# Patient Record
Sex: Female | Born: 1985 | Hispanic: No | Marital: Single | Smoking: Never smoker
Health system: Southern US, Community
[De-identification: ages and names within clinical notes are randomized; demographics above are authoritative.]

## PROBLEM LIST (undated history)

## (undated) ENCOUNTER — Inpatient Hospital Stay (HOSPITAL_COMMUNITY): Payer: Self-pay

## (undated) DIAGNOSIS — A499 Bacterial infection, unspecified: Secondary | ICD-10-CM

## (undated) DIAGNOSIS — N390 Urinary tract infection, site not specified: Secondary | ICD-10-CM

## (undated) DIAGNOSIS — O34219 Maternal care for unspecified type scar from previous cesarean delivery: Secondary | ICD-10-CM

## (undated) DIAGNOSIS — K219 Gastro-esophageal reflux disease without esophagitis: Secondary | ICD-10-CM

## (undated) DIAGNOSIS — D649 Anemia, unspecified: Secondary | ICD-10-CM

## (undated) DIAGNOSIS — F329 Major depressive disorder, single episode, unspecified: Secondary | ICD-10-CM

## (undated) DIAGNOSIS — O0991 Supervision of high risk pregnancy, unspecified, first trimester: Secondary | ICD-10-CM

## (undated) DIAGNOSIS — O358XX0 Maternal care for other (suspected) fetal abnormality and damage, not applicable or unspecified: Secondary | ICD-10-CM

## (undated) DIAGNOSIS — C801 Malignant (primary) neoplasm, unspecified: Secondary | ICD-10-CM

## (undated) DIAGNOSIS — R51 Headache: Secondary | ICD-10-CM

## (undated) DIAGNOSIS — E669 Obesity, unspecified: Secondary | ICD-10-CM

## (undated) DIAGNOSIS — T8859XA Other complications of anesthesia, initial encounter: Secondary | ICD-10-CM

## (undated) DIAGNOSIS — T4145XA Adverse effect of unspecified anesthetic, initial encounter: Secondary | ICD-10-CM

## (undated) DIAGNOSIS — I499 Cardiac arrhythmia, unspecified: Secondary | ICD-10-CM

## (undated) DIAGNOSIS — F419 Anxiety disorder, unspecified: Secondary | ICD-10-CM

## (undated) DIAGNOSIS — N189 Chronic kidney disease, unspecified: Secondary | ICD-10-CM

## (undated) DIAGNOSIS — O2620 Pregnancy care for patient with recurrent pregnancy loss, unspecified trimester: Secondary | ICD-10-CM

## (undated) DIAGNOSIS — F32A Depression, unspecified: Secondary | ICD-10-CM

## (undated) DIAGNOSIS — O35BXX0 Maternal care for other (suspected) fetal abnormality and damage, fetal cardiac anomalies, not applicable or unspecified: Secondary | ICD-10-CM

## (undated) DIAGNOSIS — N912 Amenorrhea, unspecified: Secondary | ICD-10-CM

## (undated) DIAGNOSIS — N3941 Urge incontinence: Secondary | ICD-10-CM

## (undated) DIAGNOSIS — O09299 Supervision of pregnancy with other poor reproductive or obstetric history, unspecified trimester: Secondary | ICD-10-CM

## (undated) DIAGNOSIS — R0602 Shortness of breath: Secondary | ICD-10-CM

## (undated) DIAGNOSIS — R000 Tachycardia, unspecified: Secondary | ICD-10-CM

## (undated) DIAGNOSIS — O99210 Obesity complicating pregnancy, unspecified trimester: Secondary | ICD-10-CM

## (undated) DIAGNOSIS — R590 Localized enlarged lymph nodes: Secondary | ICD-10-CM

## (undated) DIAGNOSIS — Z793 Long term (current) use of hormonal contraceptives: Secondary | ICD-10-CM

## (undated) HISTORY — DX: Localized enlarged lymph nodes: R59.0

## (undated) HISTORY — DX: Supervision of pregnancy with other poor reproductive or obstetric history, unspecified trimester: O09.299

## (undated) HISTORY — DX: Maternal care for other (suspected) fetal abnormality and damage, fetal cardiac anomalies, not applicable or unspecified: O35.BXX0

## (undated) HISTORY — DX: Supervision of high risk pregnancy, unspecified, first trimester: O09.91

## (undated) HISTORY — DX: Maternal care for unspecified type scar from previous cesarean delivery: O34.219

## (undated) HISTORY — PX: HERNIA REPAIR: SHX51

## (undated) HISTORY — PX: BARIATRIC SURGERY: SHX1103

## (undated) HISTORY — DX: Maternal care for other (suspected) fetal abnormality and damage, not applicable or unspecified: O35.8XX0

## (undated) HISTORY — DX: Amenorrhea, unspecified: N91.2

## (undated) HISTORY — DX: Obesity, unspecified: E66.9

## (undated) HISTORY — DX: Tachycardia, unspecified: R00.0

## (undated) HISTORY — DX: Long term (current) use of hormonal contraceptives: Z79.3

## (undated) HISTORY — DX: Urinary tract infection, site not specified: N39.0

## (undated) HISTORY — DX: Urge incontinence: N39.41

## (undated) HISTORY — DX: Obesity complicating pregnancy, unspecified trimester: O99.210

## (undated) HISTORY — DX: Pregnancy care for patient with recurrent pregnancy loss, unspecified trimester: O26.20

## (undated) HISTORY — PX: ABDOMINOPLASTY: SUR9

---
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
## 2003-04-16 ENCOUNTER — Encounter: Admission: RE | Admit: 2003-04-16 | Discharge: 2003-04-16 | Payer: Self-pay | Admitting: Family Medicine

## 2003-05-10 ENCOUNTER — Encounter (INDEPENDENT_AMBULATORY_CARE_PROVIDER_SITE_OTHER): Payer: Self-pay | Admitting: *Deleted

## 2003-06-11 ENCOUNTER — Encounter: Admission: RE | Admit: 2003-06-11 | Discharge: 2003-06-11 | Payer: Self-pay | Admitting: Family Medicine

## 2003-07-04 ENCOUNTER — Encounter: Admission: RE | Admit: 2003-07-04 | Discharge: 2003-07-04 | Payer: Self-pay | Admitting: Family Medicine

## 2003-10-03 ENCOUNTER — Encounter: Admission: RE | Admit: 2003-10-03 | Discharge: 2003-10-03 | Payer: Self-pay | Admitting: Sports Medicine

## 2003-10-03 ENCOUNTER — Ambulatory Visit: Payer: Self-pay | Admitting: *Deleted

## 2003-11-15 ENCOUNTER — Ambulatory Visit: Payer: Self-pay | Admitting: Sports Medicine

## 2003-12-23 ENCOUNTER — Ambulatory Visit: Payer: Self-pay | Admitting: Family Medicine

## 2003-12-30 ENCOUNTER — Ambulatory Visit: Payer: Self-pay | Admitting: Sports Medicine

## 2004-01-04 ENCOUNTER — Emergency Department (HOSPITAL_COMMUNITY): Admission: EM | Admit: 2004-01-04 | Discharge: 2004-01-04 | Payer: Self-pay | Admitting: Emergency Medicine

## 2004-01-08 ENCOUNTER — Ambulatory Visit: Payer: Self-pay | Admitting: Family Medicine

## 2004-01-08 ENCOUNTER — Ambulatory Visit (HOSPITAL_COMMUNITY): Admission: RE | Admit: 2004-01-08 | Discharge: 2004-01-08 | Payer: Self-pay | Admitting: Sports Medicine

## 2004-04-16 ENCOUNTER — Ambulatory Visit: Payer: Self-pay | Admitting: Sports Medicine

## 2004-08-27 ENCOUNTER — Ambulatory Visit: Payer: Self-pay | Admitting: Family Medicine

## 2004-09-07 ENCOUNTER — Ambulatory Visit: Payer: Self-pay | Admitting: Family Medicine

## 2004-11-05 ENCOUNTER — Ambulatory Visit: Payer: Self-pay | Admitting: Family Medicine

## 2004-11-09 ENCOUNTER — Ambulatory Visit: Payer: Self-pay | Admitting: Family Medicine

## 2004-12-25 ENCOUNTER — Ambulatory Visit: Payer: Self-pay | Admitting: Family Medicine

## 2005-03-10 ENCOUNTER — Ambulatory Visit: Payer: Self-pay | Admitting: Family Medicine

## 2005-04-02 ENCOUNTER — Ambulatory Visit: Payer: Self-pay | Admitting: Family Medicine

## 2005-08-24 ENCOUNTER — Ambulatory Visit: Payer: Self-pay | Admitting: Family Medicine

## 2005-08-26 ENCOUNTER — Emergency Department (HOSPITAL_COMMUNITY): Admission: EM | Admit: 2005-08-26 | Discharge: 2005-08-26 | Payer: Self-pay | Admitting: Emergency Medicine

## 2005-09-13 ENCOUNTER — Ambulatory Visit: Payer: Self-pay | Admitting: Family Medicine

## 2006-04-07 DIAGNOSIS — G43009 Migraine without aura, not intractable, without status migrainosus: Secondary | ICD-10-CM

## 2006-04-07 DIAGNOSIS — Z6841 Body Mass Index (BMI) 40.0 and over, adult: Secondary | ICD-10-CM | POA: Insufficient documentation

## 2006-04-08 ENCOUNTER — Encounter (INDEPENDENT_AMBULATORY_CARE_PROVIDER_SITE_OTHER): Payer: Self-pay | Admitting: *Deleted

## 2006-09-09 ENCOUNTER — Telehealth: Payer: Self-pay | Admitting: *Deleted

## 2006-09-09 ENCOUNTER — Ambulatory Visit: Payer: Self-pay | Admitting: Family Medicine

## 2006-10-22 ENCOUNTER — Emergency Department (HOSPITAL_COMMUNITY): Admission: EM | Admit: 2006-10-22 | Discharge: 2006-10-23 | Payer: Self-pay | Admitting: Emergency Medicine

## 2006-10-24 ENCOUNTER — Telehealth (INDEPENDENT_AMBULATORY_CARE_PROVIDER_SITE_OTHER): Payer: Self-pay | Admitting: *Deleted

## 2006-10-24 ENCOUNTER — Ambulatory Visit: Payer: Self-pay | Admitting: Family Medicine

## 2006-10-27 ENCOUNTER — Ambulatory Visit: Payer: Self-pay | Admitting: Family Medicine

## 2006-10-27 ENCOUNTER — Telehealth (INDEPENDENT_AMBULATORY_CARE_PROVIDER_SITE_OTHER): Payer: Self-pay | Admitting: *Deleted

## 2006-12-14 ENCOUNTER — Ambulatory Visit: Payer: Self-pay | Admitting: Family Medicine

## 2006-12-14 ENCOUNTER — Encounter (INDEPENDENT_AMBULATORY_CARE_PROVIDER_SITE_OTHER): Payer: Self-pay | Admitting: Family Medicine

## 2006-12-14 LAB — CONVERTED CEMR LAB
ALT: 16 units/L (ref 0–35)
Albumin: 4 g/dL (ref 3.5–5.2)
Calcium: 9.3 mg/dL (ref 8.4–10.5)
Creatinine, Ser: 0.72 mg/dL (ref 0.40–1.20)
Glucose, Bld: 78 mg/dL (ref 70–99)
MCV: 84.9 fL (ref 78.0–100.0)
RDW: 13.8 % (ref 11.5–14.0)
Sodium: 139 meq/L (ref 135–145)
WBC: 8.3 10*3/uL (ref 4.0–10.5)

## 2006-12-15 ENCOUNTER — Encounter (INDEPENDENT_AMBULATORY_CARE_PROVIDER_SITE_OTHER): Payer: Self-pay | Admitting: Family Medicine

## 2007-08-16 ENCOUNTER — Ambulatory Visit: Payer: Self-pay | Admitting: Family Medicine

## 2007-08-16 ENCOUNTER — Encounter: Payer: Self-pay | Admitting: *Deleted

## 2007-09-05 ENCOUNTER — Telehealth: Payer: Self-pay | Admitting: *Deleted

## 2007-09-05 ENCOUNTER — Ambulatory Visit: Payer: Self-pay | Admitting: Family Medicine

## 2007-11-24 ENCOUNTER — Encounter: Payer: Self-pay | Admitting: *Deleted

## 2007-12-01 ENCOUNTER — Encounter: Payer: Self-pay | Admitting: Family Medicine

## 2007-12-01 ENCOUNTER — Ambulatory Visit: Payer: Self-pay | Admitting: Family Medicine

## 2007-12-04 ENCOUNTER — Encounter: Payer: Self-pay | Admitting: Family Medicine

## 2007-12-04 ENCOUNTER — Telehealth (INDEPENDENT_AMBULATORY_CARE_PROVIDER_SITE_OTHER): Payer: Self-pay | Admitting: *Deleted

## 2007-12-04 LAB — CONVERTED CEMR LAB
Chlamydia, DNA Probe: NEGATIVE
GC Probe Amp, Genital: NEGATIVE

## 2007-12-06 ENCOUNTER — Encounter: Payer: Self-pay | Admitting: Family Medicine

## 2007-12-22 ENCOUNTER — Ambulatory Visit: Payer: Self-pay | Admitting: Family Medicine

## 2008-01-02 ENCOUNTER — Encounter: Payer: Self-pay | Admitting: Family Medicine

## 2008-01-03 ENCOUNTER — Encounter: Payer: Self-pay | Admitting: *Deleted

## 2008-01-08 ENCOUNTER — Encounter: Payer: Self-pay | Admitting: Family Medicine

## 2008-01-22 ENCOUNTER — Telehealth: Payer: Self-pay | Admitting: Family Medicine

## 2008-01-29 ENCOUNTER — Telehealth: Payer: Self-pay | Admitting: *Deleted

## 2008-01-29 ENCOUNTER — Encounter: Payer: Self-pay | Admitting: Family Medicine

## 2008-01-29 ENCOUNTER — Ambulatory Visit: Payer: Self-pay | Admitting: Family Medicine

## 2008-04-15 ENCOUNTER — Telehealth: Payer: Self-pay | Admitting: *Deleted

## 2008-05-17 ENCOUNTER — Ambulatory Visit: Payer: Self-pay | Admitting: Family Medicine

## 2008-06-24 ENCOUNTER — Emergency Department: Payer: Self-pay | Admitting: Emergency Medicine

## 2009-01-27 ENCOUNTER — Encounter: Payer: Self-pay | Admitting: Sports Medicine

## 2009-01-27 ENCOUNTER — Ambulatory Visit: Payer: Self-pay | Admitting: Family Medicine

## 2009-01-27 LAB — CONVERTED CEMR LAB
Antibody Screen: NEGATIVE
Basophils Absolute: 0 K/uL (ref 0.0–0.1)
Basophils Relative: 0 % (ref 0–1)
Eosinophils Absolute: 0.2 K/uL (ref 0.0–0.7)
Eosinophils Relative: 2 % (ref 0–5)
HCT: 41.5 % (ref 36.0–46.0)
Hemoglobin: 13.5 g/dL (ref 12.0–15.0)
Hepatitis B Surface Ag: NEGATIVE
Lymphocytes Relative: 17 % (ref 12–46)
Lymphs Abs: 1.8 K/uL (ref 0.7–4.0)
MCHC: 32.5 g/dL (ref 30.0–36.0)
MCV: 84.2 fL (ref 78.0–100.0)
Monocytes Absolute: 0.6 10*3/uL (ref 0.1–1.0)
Monocytes Relative: 5 % (ref 3–12)
Neutro Abs: 8.5 K/uL — ABNORMAL HIGH (ref 1.7–7.7)
Neutrophils Relative %: 77 % (ref 43–77)
Platelets: 243 K/uL (ref 150–400)
RBC: 4.93 M/uL (ref 3.87–5.11)
RDW: 14.5 % (ref 11.5–15.5)
Rh Type: POSITIVE
Rubella: 51.2 intl units/mL — ABNORMAL HIGH
Sickle Cell Screen: NEGATIVE
WBC: 11.1 10*3/microliter — ABNORMAL HIGH (ref 4.0–10.5)

## 2009-02-03 ENCOUNTER — Ambulatory Visit (HOSPITAL_COMMUNITY): Admission: RE | Admit: 2009-02-03 | Discharge: 2009-02-03 | Payer: Self-pay | Admitting: Family Medicine

## 2009-02-03 ENCOUNTER — Encounter: Payer: Self-pay | Admitting: Sports Medicine

## 2009-02-03 ENCOUNTER — Ambulatory Visit: Payer: Self-pay | Admitting: Family Medicine

## 2009-02-03 ENCOUNTER — Other Ambulatory Visit: Admission: RE | Admit: 2009-02-03 | Discharge: 2009-02-03 | Payer: Self-pay | Admitting: Sports Medicine

## 2009-02-03 LAB — CONVERTED CEMR LAB
Chlamydia, DNA Probe: NEGATIVE
GC Probe Amp, Genital: NEGATIVE
TSH: 0.868 microintl units/mL (ref 0.350–4.500)

## 2009-02-05 ENCOUNTER — Telehealth: Payer: Self-pay | Admitting: Sports Medicine

## 2009-02-05 ENCOUNTER — Encounter: Payer: Self-pay | Admitting: Sports Medicine

## 2009-02-13 ENCOUNTER — Encounter: Payer: Self-pay | Admitting: Sports Medicine

## 2009-02-13 ENCOUNTER — Ambulatory Visit: Payer: Self-pay | Admitting: Obstetrics & Gynecology

## 2009-03-08 ENCOUNTER — Emergency Department (HOSPITAL_COMMUNITY): Admission: EM | Admit: 2009-03-08 | Discharge: 2009-03-08 | Payer: Self-pay | Admitting: Emergency Medicine

## 2009-03-14 ENCOUNTER — Encounter: Payer: Self-pay | Admitting: Sports Medicine

## 2009-03-14 ENCOUNTER — Ambulatory Visit: Payer: Self-pay | Admitting: Family Medicine

## 2009-03-14 LAB — CONVERTED CEMR LAB
Bilirubin Urine: NEGATIVE
Blood in Urine, dipstick: NEGATIVE
Glucose, Urine, Semiquant: NEGATIVE
Ketones, urine, test strip: NEGATIVE
Nitrite: NEGATIVE
Protein, U semiquant: NEGATIVE
Specific Gravity, Urine: 1.02
Urobilinogen, UA: 0.2
WBC Urine, dipstick: NEGATIVE
pH: 7.5

## 2009-03-27 ENCOUNTER — Ambulatory Visit: Payer: Self-pay | Admitting: Family Medicine

## 2009-03-27 LAB — CONVERTED CEMR LAB
Bilirubin Urine: NEGATIVE
Blood in Urine, dipstick: NEGATIVE
Glucose, Urine, Semiquant: NEGATIVE
Ketones, urine, test strip: NEGATIVE
Nitrite: NEGATIVE
Protein, U semiquant: NEGATIVE
Specific Gravity, Urine: 1.015
Urobilinogen, UA: 0.2
WBC Urine, dipstick: NEGATIVE
pH: 6.5

## 2009-04-09 ENCOUNTER — Encounter: Payer: Self-pay | Admitting: Sports Medicine

## 2009-04-09 ENCOUNTER — Ambulatory Visit: Payer: Self-pay | Admitting: Family Medicine

## 2009-04-09 DIAGNOSIS — N390 Urinary tract infection, site not specified: Secondary | ICD-10-CM | POA: Insufficient documentation

## 2009-04-09 LAB — CONVERTED CEMR LAB
ALT: 65 units/L — ABNORMAL HIGH (ref 0–35)
AST: 36 U/L (ref 0–37)
Albumin: 3.1 g/dL — ABNORMAL LOW (ref 3.5–5.2)
Alkaline Phosphatase: 84 U/L (ref 39–117)
BUN: 10 mg/dL (ref 6–23)
CO2: 23 meq/L (ref 19–32)
Calcium: 8.5 mg/dL (ref 8.4–10.5)
Chloride: 103 meq/L (ref 96–112)
Creatinine, Ser: 0.6 mg/dL (ref 0.40–1.20)
Glucose, Bld: 79 mg/dL (ref 70–99)
HCT: 36.6 % (ref 36.0–46.0)
Hemoglobin: 12.2 g/dL (ref 12.0–15.0)
MCHC: 33.3 g/dL (ref 30.0–36.0)
MCV: 85.1 fL (ref 78.0–100.0)
Platelets: 193 K/uL (ref 150–400)
Potassium: 3.8 meq/L (ref 3.5–5.3)
RBC: 4.3 M/uL (ref 3.87–5.11)
RDW: 14.2 % (ref 11.5–15.5)
Sodium: 136 meq/L (ref 135–145)
Total Bilirubin: 0.3 mg/dL (ref 0.3–1.2)
Total Protein: 6.6 g/dL (ref 6.0–8.3)
WBC: 10.2 10*3/uL (ref 4.0–10.5)

## 2009-04-16 ENCOUNTER — Ambulatory Visit (HOSPITAL_COMMUNITY): Admission: RE | Admit: 2009-04-16 | Discharge: 2009-04-16 | Payer: Self-pay | Admitting: Sports Medicine

## 2009-04-16 ENCOUNTER — Encounter: Payer: Self-pay | Admitting: Sports Medicine

## 2009-05-08 ENCOUNTER — Ambulatory Visit: Payer: Self-pay | Admitting: Family Medicine

## 2009-05-14 ENCOUNTER — Ambulatory Visit: Payer: Self-pay | Admitting: Diagnostic Radiology

## 2009-05-14 ENCOUNTER — Emergency Department (HOSPITAL_BASED_OUTPATIENT_CLINIC_OR_DEPARTMENT_OTHER): Admission: EM | Admit: 2009-05-14 | Discharge: 2009-05-14 | Payer: Self-pay | Admitting: Emergency Medicine

## 2009-05-19 ENCOUNTER — Encounter: Payer: Self-pay | Admitting: Sports Medicine

## 2009-05-19 ENCOUNTER — Ambulatory Visit: Payer: Self-pay | Admitting: Family Medicine

## 2009-05-19 LAB — CONVERTED CEMR LAB
Bilirubin Urine: NEGATIVE
Blood in Urine, dipstick: NEGATIVE
Glucose, Urine, Semiquant: NEGATIVE
Ketones, urine, test strip: NEGATIVE
Nitrite: NEGATIVE
Protein, U semiquant: NEGATIVE
Specific Gravity, Urine: 1.02
Urobilinogen, UA: 0.2
pH: 7

## 2009-05-26 ENCOUNTER — Encounter: Payer: Self-pay | Admitting: Sports Medicine

## 2009-05-29 ENCOUNTER — Ambulatory Visit: Payer: Self-pay | Admitting: Family Medicine

## 2009-05-31 ENCOUNTER — Emergency Department (HOSPITAL_COMMUNITY): Admission: EM | Admit: 2009-05-31 | Discharge: 2009-05-31 | Payer: Self-pay | Admitting: Family Medicine

## 2009-06-11 ENCOUNTER — Ambulatory Visit: Payer: Self-pay | Admitting: Family Medicine

## 2009-06-11 ENCOUNTER — Encounter: Payer: Self-pay | Admitting: Sports Medicine

## 2009-06-11 LAB — CONVERTED CEMR LAB
HCT: 36.2 % (ref 36.0–46.0)
Hemoglobin: 11.4 g/dL — ABNORMAL LOW (ref 12.0–15.0)
MCHC: 31.5 g/dL (ref 30.0–36.0)
MCV: 83.2 fL (ref 78.0–100.0)
Platelets: 201 K/uL (ref 150–400)
RBC: 4.35 M/uL (ref 3.87–5.11)
RDW: 13.7 % (ref 11.5–15.5)
WBC: 10.5 10*3/microliter (ref 4.0–10.5)

## 2009-06-12 ENCOUNTER — Encounter: Payer: Self-pay | Admitting: Sports Medicine

## 2009-06-12 ENCOUNTER — Ambulatory Visit (HOSPITAL_COMMUNITY): Admission: RE | Admit: 2009-06-12 | Discharge: 2009-06-12 | Payer: Self-pay | Admitting: Family Medicine

## 2009-06-27 ENCOUNTER — Telehealth: Payer: Self-pay | Admitting: Sports Medicine

## 2009-06-27 ENCOUNTER — Inpatient Hospital Stay (HOSPITAL_COMMUNITY): Admission: AD | Admit: 2009-06-27 | Discharge: 2009-06-27 | Payer: Self-pay | Admitting: Obstetrics & Gynecology

## 2009-06-27 ENCOUNTER — Ambulatory Visit: Payer: Self-pay | Admitting: Family Medicine

## 2009-06-30 ENCOUNTER — Telehealth: Payer: Self-pay | Admitting: Sports Medicine

## 2009-07-01 ENCOUNTER — Telehealth: Payer: Self-pay | Admitting: Sports Medicine

## 2009-07-04 ENCOUNTER — Ambulatory Visit: Payer: Self-pay | Admitting: Family Medicine

## 2009-07-09 ENCOUNTER — Telehealth: Payer: Self-pay | Admitting: *Deleted

## 2009-07-18 ENCOUNTER — Ambulatory Visit: Payer: Self-pay | Admitting: Family Medicine

## 2009-07-21 ENCOUNTER — Telehealth: Payer: Self-pay | Admitting: *Deleted

## 2009-07-23 ENCOUNTER — Ambulatory Visit (HOSPITAL_COMMUNITY): Admission: RE | Admit: 2009-07-23 | Discharge: 2009-07-23 | Payer: Self-pay | Admitting: Family Medicine

## 2009-07-23 ENCOUNTER — Encounter: Payer: Self-pay | Admitting: Sports Medicine

## 2009-07-30 ENCOUNTER — Ambulatory Visit: Payer: Self-pay | Admitting: Obstetrics and Gynecology

## 2009-07-31 ENCOUNTER — Encounter: Payer: Self-pay | Admitting: Sports Medicine

## 2009-07-31 ENCOUNTER — Ambulatory Visit: Payer: Self-pay | Admitting: Family Medicine

## 2009-07-31 LAB — CONVERTED CEMR LAB
Chlamydia, DNA Probe: NEGATIVE
GC Probe Amp, Genital: NEGATIVE

## 2009-08-05 ENCOUNTER — Ambulatory Visit: Payer: Self-pay | Admitting: Family Medicine

## 2009-08-06 ENCOUNTER — Encounter: Payer: Self-pay | Admitting: Sports Medicine

## 2009-08-06 ENCOUNTER — Telehealth: Payer: Self-pay | Admitting: *Deleted

## 2009-08-12 ENCOUNTER — Telehealth: Payer: Self-pay | Admitting: Sports Medicine

## 2009-08-13 ENCOUNTER — Ambulatory Visit: Payer: Self-pay | Admitting: Family Medicine

## 2009-08-14 ENCOUNTER — Encounter: Payer: Self-pay | Admitting: Sports Medicine

## 2009-08-14 ENCOUNTER — Ambulatory Visit: Payer: Self-pay | Admitting: Family Medicine

## 2009-08-14 LAB — CONVERTED CEMR LAB
Bilirubin Urine: NEGATIVE
Blood in Urine, dipstick: NEGATIVE
Glucose, Urine, Semiquant: NEGATIVE
Ketones, urine, test strip: NEGATIVE
Nitrite: NEGATIVE
Protein, U semiquant: NEGATIVE
Specific Gravity, Urine: 1.025
Urobilinogen, UA: 1
pH: 6

## 2009-08-15 ENCOUNTER — Telehealth (INDEPENDENT_AMBULATORY_CARE_PROVIDER_SITE_OTHER): Payer: Self-pay | Admitting: Family Medicine

## 2009-08-21 ENCOUNTER — Ambulatory Visit: Payer: Self-pay | Admitting: Family Medicine

## 2009-08-25 ENCOUNTER — Inpatient Hospital Stay (HOSPITAL_COMMUNITY): Admission: AD | Admit: 2009-08-25 | Discharge: 2009-08-27 | Payer: Self-pay | Admitting: Obstetrics & Gynecology

## 2009-08-25 ENCOUNTER — Ambulatory Visit: Payer: Self-pay | Admitting: Family Medicine

## 2009-08-25 ENCOUNTER — Ambulatory Visit: Payer: Self-pay | Admitting: Obstetrics & Gynecology

## 2009-08-27 ENCOUNTER — Encounter: Payer: Self-pay | Admitting: Sports Medicine

## 2009-08-29 ENCOUNTER — Encounter: Payer: Self-pay | Admitting: Sports Medicine

## 2009-08-29 ENCOUNTER — Encounter: Admission: RE | Admit: 2009-08-29 | Discharge: 2009-09-28 | Payer: Self-pay | Admitting: Sports Medicine

## 2009-08-29 ENCOUNTER — Ambulatory Visit: Admission: RE | Admit: 2009-08-29 | Discharge: 2009-08-29 | Payer: Self-pay | Admitting: Sports Medicine

## 2009-08-29 ENCOUNTER — Telehealth: Payer: Self-pay | Admitting: Family Medicine

## 2009-09-01 ENCOUNTER — Ambulatory Visit: Admission: RE | Admit: 2009-09-01 | Discharge: 2009-09-01 | Payer: Self-pay | Admitting: Sports Medicine

## 2009-09-29 ENCOUNTER — Encounter: Admission: RE | Admit: 2009-09-29 | Discharge: 2009-10-29 | Payer: Self-pay | Admitting: Sports Medicine

## 2009-10-06 ENCOUNTER — Ambulatory Visit: Payer: Self-pay | Admitting: Family Medicine

## 2009-10-07 ENCOUNTER — Encounter: Payer: Self-pay | Admitting: Sports Medicine

## 2009-10-22 ENCOUNTER — Emergency Department (HOSPITAL_COMMUNITY): Admission: EM | Admit: 2009-10-22 | Discharge: 2009-10-22 | Payer: Self-pay | Admitting: Family Medicine

## 2009-10-27 ENCOUNTER — Ambulatory Visit: Payer: Self-pay | Admitting: Family Medicine

## 2009-10-27 ENCOUNTER — Encounter: Payer: Self-pay | Admitting: Sports Medicine

## 2009-10-27 LAB — CONVERTED CEMR LAB
Beta hcg, urine, semiquantitative: NEGATIVE
Bilirubin Urine: NEGATIVE
Glucose, Urine, Semiquant: NEGATIVE
Ketones, urine, test strip: NEGATIVE
Nitrite: NEGATIVE
Protein, U semiquant: NEGATIVE
Specific Gravity, Urine: 1.02
Urobilinogen, UA: 0.2
pH: 6

## 2009-10-30 ENCOUNTER — Encounter: Admission: RE | Admit: 2009-10-30 | Discharge: 2009-10-30 | Payer: Self-pay | Admitting: Sports Medicine

## 2009-11-13 ENCOUNTER — Ambulatory Visit: Payer: Self-pay | Admitting: Family Medicine

## 2009-11-30 ENCOUNTER — Encounter
Admission: RE | Admit: 2009-11-30 | Discharge: 2009-12-30 | Payer: Self-pay | Source: Home / Self Care | Admitting: Sports Medicine

## 2009-12-10 ENCOUNTER — Ambulatory Visit: Payer: Self-pay | Admitting: Obstetrics and Gynecology

## 2009-12-15 ENCOUNTER — Encounter: Payer: Self-pay | Admitting: Sports Medicine

## 2009-12-26 ENCOUNTER — Ambulatory Visit: Payer: Self-pay | Admitting: Family Medicine

## 2009-12-31 ENCOUNTER — Encounter
Admission: RE | Admit: 2009-12-31 | Discharge: 2010-01-30 | Payer: Self-pay | Source: Home / Self Care | Attending: Sports Medicine | Admitting: Sports Medicine

## 2010-01-15 ENCOUNTER — Ambulatory Visit (HOSPITAL_COMMUNITY): Admission: RE | Admit: 2010-01-15 | Payer: Self-pay | Source: Home / Self Care | Admitting: Obstetrics & Gynecology

## 2010-01-31 ENCOUNTER — Encounter
Admission: RE | Admit: 2010-01-31 | Discharge: 2010-03-02 | Payer: Self-pay | Source: Home / Self Care | Attending: Sports Medicine | Admitting: Sports Medicine

## 2010-02-08 HISTORY — PX: ANKLE SURGERY: SHX546

## 2010-03-01 ENCOUNTER — Encounter: Payer: Self-pay | Admitting: Family Medicine

## 2010-03-03 ENCOUNTER — Encounter
Admission: RE | Admit: 2010-03-03 | Discharge: 2010-03-10 | Payer: Self-pay | Source: Home / Self Care | Attending: Sports Medicine | Admitting: Sports Medicine

## 2010-03-12 NOTE — Assessment & Plan Note (Signed)
Summary: diarrhea & nausea/[place]/T   Vital Signs:  Patient profile:   25 year old female Height:      64 inches Weight:      344 pounds Temp:     98.2 degrees F oral Pulse rate:   90 / minute BP sitting:   122 / 78  (left arm) Cuff size:   large  Vitals Entered By: Tessie Fass CMA (August 13, 2009 9:01 AM) CC: nausea, vomiting, diarrhea, lower back and abdominal pain x 3 days Is Patient Diabetic? No Pain Assessment Patient in pain? yes     Location: lower back, abdomen Intensity: 4   Primary Care Provider:  Rodney Langton MD  CC:  nausea, vomiting, diarrhea, and lower back and abdominal pain x 3 days.  History of Present Illness: 15 F at 27 6/[redacted] weeks  EGA p/w   1) Nausea, vomiting and diarrhea: x 3 days. Emesis (non bloody, non bilious) 3 times per day when tries to eat or drink. Not relieved by phenergan (makes her sleepy). Loose stools 3 times per day (non bloody).  Able to keep down liquids, had potato last night, kept that down as well. Mild diffuse abdominal pain when has emesis. Reports +ve fetal movement, denies vaginal bleeding, contractions, vaginal discharge, dysuria, hematuria, focal abdominal pain, sick contacts, new foods, LE edema, headache, vision change. Has mild nausea right now but does not want any medications for this while here in the office. Reports that she cannot urinate at this time to provide a urine sample. 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   see prior meds for med rec.   Habits & Providers  Alcohol-Tobacco-Diet     Cigarette Packs/Day: n/a  Allergies (verified): No Known Drug Allergies  Review of Systems       as per HPI o/w negative   Physical Exam  General:  morbidly obese, pleasant, NAD  vitals reviewed.  Mouth:  moist membranes  Lungs:  Normal respiratory effort, chest expands symmetrically. Lungs are clear to auscultation, no crackles or wheezes. Heart:  Normal rate and regular rhythm. S1 and S2 normal without gallop, murmur,  click, rub or other extra sounds. Abdomen:  Obese, gravid, appropriate for EGA, non tender, +BS    Impression & Recommendations:  Problem # 1:  GASTROENTERITIS, VIRAL (ICD-008.8) Assessment New  Likely cause of symptoms. Advised regarding symptomatic care, red flags including pregnancy / labor precautions. Advised to push fluids. Will add Zofran for symptosm relief. Follow up with PCP on Thursday.   Orders: [facility]- Est Level  3 (16109)  Complete Medication List: 1)  Promethazine Hcl 25 Mg Tabs (Promethazine hcl) .... One three times a day as needed for nausea associated with migranes. 2)  Calcium 600/vitamin D 600-400 Mg-unit Chew (Calcium carbonate-vitamin d) .... One tablet by mouth two times a day 3)  Prenatal Vitamins 0.8 Mg Tabs (Prenatal multivit-min-fe-fa) .... One tab by mouth daily 4)  Cephalexin 250 Mg Tabs (Cephalexin) .... One tab by mouth daily until end of pregnancy 5)  Auralgan Soln (Antipyrine-benzocaine-polycos) .... 2 drops in painful ear qid. 6)  Acetaminophen 650 Mg Cr-tabs (Acetaminophen) .... One tab by mouth three times a day 7)  Ondansetron Hcl 4 Mg Tabs (Ondansetron hcl) .... One tab by mouth q4hrs as needed for nausea, vomiting  Patient Instructions: 1)  It was great to see you today!  2)  Come back for your regular appointment 3)  If you have vaginal bleeding, contractions that are consistent, or any other  concerns, please go to the MAU or the Clinic.  Every morning and every evening evaluate whether you think the baby is moving enough.  If not, then do kick counts as follows.  Sit in a quiet place and count each movement.  If you get to 5 baby movements in an hour you can stop.  If you have less than 5 movements, keep counting for another hour.  If you don't get 10 movements in 2 hours, you should go to the MAU or call the clinic. 4)  Stay well hydrated. 5)  If you develop fevers, worsening diarrhea or vomiting, come back to be seen.  Prescriptions: ONDANSETRON  HCL 4 MG TABS (ONDANSETRON HCL) one tab by mouth q4hrs as needed for nausea, vomiting  #15 x 1   Entered and Authorized by:   Bobby Rumpf  MD   Signed by:   Bobby Rumpf  MD on 08/13/2009   Method used:   Electronically to        [facility] [phone]* (retail)       [address]       [place], [place]  [address]       Ph: [phone]       Fax: [phone]   RxID:   [id number]      Flowsheet View for Follow-up Visit    Estimated weeks of       gestation:     36 6/7    Weight:     344    Blood pressure:   122 / 78    Hx headache?     No    Nausea/vomiting?   see above     Edema?     0    Bleeding?     no    Leakage/discharge?   no    Fetal activity:       yes    Labor symptoms?   no    Fundal height:      37.5    FHR:       125-135    Fetal position:      ??    Taking Vitamins?   Y    Smoking PPD:   n/a    Comment:     see above assessment    Next visit:     on Thursday at scheduled appointment    Resident:     Wallene Huh

## 2010-03-12 NOTE — Assessment & Plan Note (Signed)
Summary: OB/KH   Vital Signs:  Patient profile:   25 year old female Weight:      335.4 pounds Temp:     98.6 degrees F oral Pulse rate:   94 / minute Pulse rhythm:   regular BP sitting:   131 / 82  (left arm) Cuff size:   large  Vitals Entered By: Loralee Pacas CMA (Jun 11, 2009 1:35 PM) CC: ob  Comments pt went to [facility] for ear infection and the medication is not working for her   Primary Care Provider:  Rodney Langton MD  CC:  ob .  History of Present Illness: 04VW [id number] here for ROB visit. 27.6 weeks.  See flowsheet and A/P.  Habits & Providers  Alcohol-Tobacco-Diet     Cigarette Packs/Day: n/a  Current Medications (verified): 1)  Promethazine Hcl 25 Mg Tabs (Promethazine Hcl) .... One Three Times A Day As Needed For Nausea Associated With Migranes. 2)  Calcium 600/vitamin D 600-400 Mg-Unit Chew (Calcium Carbonate-Vitamin D) .... One Tablet By Mouth Two Times A Day 3)  Prenatal Vitamins 0.8 Mg Tabs (Prenatal Multivit-Min-Fe-Fa) .... One Tab By Mouth Daily 4)  Cephalexin 250 Mg Tabs (Cephalexin) .... One Tab By Mouth Daily Until End of Pregnancy 5)  Auralgan  Soln (Antipyrine-Benzocaine-Polycos) .... 2 Drops in Painful Ear Qid.  Allergies (verified): No Known Drug Allergies  Review of Systems       See HPI  Physical Exam  General:  Well-developed,well-nourished,in no acute distress; alert,appropriate and cooperative throughout examination Ears:  External ear exam shows no significant lesions or deformities.  Otoscopic examination reveals clear canals, tympanic membranes are intact bilaterally without bulging, retraction, inflammation or discharge. Hearing is grossly normal bilaterally. Abdomen:  Obese, gravid, appropriate for EGA.   Impression & Recommendations:  Problem # 1:  PREGNANCY WITH OTHER POOR REPRODUCTIVE HISTORY (ICD-V23.5) Assessment Unchanged 25yo [id number] at 27.6 weeks Initiated prenatal care at 10 weeks. Refused genetic screening. Best  EDC: 09/04/2009 by 9wk [place] Hb: 13.5  Plt:243 HIV: neg RPR: NR Hep B: neg Rubella: Immune Blood type: O pos  Ab: neg Sickle Screen: neg Dating [place]: Consistent with LMP. GC/Chlam/PAP: neg/neg/neg 1h Glucola: 98/108 Taking PNV. Anatomy scan female, normal. Fetal ECHO Normal  Referred to [facility] for 24wk stillborn, SABx2->needs [place] at 20, 28, 34 weeks.  Fetal ECHO after 20wk anatomy scan. Desires RLTCS, needs to be set up near term (30 wks).  Has [place] scheduled tomorrow.  Needs another at 34 weeks. CBC, HIV, RPR today.  RTC 2 weeks.  Orders: Glucose 1 hr-[facility] (82950) CBC-[facility] (47829) HIV-[facility] (56213-08657) RPR-[facility] (84696-29528) [facility]- Est  Level 4 (41324)  Problem # 2:  OTITIS EXTERNA, ACUTE, RIGHT (ICD-380.12) Assessment: Improved Seen at [facility] 4 days ago and dx AOE, rx antibiotic ear drops.  Resolved but still painful, auralgan for symptomatic relief.  Her updated medication list for this problem includes:    Auralgan Soln (Antipyrine-benzocaine-polycos) .Marland Kitchen... 2 drops in painful ear qid.  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  Refilled.  Her updated medication list for this problem includes:    Cephalexin 250 Mg Tabs (Cephalexin) ..... One tab by mouth daily until end of pregnancy  Orders: [facility]- Est  Level 4 (40102)  Complete Medication List: 1)  Promethazine Hcl 25 Mg Tabs (Promethazine hcl) .... One three times a day as needed for nausea associated with migranes. 2)  Calcium 600/vitamin D  600-400 Mg-unit Chew (Calcium carbonate-vitamin d) .... One tablet by mouth two times a day 3)  Prenatal Vitamins 0.8 Mg Tabs (Prenatal multivit-min-fe-fa) .... One tab by mouth daily 4)  Cephalexin 250 Mg Tabs (Cephalexin) .... One tab by mouth daily until end of pregnancy 5)  Auralgan Soln (Antipyrine-benzocaine-polycos) .... 2 drops in painful ear qid.  Patient Instructions: 1)  Great to  see you today, 2)  Bloodwork 3)  Call if you have any vaginal bleeding. 4)  Be sure to take your prenatal vitamins daily. 5)  Ultrasound tomorrow 6)  Refilled keflex 7)  Sent auralgan for ear. 8)  Come back to see me in 2 weeks. 9)  -Dr. Karie Schwalbe. Prescriptions: CEPHALEXIN 250 MG TABS (CEPHALEXIN) One tab by mouth daily until end of pregnancy  #90 x 0   Entered and Authorized by:   Rodney Langton MD   Signed by:   Rodney Langton MD on 06/11/2009   Method used:   Electronically to        [facility] [phone]* (retail)       [address]       [place], [place]  [address]       Ph: [phone]       Fax: [phone]   RxID:   [id number] AURALGAN  SOLN (ANTIPYRINE-BENZOCAINE-POLYCOS) 2 drops in painful ear QID.  #1 bottle x 0   Entered and Authorized by:   Rodney Langton MD   Signed by:   Rodney Langton MD on 06/11/2009   Method used:   Electronically to        [facility] [phone]* (retail)       [address]       [place], [place]  [address]       Ph: [phone]       Fax: [phone]   RxID:   [id number]        Flowsheet View for Follow-up Visit    Estimated weeks of       gestation:     27 6/7    Weight:     335.4    Blood pressure:   131 / 82    Hx headache?     No    Nausea/vomiting?   No    Edema?     0    Bleeding?     no    Leakage/discharge?   no    Fetal activity:       yes    Labor symptoms?   no    Fundal height:      29    FHR:       155    Fetal position:      ??    Taking Vitamins?   Y    Smoking PPD:   n/a    Comment:     Doing well, one episode of spotting that resolved.  [place] tomorrow.    Next visit:     2 wk    Resident:     Benjamin Stain    Preceptor:     Leveda Anna

## 2010-03-12 NOTE — Assessment & Plan Note (Signed)
Summary: cough,df   Vital Signs:  Patient profile:   25 year old female Height:      64 inches Weight:      326 pounds BMI:     56.16 Temp:     98.4 degrees F oral Pulse rate:   103 / minute BP sitting:   138 / 95  (right arm) Cuff size:   large  Vitals Entered By: Jimmy Footman, CMA (December 26, 2009 11:25 AM) CC: wet cough x3 weeks Is Patient Diabetic? No Pain Assessment Patient in pain? no      Comments robitussin---- not helping   Primary Care [place] Toure:  Rodney Langton MD  CC:  wet cough x3 weeks.  History of Present Illness: 25 yo female with cough.  Present 3 wks.  getting worse.  associated with post-tussive emesis occasionally.  Symptoms started with URI-type syndrome with ST 3 wks ago.  No HA, facial pain, ear pain, N/D/C, fevers/chills, rash, No sick contacts, no neck pain, no body aches, Pt has been using robitussin which doesn't help.  No SOB, no wheeze.  No throat clearing, no heartburn, no hoarse voice, not worse when laying flat or waking up in the morning, no sour brash, no epigastric pain.  Current Medications (verified): 1)  Calcium 600/vitamin D 600-400 Mg-Unit Chew (Calcium Carbonate-Vitamin D) .... One Tablet By Mouth Two Times A Day 2)  Ferrous Sulfate 325 (65 Fe) Mg Tbec (Ferrous Sulfate) .... One Tab By Mouth Two Times A Day 3)  Reglan 10 Mg Tabs (Metoclopramide Hcl) .... One Tab By Mouth Three Times A Day 4)  Depo-Provera 150 Mg/ml Susp (Medroxyprogesterone Acetate) .... Injected Im Q3 Months. 5)  Tessalon 200 Mg Caps (Benzonatate) .... One By Mouth Three Times A Day  Allergies (verified): No Known Drug Allergies  Review of Systems       See HPI  Physical Exam  General:  Well-developed,well-nourished,in no acute distress; alert,appropriate and cooperative throughout examination Head:  Normocephalic and atraumatic without obvious abnormalities. Eyes:  No corneal or conjunctival inflammation noted. EOMI. Perrl Ears:  External ear exam  shows no significant lesions or deformities.  Otoscopic examination reveals clear canals, tympanic membranes are intact bilaterally without bulging, retraction, inflammation or discharge. Hearing is grossly normal bilaterally. Nose:  External nasal examination shows no deformity or inflammation. Nasal mucosa are pink and moist without lesions or exudates. Mouth:  pharynx pink and moist, no erythema, no exudates, and no posterior lymphoid hypertrophy.  Some post-nasal drip present. Neck:  No deformities, masses, or tenderness noted. Lungs:  Normal respiratory effort, chest expands symmetrically. Lungs are clear to auscultation, no crackles or wheezes. Heart:  Normal rate and regular rhythm. S1 and S2 normal without gallop, murmur, click, rub or other extra sounds.   Impression & Recommendations:  Problem # 1:  DRY COUGH (ICD-786.2) Assessment New Likely post-infectious cough which can last 4 weeks. Symptoms not suggestive of GERD. Changed antitussive to Tessalon 200 three times a day. Hydration. Vicks. Humidifier. RTC if not starting to get better in 1-2 weeks.  Orders: [facility]- Est Level  3 (16109)  Complete Medication List: 1)  Calcium 600/vitamin D 600-400 Mg-unit Chew (Calcium carbonate-vitamin d) .... One tablet by mouth two times a day 2)  Ferrous Sulfate 325 (65 Fe) Mg Tbec (Ferrous sulfate) .... One tab by mouth two times a day 3)  Reglan 10 Mg Tabs (Metoclopramide hcl) .... One tab by mouth three times a day 4)  Depo-provera 150 Mg/ml Susp (Medroxyprogesterone acetate) .Marland KitchenMarland KitchenMarland Kitchen  Injected im q3 months. 5)  Tessalon 200 Mg Caps (Benzonatate) .... One by mouth three times a day  Patient Instructions: 1)  Post-infectious cough. 2)  Try the tessalon perles 200mg  three times a day. 3)  If no better in 1-2 weeks then come back to see me and we will discuss other causes of chronic cough. 4)  Keep well hydrated so the lining of your throat doesn't dry out. 5)  -Dr.  Karie Schwalbe. Prescriptions: TESSALON 200 MG CAPS (BENZONATATE) One by mouth three times a day  #60 x 0   Entered and Authorized by:   Rodney Langton MD   Signed by:   Rodney Langton MD on 12/26/2009   Method used:   Print then Give to Patient   RxID:   [id number]    Orders Added: 1)  [facility] Est Level  3 [13086]

## 2010-03-12 NOTE — Miscellaneous (Signed)
Summary: call form pharmacy  Clinical Lists Changes call form pharmacy Dr.Thekkekandam left message for refill on percocet on their voicemail. RX must be written. will forward to MD. Theresia Lo RN  August 27, 2009 11:45 AM  Will print out and fax. Rodney Langton MD  August 27, 2009 11:59 AM     Prescriptions: PERCOCET 5-325 MG TABS (OXYCODONE-ACETAMINOPHEN) One tab by mouth q6h as needed for pain.  #40 x 0   Entered and Authorized by:   Rodney Langton MD   Signed by:   Rodney Langton MD on 08/27/2009   Method used:   Reprint   RxID:   [id number]

## 2010-03-12 NOTE — Miscellaneous (Signed)
  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

## 2010-03-12 NOTE — Assessment & Plan Note (Signed)
Summary: OB 33.1/KH   Vital Signs:  Patient profile:   25 year old female Weight:      344.3 pounds Temp:     98 degrees F oral Pulse rate:   118 / minute Pulse rhythm:   regular BP sitting:   119 / 81  (right arm) Cuff size:   large  Vitals Entered By: Loralee Pacas CMA (July 18, 2009 2:22 PM) CC: ob   CC:  ob.  Habits & Providers  Alcohol-Tobacco-Diet     Tobacco Status: never     Cigarette Packs/Day: n/a  Allergies: No Known Drug Allergies   Impression & Recommendations:  Problem # 1:  PREGNANCY WITH OTHER POOR REPRODUCTIVE HISTORY (ICD-V23.5)  25yo [id number] at 33.1 weeks Initiated prenatal care at 10 weeks. Refused genetic screening. Best EDC: 09/04/2009 by 9wk [place] Hb: 13.5/11  Plt:243 HIV: neg/Neg RPR: NR/NR Hep B: neg Rubella: Immune Blood type: O pos  Ab: neg Sickle Screen: neg Dating [place]: Consistent with LMP. GC/Chlam/PAP: neg/neg/neg 1h Glucola: 98/108 Taking PNV. Anatomy scan female, normal. Fetal ECHO Normal 28wk extra [place] normal  Referred to [facility] for 24wk stillborn, SABx2->needs [place] at 20, 28, 34 weeks.  Fetal ECHO after 20wk anatomy scan.  Today we scheduled ultrasound today for 34 weeks. Today we scheduled visit with [facility] today for repeat C/S consultation and to set date.  GBS/GC/Chlam at 36 wks  RTC 2 weeks.  discussed PTL precautions.  Patient continues to have pelvic pain, round ligament pain, back pain likely associated with increasing joint laxity due to pregnancy.   Has already had manipulation, does not like tylenol.  Offered reassurance and further manipulation if desired.  Checked cervix as patient unsure if this pain may represent PTL.  Cervix closed.   Orders: Other OB visit- [facility] ([facility])  Complete Medication List: 1)  Promethazine Hcl 25 Mg Tabs (Promethazine hcl) .... One three times a day as needed for nausea associated with migranes. 2)  Calcium 600/vitamin D 600-400 Mg-unit Chew (Calcium carbonate-vitamin d) .... One tablet by  mouth two times a day 3)  Prenatal Vitamins 0.8 Mg Tabs (Prenatal multivit-min-fe-fa) .... One tab by mouth daily 4)  Cephalexin 250 Mg Tabs (Cephalexin) .... One tab by mouth daily until end of pregnancy 5)  Auralgan Soln (Antipyrine-benzocaine-polycos) .... 2 drops in painful ear qid. 6)  Acetaminophen 650 Mg Cr-tabs (Acetaminophen) .... One tab by mouth three times a day  Other Orders: Prenatal U/S > 14 weeks - 11914 (Prenatal U/S)  Patient Instructions: 1)  Make follow-up appt with Dr. Karie Schwalbe in 2 weeks. 2)  Will schedule appt to discuss schedule repeat c/s 3)  Will schedule ultrasound for 34 weeks. 4)  Preterm labor precautions:  [facility] or call office for evaluation if you notice increased or change in vaginal discharge, contractions that do not go away or happen more than 4 times an hour, loss of lfuids, vaginal bleeding. 5)  Kick Counts:  If you notice decreased movements of baby, count 5 kicks in 30 minutes.  If not, have a small snack and count for 30 more minutes.  If not 10 kicks in 1 hour, call MD or go to [facility]. 6)  Go to [facility] if you notice vaginal bleeding, loss of fluid, or contractions every 5-10 minutes lasting an hour and getting closer together or very strong. 7)  Take prenatal vitamins.    Flowsheet View for Follow-up Visit    Estimated weeks of  gestation:     33 1/7    Weight:     344.3    Blood pressure:   119 / 81    Headache:     No    Nausea/vomiting:   No    Edema:     TrLE    Vaginal bleeding:   no    Vaginal discharge:   no    Fundal height:      33    FHR:       154    Fetal activity:     yes    Labor symptoms:   few ctx    Fetal position:     ??    Cx Dilation:     0    Cx Effacement:   0%    Cx Station:     high    Taking prenatal vits?   Y    Smoking:     n/a    Next visit:     2 wk    Resident:     Earnest Bailey    Preceptor:     [place]

## 2010-03-12 NOTE — Assessment & Plan Note (Signed)
Summary: OB/KH   Vital Signs:  Patient profile:   25 year old female Weight:      344 pounds Temp:     97.7 degrees F oral Pulse rate:   97 / minute Pulse rhythm:   regular BP sitting:   148 / 86  (left arm) Cuff size:   large  Vitals Entered By: Loralee Pacas CMA (August 14, 2009 9:15 AM)  Serial Vital Signs/Assessments:  Time      Position  BP       Pulse  Resp  Temp     By                     118/80                         Rodney Langton MD   Primary Care Provider:  Rodney Langton MD   History of Present Illness: Here for ROB visit.  N/V resolved with zofran and hydration.  BP high but normal on recheck.  Habits & Providers  Alcohol-Tobacco-Diet     Cigarette Packs/Day: n/a  Current Medications (verified): 1)  Promethazine Hcl 25 Mg Tabs (Promethazine Hcl) .... One Three Times A Day As Needed For Nausea Associated With Migranes. 2)  Calcium 600/vitamin D 600-400 Mg-Unit Chew (Calcium Carbonate-Vitamin D) .... One Tablet By Mouth Two Times A Day 3)  Prenatal Vitamins 0.8 Mg Tabs (Prenatal Multivit-Min-Fe-Fa) .... One Tab By Mouth Daily 4)  Cephalexin 250 Mg Tabs (Cephalexin) .... One Tab By Mouth Daily Until End of Pregnancy 5)  Auralgan  Soln (Antipyrine-Benzocaine-Polycos) .... 2 Drops in Painful Ear Qid. 6)  Acetaminophen 650 Mg Cr-Tabs (Acetaminophen) .... One Tab By Mouth Three Times A Day 7)  Ondansetron Hcl 4 Mg Tabs (Ondansetron Hcl) .... One Tab By Mouth Q4hrs As Needed For Nausea, Vomiting  Allergies (verified): No Known Drug Allergies  Review of Systems       See HPI  Physical Exam  General:  Well-developed,well-nourished,in no acute distress; alert,appropriate and cooperative throughout examination Abdomen:  Obese, gravid, appropriate for EGA, non tender.   Impression & Recommendations:  Problem # 1:  PREGNANCY WITH OTHER POOR REPRODUCTIVE HISTORY (ICD-V23.5) Assessment Unchanged 25yo G5P1031 at 37.0 weeks Initiated prenatal care at  10 weeks. Refused genetic screening. Best EDC: 09/04/2009 by 9wk [place] Hb: 13.5/11  Plt:243 HIV: neg/Neg RPR: NR/NR Hep B: neg Rubella: Immune Blood type: O pos  Ab: neg Sickle Screen: neg Dating [place]: Consistent with LMP. GC/Chlam/PAP: neg/neg/neg 1h Glucola: 98/108 Taking PNV. Anatomy scan female, normal. Referred to [facility] for 24wk stillborn, SABx2->needs [place] at 20, 28, 34 weeks.  Fetal ECHO after anatomy scan. Fetal ECHO Normal, 28 and 34 wk [place] normal (>90% growth). GBS/GC/Chlam: neg/neg/neg Desires ParaGard IUD after delivery. Desires RLTCS: Set up for July 18 @ 0900. RTC 1 week  BP initially high but normal on manual recheck. Few ctx but random and not regular nor very painful, checking UA.  Orders: Urinalysis-[facility] (00000) [facility]- Est  Level 4 (62130)  Problem # 2:  GASTROENTERITIS, VIRAL (ICD-008.8) Assessment: Improved Resolved.  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  Her updated medication list for this problem includes:    Cephalexin 250 Mg Tabs (Cephalexin) ..... One tab by mouth daily until end of pregnancy  Orders: Urinalysis-[facility] (00000) [facility]- Est  Level 4 (86578) Urine Culture-[facility] ([id number])  Complete Medication List: 1)  Promethazine Hcl 25 Mg Tabs (Promethazine hcl) .... One three times a day as needed for nausea associated with migranes. 2)  Calcium 600/vitamin D 600-400 Mg-unit Chew (Calcium carbonate-vitamin d) .... One tablet by mouth two times a day 3)  Prenatal Vitamins 0.8 Mg Tabs (Prenatal multivit-min-fe-fa) .... One tab by mouth daily 4)  Cephalexin 250 Mg Tabs (Cephalexin) .... One tab by mouth daily until end of pregnancy 5)  Auralgan Soln (Antipyrine-benzocaine-polycos) .... 2 drops in painful ear qid. 6)  Acetaminophen 650 Mg Cr-tabs (Acetaminophen) .... One tab by mouth three times a day 7)  Ondansetron Hcl 4 Mg Tabs (Ondansetron hcl) ....  One tab by mouth q4hrs as needed for nausea, vomiting  Patient Instructions: 1)  Great to see you as usual! 2)  Come back in 1 week 3)  If you have vaginal bleeding, contractions that are consistent, or any other concerns, please go to the MAU or the Clinic.  Every morning and every evening evaluate whether you think the baby is moving enough.  If not, then do kick counts as follows.  Sit in a quiet place and count each movement.  If you get to 5 baby movements in an hour you can stop.  If you have less than 5 movements, keep counting for another hour.  If you don't get 10 movements in 2 hours, you should go to the MAU or call the clinic. 4)  -Dr. Lolita Cram [place] for Follow-up Visit    Estimated weeks of       gestation:     37 0/7    Weight:     344    Blood pressure:   148 / 86    Urine protein:       negative    Urine glucose:    negative    Urine nitrite:     negative    Hx headache?     No    Nausea/vomiting?   No    Edema?     0    Bleeding?     no    Leakage/discharge?   no    Fetal activity:       yes    Labor symptoms?   few ctx    Fundal height:      38    FHR:       130    Fetal position:      ??    Taking Vitamins?   Y    Smoking PPD:   n/a    Next visit:     1 wk    Resident:     Benjamin Stain    Preceptor:     Hensel   Laboratory Results   Urine Tests  Date/Time Received: August 14, 2009 10:33 AM  Date/Time Reported: August 14, 2009 10:13 AM   Routine Urinalysis   Color: yellow Appearance: Clear Glucose: negative   (Normal Range: Negative) Bilirubin: small-icto negative   (Normal Range: Negative) Ketone: negative   (Normal Range: Negative) Spec. Gravity: 1.025   (Normal Range: 1.003-1.035) Blood: negative   (Normal Range: Negative) pH: 6.0   (Normal Range: 5.0-8.0) Protein: negative   (Normal Range: Negative) Urobilinogen: 1.0   (Normal Range: 0-1) Nitrite: negative   (Normal Range: Negative) Leukocyte Esterace: small   (Normal Range:  Negative)  Urine Microscopic WBC/HPF: 5-10 RBC/HPF: rare Bacteria/HPF: 3+ Epithelial/HPF: 10-20    Comments: ...........test performed by...........Marland KitchenLupita Leash  Loring, CMA    UA suggestive of UTI, continue Keflex and await UCx. Rodney Langton MD  August 14, 2009 10:47 AM

## 2010-03-12 NOTE — Letter (Signed)
Summary[facility]  [facility]   Imported By: Knox Royalty 09/12/2009 10:03:09  _____________________________________________________________________  External Attachment:    Type:   Image     Comment:   External Document

## 2010-03-12 NOTE — Assessment & Plan Note (Signed)
Summary: OB w/UTI,df   Vital Signs:  Patient profile:   25 year old female Height:      64 inches Weight:      321.1 pounds BMI:     55.32 Temp:     97.1 degrees F oral Pulse rate:   87 / minute BP sitting:   128 / 88  (left arm) Cuff size:   large  Vitals Entered By: Gladstone Pih (March 27, 2009 10:53 AM) CC: C/O flank pain and abd preasure Is Patient Diabetic? No Pain Assessment Patient in pain? no        Primary Care Provider:  Rodney Langton MD  CC:  C/O flank pain and abd preasure.  History of Present Illness: 1. UTI?: Pt is a 25 yo G5P1031 at 17.[redacted] weeks gestation today.  She has a hx of multiple UTI since 2007 as well as a kidney stone.  She presents with recurrent flank pain and pressure.  This has been going on for 3 days.  She was recently treated for a Proteus UTI with Keflex and another UTI with Macrobid.   The symptoms that she is having today feels like another UTI.  The pain and pressure is only when she needs to use the bathroom when it feels like her bladder is full.  Endorses frequency.      ROS: she denies dysuria, burning, fevers, vaginal bleeding, vaginal discharge.  Habits & Providers  Alcohol-Tobacco-Diet     Tobacco Status: never     Cigarette Packs/Day: n/a  Current Medications (verified): 1)  Promethazine Hcl 25 Mg Tabs (Promethazine Hcl) .... One Three Times A Day As Needed For Nausea Associated With Migranes. 2)  Calcium 600/vitamin D 600-400 Mg-Unit Chew (Calcium Carbonate-Vitamin D) .... One Tablet By Mouth Two Times A Day 3)  Prenatal Vitamins 0.8 Mg Tabs (Prenatal Multivit-Min-Fe-Fa) .... One Tab By Mouth Daily  Allergies: No Known Drug Allergies  Past History:  Past Medical History: Reviewed history from 02/03/2009 and no changes required. [id number] history of alopecia areata  IUD inserted 11/15/03 Paraguard  primary nocturnal enuresis  Social History: Reviewed history from 08/16/2007 and no changes required. no  tob/etoh/drugs; Has a son Tyrell 9yo who lives with her mother; pt lives mom and son, worksat [employer],  Alcohol use-no Drug use-no Never Smoked Regular exercise-yes 5x/wk  Physical Exam  General:  vitals reviewed, obese, no acute distress Lungs:  normal respiratory effort.   Heart:  normal rate and regular rhythm.   Abdomen:  obese, soft, non-tender, normal bowel sounds, and no distention.  gravid c/w dates Extremities:  No clubbing, cyanosis, edema, or deformity noted Skin:  Intact without suspicious lesions or rashes Psych:  not depressed appearing.   Additional Exam:  FHT 150s Fundal height:  ~20cm   Impression & Recommendations:  Problem # 1:  FLANK PAIN (ICD-789.09) Assessment New  Bilateral flank pain.  Unsure of cause.  No definite signs of UTI or kidney stones.  Will send urine for culture just to be sure. Possibly musculoskeletal.  Tylenol as needed for pain.  Return to clinic in 1 week.  Orders: [facility]- Est Level  3 (45409)  Complete Medication List: 1)  Promethazine Hcl 25 Mg Tabs (Promethazine hcl) .... One three times a day as needed for nausea associated with migranes. 2)  Calcium 600/vitamin D 600-400 Mg-unit Chew (Calcium carbonate-vitamin d) .... One tablet by mouth two times a day 3)  Prenatal Vitamins 0.8 Mg Tabs (Prenatal multivit-min-fe-fa) .... One tab by mouth  daily  Other Orders: Urinalysis-[facility] (00000) Urine Culture-[facility] ([id number])  Patient Instructions: 1)  I am not sure what is causing you to have all of this pain 2)  The urine sample today shows no signs of a urinary tract infection. 3)  We will send it for a culture just to make sure. 4)  There was also no blood in your urine to make [place] think about a kidney stone 5)  It may be musculoskeletal so you can take some Tylenol as needed for that. 6)  I would also increase the amount of fluids that you are drinking. 7)  Schedule a follow up appointment in 1 week if you are still having  symptoms. 8)  If you start to develop fevers, worsening pain, nausea/vomiting then you should return to clinic or go to the ED.    Flowsheet View for Follow-up Visit    Estimated weeks of       gestation:     17 6/7    Weight:     321.1    Blood pressure:   128 / 88    Urine Protein:     negative    Urine Glucose:   negative    Urine Nitrite:     negative    Headache:     No    Nausea/vomiting:   No    Edema:     0    Vaginal bleeding:   no    Vaginal discharge:   no    Fundal height:      22    FHR:       145    Fetal activity:     no    Labor symptoms:   no    Taking prenatal vits?   Y    Smoking:     n/a    Next visit:     2 wk    Resident:     Lelon Perla    Preceptor:     [facility]  Laboratory Results   Urine Tests  Date/Time Received: March 27, 2009 11:01 AM  Date/Time Reported: March 27, 2009 11:12 AM   Routine Urinalysis   Color: yellow Appearance: Clear Glucose: negative   (Normal Range: Negative) Bilirubin: negative   (Normal Range: Negative) Ketone: negative   (Normal Range: Negative) Spec. Gravity: 1.015   (Normal Range: 1.003-1.035) Blood: negative   (Normal Range: Negative) pH: 6.5   (Normal Range: 5.0-8.0) Protein: negative   (Normal Range: Negative) Urobilinogen: 0.2   (Normal Range: 0-1) Nitrite: negative   (Normal Range: Negative) Leukocyte Esterace: negative   (Normal Range: Negative)    Comments: ...........test performed by..........Marland Kitchen [place], SMA

## 2010-03-12 NOTE — Assessment & Plan Note (Signed)
Summary: depo,tcb  Nurse Visit patient received depo after birth of baby on 08/27/2009. this is verified thru documentation from  [facility]. states she plans on having  BTL. Theresia Lo RN  November 13, 2009 2:19 PM   Allergies: No Known Drug Allergies  Medication Administration  Injection # 1:    Medication: Depo-Provera 150mg     Diagnosis: CONTRACEPTIVE MANAGEMENT (ICD-V25.09)    Route: IM    Site: L deltoid    Exp Date: 04/2012    Lot #: [id number]    Mfr: [email or web address]    Comments: next Depo due Dec 22 thru Feb 12, 2010    Patient tolerated injection without complications    Given by: Theresia Lo RN (November 13, 2009 2:16 PM)  Orders Added: 1)  Depo-Provera 150mg  [J1055] 2)  Admin of Injection (IM/SQ) [78295]   Medication Administration  Injection # 1:    Medication: Depo-Provera 150mg     Diagnosis: CONTRACEPTIVE MANAGEMENT (ICD-V25.09)    Route: IM    Site: L deltoid    Exp Date: 04/2012    Lot #: [id number]    Mfr: [email or web address]    Comments: next Depo due Dec 22 thru Feb 12, 2010    Patient tolerated injection without complications    Given by: Theresia Lo RN (November 13, 2009 2:16 PM)  Orders Added: 1)  Depo-Provera 150mg  [J1055] 2)  Admin of Injection (IM/SQ) [65784]  Appended Document: depo,tcb    Clinical Lists Changes  Medications: Added new medication of DEPO-PROVERA 150 MG/ML SUSP (MEDROXYPROGESTERONE ACETATE) Injected IM q3 months.

## 2010-03-12 NOTE — Progress Notes (Signed)
Summary: triage  Phone Note Call from Patient Call back at Home Phone [phone]   Caller: Patient Summary of Call: Bp today is 157/86.   Initial call taken by: Clydell Hakim,  Jul 01, 2009 10:28 AM  Follow-up for Phone Call        LM asking her to call back. really should see md Follow-up by: Golden Circle RN,  Jul 01, 2009 10:36 AM  Additional Follow-up for Phone Call Additional follow up Details #1::        she refused to come in.  states she drives 2 hrs to work. gets little sleep & just cannot get here in am before work. has appt friday. states her pressure varies. denies symptoms. to pcp Additional Follow-up by: Golden Circle RN,  Jul 01, 2009 10:50 AM    Additional Follow-up for Phone Call Additional follow up Details #2::    Ok, come as soon as she can. Follow-up by: Rodney Langton MD,  Jul 01, 2009 2:11 PM

## 2010-03-12 NOTE — Assessment & Plan Note (Signed)
Summary: F/U/KH   Vital Signs:  Patient profile:   25 year old female Height:      64 inches Weight:      324 pounds BMI:     55.82 BSA:     2.40 Temp:     98.5 degrees F Pulse rate:   85 / minute BP sitting:   122 / 82  Vitals Entered By: Jone Baseman CMA (October 27, 2009 9:06 AM) CC: Paraguard insertion Is Patient Diabetic? No Pain Assessment Patient in pain? yes     Location: lower back Intensity: 3   Primary Care Provider:  Rodney Langton MD  CC:  Paraguard insertion.  History of Present Illness: 24 yo female approx 2 months PP, here for IUD placement.  Upreg neg.  Went to [facility] 5d ago for UTI.  Dx pyelonephritis and put on keflex, this was recently changed to cipro x 7d.  Still with back pain.  No fevers/chills, no N/V/D/C.  Habits & Providers  Alcohol-Tobacco-Diet     Tobacco Status: never     Cigarette Packs/Day: n/a  Current Medications (verified): 1)  Promethazine Hcl 25 Mg Tabs (Promethazine Hcl) .... One Three Times A Day As Needed For Nausea Associated With Migranes. 2)  Calcium 600/vitamin D 600-400 Mg-Unit Chew (Calcium Carbonate-Vitamin D) .... One Tablet By Mouth Two Times A Day 3)  Prenatal Vitamins 0.8 Mg Tabs (Prenatal Multivit-Min-Fe-Fa) .... One Tab By Mouth Daily 4)  Ferrous Sulfate 325 (65 Fe) Mg Tbec (Ferrous Sulfate) .... One Tab By Mouth Two Times A Day 5)  Reglan 10 Mg Tabs (Metoclopramide Hcl) .... One Tab By Mouth Three Times A Day 6)  Cipro 500 Mg Tabs (Ciprofloxacin Hcl) .... One Tab By Mouth Two Times A Day X 7d in Addition To Previous 7d Course (14d Total)  Allergies (verified): No Known Drug Allergies  Review of Systems       See HPI  Physical Exam  General:  Well-developed,well-nourished,in no acute distress; alert,appropriate and cooperative throughout examination Lungs:  Normal respiratory effort, chest expands symmetrically. Lungs are clear to auscultation, no crackles or wheezes. Heart:  Normal rate and regular  rhythm. S1 and S2 normal without gallop, murmur, click, rub or other extra sounds. Abdomen:  Bowel sounds positive,abdomen soft and non-tender without masses, organomegaly or hernias noted. Genitalia:  Normal introitus for age, no external lesions, no vaginal discharge, mucosa pink and moist, no vaginal or cervical lesions, no vaginal atrophy, no friaility or hemorrhage, normal uterus size and position, no adnexal masses or tenderness Additional Exam:  IUD placement (ParaGard) Consent Obtained Timeout conducted. After speculum placement steril gloves used. Cervix and vault cleaned with betadine. Tenaculum placed in ant cervix. Uterus sounded to 8cm.; Paragard placed however did slide back out of cervix. No longer sterile so procedure ended. Pt wishes to have BTL rather than come back for another Paragard placement.    Impression & Recommendations:  Problem # 1:  CONTRACEPTIVE MANAGEMENT (ICD-V25.09) Assessment Unchanged Unable to place ParaGard. Referral to [facility] for BTL scheduling per pt request.  Orders: U Preg-[facility] (40981) Gynecologic Referral (Gyn)  Problem # 2:  FREQUENCY, URINARY (ICD-788.41) Assessment: Unchanged Will cont cipro but add another 7d for 14d total course for pyelonephritis. fu UCx.  Orders: Urinalysis-[facility] (00000) Urine Culture-[facility] ([id number])  Complete Medication List: 1)  Promethazine Hcl 25 Mg Tabs (Promethazine hcl) .... One three times a day as needed for nausea associated with migranes. 2)  Calcium 600/vitamin D 600-400 Mg-unit Chew (Calcium carbonate-vitamin d) .Marland KitchenMarland KitchenMarland Kitchen  One tablet by mouth two times a day 3)  Prenatal Vitamins 0.8 Mg Tabs (Prenatal multivit-min-fe-fa) .... One tab by mouth daily 4)  Ferrous Sulfate 325 (65 Fe) Mg Tbec (Ferrous sulfate) .... One tab by mouth two times a day 5)  Reglan 10 Mg Tabs (Metoclopramide hcl) .... One tab by mouth three times a day 6)  Cipro 500 Mg Tabs (Ciprofloxacin hcl) .... One tab by mouth two times a  day x 7d in addition to previous 7d course (14d total)  Patient Instructions: 1)  Great to see you, 2)  I'm sorry we couldn't get the IUD in for you. 3)  I have referred you to [facility] clinic for tubal ligation. 4)  Finish a 14 day total course of antibiotics for your pyelonephritis. 5)  Come back to see me if no better after the antibiotics. 6)  -Dr. Karie Schwalbe. Prescriptions: CIPRO 500 MG TABS (CIPROFLOXACIN HCL) One tab by mouth two times a day x 7d in addition to previous 7d course (14d total)  #14 x 0   Entered and Authorized by:   Rodney Langton MD   Signed by:   Rodney Langton MD on 10/27/2009   Method used:   Electronically to        [facility] [address] [phone]* (retail)       [address]       [place], [place]  [address]       Ph: [phone] or [phone]       Fax: [phone]   RxID:   [id number]   Laboratory Results   Urine Tests  Date/Time Received: October 27, 2009 9:30 AM  Date/Time Reported: October 27, 2009 10:28 AM   Routine Urinalysis   Color: yellow Appearance: Clear Glucose: negative   (Normal Range: Negative) Bilirubin: negative   (Normal Range: Negative) Ketone: negative   (Normal Range: Negative) Spec. Gravity: 1.020   (Normal Range: 1.003-1.035) Blood: moderate   (Normal Range: Negative) pH: 6.0   (Normal Range: 5.0-8.0) Protein: negative   (Normal Range: Negative) Urobilinogen: 0.2   (Normal Range: 0-1) Nitrite: negative   (Normal Range: Negative) Leukocyte Esterace: small   (Normal Range: Negative)  Urine Microscopic WBC/HPF: 5-15 with few clumps RBC/HPF: occ Bacteria/HPF: 1+ Epithelial/HPF: 1-5    Urine HCG: negative Comments: urine sent for culture ...............test performed by......Marland KitchenBonnie A. [place], MLS (ASCP)cm       Appended Document: Orders Update    Clinical Lists Changes  Orders: [phone]) - Signed      Appended Document:  F/U/KH

## 2010-03-12 NOTE — Assessment & Plan Note (Signed)
Summary: 16wk ob,df   Vital Signs:  Patient profile:   25 year old female Weight:      322.1 pounds Temp:     98.4 degrees F oral Pulse rate:   109 / minute Pulse rhythm:   regular BP sitting:   134 / 83  (left arm) Cuff size:   large  Vitals Entered By: Loralee Pacas CMA (March 14, 2009 2:33 PM) Comments pt is currently taking antibiotics for bladder inf.  went to ED on saturday for this   Primary Care Provider:  Rodney Langton MD   History of Present Illness: 25yo here for ROB visit.  See flowsheet and A/P.  Recent UTI, on macrobid.  Habits & Providers  Alcohol-Tobacco-Diet     Cigarette Packs/Day: n/a  Allergies: No Known Drug Allergies  Physical Exam  General:  Well-developed,well-nourished,in no acute distress; alert,appropriate and cooperative throughout examination Abdomen:  Morbidly obese, accurate measurement of FH impossible 2/2 pannus.   Impression & Recommendations:  Problem # 1:  SUPERVISION OF NORMAL FIRST PREGNANCY (ICD-V22.0) Assessment Unchanged 25yo G5P1031 at 16.0 weeks Initiated prenatal care at 10 weeks. Refused genetic screening. EDC: 08/29/2009 Hb: 13.5  Plt:243 HIV: neg RPR: NR Hep B: neg Rubella: Immune Blood type: O pos  Ab: neg Sickle Screen: neg Dating [place]: Consistent with LMP. GC/Chlam/PAP neg/neg/neg 1h Glucola: 98  Referred to [facility] for 24wk stillborn, SABx2->needs [place] at 20, 28, 34 weeks.  Fetal ECHO after 20wk anatomy scan. Desires RLTCS, needs to be set up near term.  RTC 4 weeks  Problem # 2:  DYSURIA (ICD-788.1) Assessment: Improved Finished Keflex for previous proteus UTI, recently dx with another UTI, went to ED and got macrobid, almost finished, asymptomatic now.  UA neg, TOC UCx today.  Orders: Urinalysis-[facility] (00000) Urine Culture-[facility] ([id number])  Complete Medication List: 1)  Promethazine Hcl 25 Mg Tabs (Promethazine hcl) .... One three times a day as needed for nausea associated with migranes. 2)   Calcium 600/vitamin D 600-400 Mg-unit Chew (Calcium carbonate-vitamin d) .... One tablet by mouth two times a day 3)  Prenatal Vitamins 0.8 Mg Tabs (Prenatal multivit-min-fe-fa) .... One tab by mouth daily 4)  Keflex 500 Mg Caps (Cephalexin) .... One tab by mouth bid x 5 days. 5)  Metronidazole 500 Mg Tabs (Metronidazole) .... Four tabs by mouth x1  Other Orders: Medicaid OB visit - [facility] ([id number])  Patient Instructions: 1)  Great to see you today, 2)  We US'ed your baby today. 3)  Come back to see me in 4 weeks. 4)  Call if you have any vaginal bleeding. 5)  Be sure to take your prenatal vitamins daily. 6)  -Dr. Karie Schwalbe.    Laboratory Results   Urine Tests  Date/Time Received: March 14, 2009 2:43 PM  Date/Time Reported: March 14, 2009 2:46 PM   Routine Urinalysis   Color: yellow Appearance: Clear Glucose: negative   (Normal Range: Negative) Bilirubin: negative   (Normal Range: Negative) Ketone: negative   (Normal Range: Negative) Spec. Gravity: 1.020   (Normal Range: 1.003-1.035) Blood: negative   (Normal Range: Negative) pH: 7.5   (Normal Range: 5.0-8.0) Protein: negative   (Normal Range: Negative) Urobilinogen: 0.2   (Normal Range: 0-1) Nitrite: negative   (Normal Range: Negative) Leukocyte Esterace: negative   (Normal Range: Negative)    Comments: ...........test performed by...........Marland KitchenTerese Door, CMA      Flowsheet View for Follow-up Visit    Estimated weeks of       gestation:  16 0/7    Weight:     322.1    Blood pressure:   134 / 83    Urine protein:       negative    Urine glucose:    negative    Urine nitrite:     negative    Hx headache?     No    Nausea/vomiting?   No    Edema?     0    Bleeding?     no    Leakage/discharge?   no    Fetal activity:       no    Labor symptoms?   no    Fundal height:      20    FHR:       150    Fetal position:      transv    Taking Vitamins?   Y    Smoking PPD:   n/a    Comment:     Doing well, FHR not  heard with doppler so bedside US used, fetal heart visualized, dopplered, and normal.  See A/P    Next visit:     4 wk    Resident:     Benjamin Stain

## 2010-03-12 NOTE — Assessment & Plan Note (Signed)
Summary: OB/KH   Vital Signs:  Patient profile:   25 year old female Height:      64 inches Weight:      345.1 pounds BMI:     59.45 Temp:     98.1 degrees F oral Pulse rate:   84 / minute BP sitting:   117 / 75  (left arm) Cuff size:   large  Vitals Entered By: Gladstone Pih (July 31, 2009 8:31 AM) CC: OB  35 0/7 Is Patient Diabetic? No Pain Assessment Patient in pain? no        CC:  OB  35 0/7.  Habits & Providers  Alcohol-Tobacco-Diet     Tobacco Status: never     Cigarette Packs/Day: n/a  Allergies: No Known Drug Allergies  Physical Exam  Heart:  Normal rate and regular rhythm. S1 and S2 normal without gallop, murmur, click, rub or other extra sounds. Neurologic:  No cranial nerve deficits noted. Station and gait are normal.  Sensory, motor and coordinative functions appear intact.   Impression & Recommendations:  Problem # 1:  PREGNANCY WITH OTHER POOR REPRODUCTIVE HISTORY (ICD-V23.5) Assessment Unchanged 25yo G5P1031 at 33.1 weeks Initiated prenatal care at 10 weeks. Refused genetic screening. Best EDC: 09/04/2009 by 9wk [place] Hb: 13.5/11  Plt:243 HIV: neg/Neg RPR: NR/NR Hep B: neg Rubella: Immune Blood type: O pos  Ab: neg Sickle Screen: neg Dating [place]: Consistent with LMP. GC/Chlam/PAP: neg/neg/neg 1h Glucola: 98/108 Taking PNV. Anatomy scan female, normal. Fetal ECHO Normal 28wk extra [place] normal  Referred to [facility] for 24wk stillborn, SABx2->needs [place] at 20, 28, 34 weeks.  Fetal ECHO after anatomy scan.  Desires RLTCS, has been seen by OB to set up dates  reviewed ultrasound today at 34 weeks, which showed > 90% growth Scheduled visit with [facility] today for repeat C/S consultation and to set date.  GBS/GC/Chlam today  RTC 1 week Orders: GC/Chlamydia-[facility] (87591/87491) Grp B Probe-[facility] ([id number]) Other OB visit- [facility] ([facility]K)  Problem # 2:  ORTHOSTATIC DIZZINESS (ICD-780.4) Assessment: Unchanged went to ED in [place] for eval.  no problems  found.  normal EKG.  gave packet of clinic info for her to take with her on trips for work. Her updated medication list for this problem includes:    Promethazine Hcl 25 Mg Tabs (Promethazine hcl) ..... One three times a day as needed for nausea associated with migranes.  Complete Medication List: 1)  Promethazine Hcl 25 Mg Tabs (Promethazine hcl) .... One three times a day as needed for nausea associated with migranes. 2)  Calcium 600/vitamin D 600-400 Mg-unit Chew (Calcium carbonate-vitamin d) .... One tablet by mouth two times a day 3)  Prenatal Vitamins 0.8 Mg Tabs (Prenatal multivit-min-fe-fa) .... One tab by mouth daily 4)  Cephalexin 250 Mg Tabs (Cephalexin) .... One tab by mouth daily until end of pregnancy 5)  Auralgan Soln (Antipyrine-benzocaine-polycos) .... 2 drops in painful ear qid. 6)  Acetaminophen 650 Mg Cr-tabs (Acetaminophen) .... One tab by mouth three times a day  Patient Instructions: 1)  it was nice to meet you today 2)  please come back in 1 week 3)  If you have vaginal bleeding, contractions that are consistent, or any other concerns, please go to the MAU or the Clinic.  Every morning and every evening evaluate whether you think the baby is moving enough.  If not, then do kick counts as follows.  Sit in a quiet place and count each movement.  If you get to 5 baby  movements in an hour you can stop.  If you have less than 5 movements, keep counting for another hour.  If you don't get 10 movements in 2 hours, you should go to the MAU or call the clinic  Prenatal Visit Concerns noted: had an episode of dizziness and fainted while in [place] for work.  concerned about driving 4 hours each way for work, but wants to keep working   [employer] for Follow-up Visit    Estimated weeks of       gestation:     35 0/7    Weight:     345.1    Blood pressure:   117 / 75    Headache:     No    Nausea/vomiting:   No    Edema:     TrLE    Vaginal bleeding:   no    Vaginal  discharge:   no    Fundal height:      38    FHR:       150    Fetal activity:     yes    Labor symptoms:   no    Taking prenatal vits?   Y    Smoking:     n/a    Next visit:     1 wk    Resident:     RPS    Preceptor:     Jprdan    Appended Document: OB/KH I saw and examined Ms. Ufot with Dr. Hulen Luster. She has been experiencing some dizziness and near syncope, but her neuro and cardiac exams are normal.  Encouraged to try to stay hydrated.  Offered note for work, but pt wants to continue working.    She has had serial ultrasounds for her prior pregnancy loss; these have been normal. Fetal echo normal (done for h/o prior child with cardiac defect) Plans repeat section.  Has met with OB service and they are scheduling.   Follow up with Dr. Benjamin Stain.

## 2010-03-12 NOTE — Miscellaneous (Signed)
Summary: [facility] Consultation  Clinical Lists Changes I saw your patient today in [facility]: UFOT, Kathleen Andrews. 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 I reviewed her prenatal labs with her. Her early 1hr GCT was normal. At this time we recommend: 1) For h/o stillbirth at 24 weeks: Repeat her 1hr GCT at 26-28 weeks, Ultrasounds at 11, 28 and 34 weeks. 2) For son with h/o congenital heart disease: Fetal echo after 20 week anatomy scan 3) Previous C/S: Patient desires repeat C/S, declines TOLAC. 4) History of recurrent SAB (x3): She agreed to a work up if she ends up losing this pregnancy, which will likely include thrombophilia work up, chromosomal analysis, etc. 5) Trich on pap: She said she was treated with Flagyl 3 times and did not tolerate an entire course. I checked a wet prep today which was negative. So send her back our way if anything turns out abnormal. Otherwise, she will resume prenatal care with you. Thanks for the referral - Jettie Pagan

## 2010-03-12 NOTE — Assessment & Plan Note (Signed)
Summary: OB F/U [facility]   Vital Signs:  Patient profile:   25 year old female Weight:      323 pounds BP sitting:   134 / 82  Primary Care Provider:  Rodney Langton MD   History of Present Illness: 25yo here for ROB visit.  See flowsheet and A/P.  TOC for UTI neg.  Episode of nausea, orthostasis.  Resolved immediately with sitting down.  Habits & Providers  Alcohol-Tobacco-Diet     Cigarette Packs/Day: n/a  Current Medications (verified): 1)  Promethazine Hcl 25 Mg Tabs (Promethazine Hcl) .... One Three Times A Day As Needed For Nausea Associated With Migranes. 2)  Calcium 600/vitamin D 600-400 Mg-Unit Chew (Calcium Carbonate-Vitamin D) .... One Tablet By Mouth Two Times A Day 3)  Prenatal Vitamins 0.8 Mg Tabs (Prenatal Multivit-Min-Fe-Fa) .... One Tab By Mouth Daily  Allergies (verified): No Known Drug Allergies  Physical Exam  General:  Well-developed,well-nourished,in no acute distress; alert,appropriate and cooperative throughout examination Abdomen:  gravid c/w dates   Impression & Recommendations:  Problem # 1:  SUPERVISION OF NORMAL FIRST PREGNANCY (ICD-V22.0) 25yo [id number] at 19.5 weeks Initiated prenatal care at 10 weeks. Refused genetic screening. EDC: 08/29/2009 Hb: 13.5  Plt:243 HIV: neg RPR: NR Hep B: neg Rubella: Immune Blood type: O pos  Ab: neg Sickle Screen: neg Dating [place]: Consistent with LMP. GC/Chlam/PAP neg/neg/neg 1h Glucola: 98 Taking PNV. Anatomy scan today.  Referred to [facility] for 24wk stillborn, SABx2->needs [place] at 20, 28, 34 weeks.  Fetal ECHO after 20wk anatomy scan. Desires RLTCS, needs to be set up near term (30 wks).  Going on cruise.  Form filled out ensuring <[redacted]wks EGA.  Orders: Medicaid OB visit - [facility] ([id number]) Prenatal U/S > 14 weeks - 81191 (Prenatal U/S)  Problem # 2:  ORTHOSTATIC DIZZINESS (ICD-780.4) Assessment: New With some nausea.  Likely normal variant with pregnancy, will check CBC, CMET today.  Keep hydrated,  compression stockings if able.  Her updated medication list for this problem includes:    Promethazine Hcl 25 Mg Tabs (Promethazine hcl) ..... One three times a day as needed for nausea associated with migranes.  Orders: Medicaid OB visit - [facility] ([id number]) Comp Met-[facility] [phone]) CBC-[facility] (84696)  Complete Medication List: 1)  Promethazine Hcl 25 Mg Tabs (Promethazine hcl) .... One three times a day as needed for nausea associated with migranes. 2)  Calcium 600/vitamin D 600-400 Mg-unit Chew (Calcium carbonate-vitamin d) .... One tablet by mouth two times a day 3)  Prenatal Vitamins 0.8 Mg Tabs (Prenatal multivit-min-fe-fa) .... One tab by mouth daily  Patient Instructions: 1)  Great to see you today, 2)  Come back to see me in 4 weeks. 3)  Call if you have any vaginal bleeding. 4)  Be sure to take your prenatal vitamins daily. 5)  Anatomy scan ultrasound. 6)  Labs. 7)  Enjoy your cruise! 8)  -Dr. Lolita [facility] [place] for Follow-up Visit    Estimated weeks of       gestation:     19 5/7    Weight:     323    Blood pressure:   134 / 82    Hx headache?     No    Nausea/vomiting?   No    Edema?     TrLE    Bleeding?     no    Leakage/discharge?   no    Fetal activity:       yes    Labor  symptoms?   no    Fundal height:      22    FHR:       150    Fetal position:      ??    Taking Vitamins?   Y    Smoking PPD:   n/a    Comment:     Doing well.  F[facility] heard.  Going on cruise.  Some nausea/orthostasis, BPs ok, checking labs.    Next visit:     4 wk    Resident:     Benjamin Stain    Preceptor:     Chambliss/Jordan  Last PAP:  CELLULAR CHANGES CONSISTENT WITH HYPERKERATOSIS. (02/03/2009 12:00:00 AM) PAP Next Due:  2 yr   Appended Document: OB F/U [facility] Would benefit from chronic suppressive Keflex for recurrent UTIs.  Keflex 250 once daily sent.  Note to white team to call pt re: need to pick up abx.   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) - Signed Medications: Added new medication of CEPHALEXIN 250 MG TABS (CEPHALEXIN) One tab by mouth daily until end of pregnancy - Signed Rx of CEPHALEXIN 250 MG TABS (CEPHALEXIN) One tab by mouth daily until end of pregnancy;  #80 x 0;  Signed;  Entered by: Rodney Langton MD;  Authorized by: Rodney Langton MD;  Method used: Electronically to [facility] [phone]*, [address], [place], [place]  [address], Ph: [phone], Fax: [phone]    Prescriptions: CEPHALEXIN 250 MG TABS (CEPHALEXIN) One tab by mouth daily until end of pregnancy  #80 x 0   Entered and Authorized by:   Rodney Langton MD   Signed by:   Rodney Langton MD on 04/09/2009   Method used:   Electronically to        [facility] [phone]* (retail)       [address]       [place], [place]  [address]       Ph: [phone]       Fax: [phone]   RxID:   [id number]   patient called and notified of Rx.Loralee Pacas CMA  April 09, 2009 11:02 AM

## 2010-03-12 NOTE — Progress Notes (Signed)
Summary: Referral  Phone Note Call from Patient Call back at Home Phone [phone]   Reason for Call: Talk to Nurse Summary of Call: pt is checking status of high risk referral to womens, needs to give notice to work so she can get off Initial call taken by: Knox Royalty,  July 21, 2009 1:41 PM  Follow-up for Phone Call        spoke with pt [facility] clinics closed @ 2pm will call back in the am Follow-up by: Loralee Pacas CMA,  July 21, 2009 4:35 PM  Additional Follow-up for Phone Call Additional follow up Details #1::        ofc visits sent to [facility]. after review they will send back appt time pt notified Additional Follow-up by: Loralee Pacas CMA,  July 22, 2009 10:07 AM     Appended Document: Referral appointment has been scheduled at [facility] clinic for 07/30/2009. patient notified.

## 2010-03-12 NOTE — Letter (Signed)
Summary: Work Letter  [facility]  [address]   [place], [place] [address]   Phone: [phone]  Fax: [phone]    08/06/2009  RONITA HARGREAVES [address] [place], [place]  [address]  To whom it may concern,  Ms. Ufot is a patient of mine and is currently pregnant with a delivery date of July 18, she is medically able to keep working however should not lift more than 10 lbs.   Please call if you have any questions.     Sincerely,   Rodney Langton MD

## 2010-03-12 NOTE — Progress Notes (Signed)
 Summary: Pt refusing [facility] referral, phone notes.  ---- Converted from flag ---- ---- 02/05/2009 9:23 AM, Olam Keeling wrote: Just spoke with this pt about UTI and she asked about where she is to go from here concerning her preg.  looked in chart and saw this referral for an obstetric referral that had not been started.  stated that if we sent her to another office she would not go and  be seen does not like most Dr and is known for walking out of appt.   want to continue with referral,  see pt in office,  call and talk to her first any or all of the above Olam ------------------------------  [facility] for Siyona regarding the need for [facility] referral, please continue with making the referral and call to let her know, also please have her come see me in one month just to follow up with her regarding her [facility] appt. Thanks, Dr. ONEIDA.Spoke with pt about referral and apt with Dr T.  voiced understanding and will call the office back to set up apt with Dr. ONEIDA.SABRASABRAOlam Keeling  February 05, 2009 2:50 PM

## 2010-03-12 NOTE — Progress Notes (Signed)
       Additional Follow-up for Phone Call Additional follow up Details #2::    she is unable to come in today to have her bp checked. will be here at 10:30 tomorow. states she feels fine Follow-up by: Golden Circle RN,  Jun 30, 2009 11:29 AM   Noted, will be here tomorrow. Rodney Langton MD  Jun 30, 2009 1:47 PM

## 2010-03-12 NOTE — Assessment & Plan Note (Signed)
Summary: f/u ER visit/eo   Vital Signs:  Patient profile:   25 year old female Weight:      335.4 pounds Temp:     98.3 degrees F oral Pulse rate:   82 / minute Pulse rhythm:   regular BP sitting:   123 / 80  (right arm) Cuff size:   large  Vitals Entered By: Loralee Pacas CMA (May 19, 2009 8:49 AM)  Primary Care Provider:  Rodney Langton MD   History of Present Illness: 25 year old female pregnant at [redacted] weeks, went to ER 05/14/09 for nausea and vomiting, UA suggestive of UTI, micro suggestive of fungal etiology with many yeast and few bacteria.  UA with only leukocyte esterase.  Here for ER fu.  Symptoms greatly improved, eating, drinking, still a little nauseous.  Currently getting chronic suppressive therapy for UTI.  No cramping, bleeding, feeling baby more well, no fluid leakage.  Current Medications (verified): 1)  Promethazine Hcl 25 Mg Tabs (Promethazine Hcl) .... One Three Times A Day As Needed For Nausea Associated With Migranes. 2)  Calcium 600/vitamin D 600-400 Mg-Unit Chew (Calcium Carbonate-Vitamin D) .... One Tablet By Mouth Two Times A Day 3)  Prenatal Vitamins 0.8 Mg Tabs (Prenatal Multivit-Min-Fe-Fa) .... One Tab By Mouth Daily 4)  Cephalexin 250 Mg Tabs (Cephalexin) .... One Tab By Mouth Daily Until End of Pregnancy 5)  Fluconazole 150 Mg Tabs (Fluconazole) .... One Tab By Mouth X1  Allergies (verified): No Known Drug Allergies  Review of Systems       See HPI  Physical Exam  General:  Well-developed,well-nourished,in no acute distress; alert,appropriate and cooperative throughout examination Abdomen:  Bowel sounds positive,abdomen soft and non-tender without masses, organomegaly or hernias noted. 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  No  Cx done in ED.  UA today with trace LE. Cultured, will treat with diflucan 150mg  by mouth x1 (safe in 3rd trimester).  Will change chronic suppressive therapy if UCx indicates.  Phenergan 25 as needed for nausea.  Her updated medication list for this problem includes:    Cephalexin 250 Mg Tabs (Cephalexin) ..... One tab by mouth daily until end of pregnancy  Orders: Urinalysis-[facility] (00000) Urine Culture-[facility] ([id number]) [facility]- Est Level  3 (09811)  Complete Medication List: 1)  Promethazine Hcl 25 Mg Tabs (Promethazine hcl) .... One three times a day as needed for nausea associated with migranes. 2)  Calcium 600/vitamin D 600-400 Mg-unit Chew (Calcium carbonate-vitamin d) .... One tablet by mouth two times a day 3)  Prenatal Vitamins 0.8 Mg Tabs (Prenatal multivit-min-fe-fa) .... One tab by mouth daily 4)  Cephalexin 250 Mg Tabs (Cephalexin) .... One tab by mouth daily until end of pregnancy 5)  Fluconazole 150 Mg Tabs (Fluconazole) .... One tab by mouth x1  Patient Instructions: 1)  Diflucan 150mg  by mouth x1 dose 2)  Cont Keflex 3)  Keep hydrated 4)  Phenergan for nausea. 5)  Come back to the [facility] for your next appt at the end of April. 6)  -Dr. Karie Schwalbe. Prescriptions: PROMETHAZINE HCL 25 MG TABS (PROMETHAZINE HCL) one three times a day as needed for nausea associated with migranes.  #30 x 1   Entered and Authorized by:   Rodney Langton MD   Signed by:   Rodney Langton MD on 05/19/2009  Method used:   Electronically to        [facility] [phone]* (retail)       [address]       [place], [place]  [address]       Ph: [phone]       Fax: [phone]   RxID:   [id number] FLUCONAZOLE 150 MG TABS (FLUCONAZOLE) One tab by mouth x1  #1 x 0   Entered and Authorized by:   Rodney Langton MD   Signed by:   Rodney Langton MD on 05/19/2009   Method used:   Electronically to        [facility] [phone]* (retail)       [address]        [place], [place]  [address]       Ph: [phone]       Fax: [phone]   RxID:   [id number]   Laboratory Results   Urine Tests  Date/Time Received: May 19, 2009 8:51 AM  Date/Time Reported: May 19, 2009 9:27 AM   Routine Urinalysis   Color: yellow Appearance: Clear Glucose: negative   (Normal Range: Negative) Bilirubin: negative   (Normal Range: Negative) Ketone: negative   (Normal Range: Negative) Spec. Gravity: 1.020   (Normal Range: 1.003-1.035) Blood: negative   (Normal Range: Negative) pH: 7.0   (Normal Range: 5.0-8.0) Protein: negative   (Normal Range: Negative) Urobilinogen: 0.2   (Normal Range: 0-1) Nitrite: negative   (Normal Range: Negative) Leukocyte Esterace: trace   (Normal Range: Negative)  Urine Microscopic WBC/HPF: 1-5 RBC/HPF: rare Bacteria/HPF: 2+ Mucous/HPF: trace Epithelial/HPF: 10-20    Comments: urine sent for culture ...........test performed by...........Marland KitchenTerese Door, CMA'

## 2010-03-12 NOTE — Progress Notes (Signed)
Summary: RX  Phone Note Refill Request Call back at Home Phone [phone]   Refills Requested: Medication #1:  CEPHALEXIN 250 MG TABS One tab by mouth daily until end of pregnancy pt said this was sent on 5/5 but pharmacy never got it, [facility]  Initial call taken by: Knox Royalty,  July 09, 2009 11:20 AM  Follow-up for Phone Call        to PCP Follow-up by: Gladstone Pih,  July 09, 2009 4:08 PM  Additional Follow-up for Phone Call Additional follow up Details #1::        Sent Additional Follow-up by: Rodney Langton MD,  July 09, 2009 4:22 PM    Prescriptions: CEPHALEXIN 250 MG TABS (CEPHALEXIN) One tab by mouth daily until end of pregnancy  #90 x 0   Entered and Authorized by:   Rodney Langton MD   Signed by:   Rodney Langton MD on 07/09/2009   Method used:   Electronically to        [facility] [phone]* (retail)       [address]       [place], [place]  [address]       Ph: [phone]       Fax: [phone]   RxID:   [id number]  pt notified.Loralee Pacas CMA  July 09, 2009 4:25 PM

## 2010-03-12 NOTE — Assessment & Plan Note (Signed)
Summary: OB 26 weeks   Vital Signs:  Patient profile:   25 year old female Weight:      336 pounds BP sitting:   137 / 82  Vitals Entered By: Jone Baseman CMA (May 29, 2009 10:37 AM)  Serial Vital Signs/Assessments:  Time      Position  BP       Pulse  Resp  Temp     By                     127/80                         Kathleen Swaziland MD   Habits & Providers  Alcohol-Tobacco-Diet     Cigarette Packs/Day: n/a  Allergies: No Known Drug Allergies  Physical Exam  General:  Obese.  No distress Lungs:  Normal respiratory effort, chest expands symmetrically. Lungs are clear to auscultation, no crackles or wheezes. Heart:  Normal rate and regular rhythm. S1 and S2 normal without gallop, click, or rub.  1/6 systolic murmur.   Impression & Recommendations:  Problem # 1:  PREGNANCY WITH OTHER POOR REPRODUCTIVE HISTORY (ICD-V23.5)  Progressing well.  Pain is consistent with normal ligament laxity of pregnancy.  Seen by Dr. Katrinka Blazing today for manipulation.   Plans to bring baby to [facility].   PP contraception - plans IUD, but considering BTL H/o section - desires repeat.  To schedule appt with OBs around 30 weeks. Reports she had fetal echo done 4 days ago and told all was well. Schedule u/s in 2 weeks for surveillance given h/o IUFD at 26 weeks with prior pregnancy. Pt declined labs today - get glucola, HIV, RPR in 2 weeks.  Had CBC done in ED on 05/14/09.  No need to repeat. Discussed weight gain in pregnancy.  Orders: Other OB visit- [facility] ([facility])  Problem # 2:  ORTHOSTATIC DIZZINESS (ICD-780.4)  No warnings signs of chest pain, respiratory distress. Syncopal episode more of a near-syncope with hx of remembering entire episode.  Pt to stay hydrated and rest frequently.  If worsens, consider further cardiac w/u.   Her updated medication list for this problem includes:    Promethazine Hcl 25 Mg Tabs (Promethazine hcl) ..... One three times a day as needed for nausea associated with  migranes.  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 Her updated medication list for this problem includes:    Cephalexin 250 Mg Tabs (Cephalexin) ..... One tab by mouth daily until end of pregnancy  Orders: Other OB visit- [facility] ([facility])  Problem # 4:  MIGRAINE, UNSPEC., W/O INTRACTABLE MIGRAINE (ICD-346.90) Pt reports she has migraines in pregnancy as well as prepregnancy.  Does not take anything for them.  Managing ok.  Complete Medication List: 1)  Promethazine Hcl 25 Mg Tabs (Promethazine hcl) .... One three times a day as needed for nausea associated with migranes. 2)  Calcium 600/vitamin D 600-400 Mg-unit Chew (Calcium carbonate-vitamin d) .... One tablet by mouth two times a day 3)  Prenatal Vitamins 0.8 Mg Tabs (Prenatal multivit-min-fe-fa) .... One tab by mouth daily 4)  Cephalexin 250 Mg Tabs (Cephalexin) .... One tab by mouth daily until end of pregnancy 5)  Fluconazole 150 Mg Tabs (Fluconazole) .... One tab by mouth x1  Prenatal Visit Concerns noted: Pt reports ongoing dizziness, feels lightheaded if walks more than 5-10 minutes.  Has  passed out twice, last time 05/14/09. Describes this event as "blanking out".  Remembers event and says her co-worker was talking to her.  Was sitting in chair, no fall.  Seen in ED, was told she was dehydrated with UTI.  Denies chest pain and respiratory symptoms.  Is trying to stay hydrated, does not feel this helps.  Has had leg swelling in past, none currently. Also with sharp pain in pelvis on both sides.  Worse when walking.  Feels better this week than in the past.  Concerned that she had similar symptoms in the past before her 26 week pregnancy loss. Reviewed her h/o of 26 week loss.  Pt says she was so angry that she did not ask any questions at the time, that her water broke and the baby had died.     Flowsheet View for Follow-up Visit    Estimated weeks of        gestation:     26 0/7    Weight:     336    Blood pressure:   137 / 82    Headache:     +    Nausea/vomiting:   No    Edema:     0    Vaginal bleeding:   no    Vaginal discharge:   no    Fundal height:      29    FHR:       150s    Fetal activity:     yes    Labor symptoms:   no    Taking prenatal vits?   Y    Smoking:     n/a    Next visit:     2 wk

## 2010-03-12 NOTE — Assessment & Plan Note (Signed)
Summary: ob visit/eo   Vital Signs:  Patient profile:   25 year old female Weight:      349.5 pounds Temp:     97.8 degrees F oral Pulse rate:   80 / minute Pulse rhythm:   regular BP sitting:   135 / 84  (left arm) Cuff size:   large  Vitals Entered By: Loralee Pacas CMA (August 05, 2009 1:55 PM)  Primary Care Provider:  Rodney Langton MD   History of Present Illness: Here for ROB.  Habits & Providers  Alcohol-Tobacco-Diet     Cigarette Packs/Day: n/a  Current Medications (verified): 1)  Promethazine Hcl 25 Mg Tabs (Promethazine Hcl) .... One Three Times A Day As Needed For Nausea Associated With Migranes. 2)  Calcium 600/vitamin D 600-400 Mg-Unit Chew (Calcium Carbonate-Vitamin D) .... One Tablet By Mouth Two Times A Day 3)  Prenatal Vitamins 0.8 Mg Tabs (Prenatal Multivit-Min-Fe-Fa) .... One Tab By Mouth Daily 4)  Cephalexin 250 Mg Tabs (Cephalexin) .... One Tab By Mouth Daily Until End of Pregnancy 5)  Auralgan  Soln (Antipyrine-Benzocaine-Polycos) .... 2 Drops in Painful Ear Qid. 6)  Acetaminophen 650 Mg Cr-Tabs (Acetaminophen) .... One Tab By Mouth Three Times A Day  Allergies (verified): No Known Drug Allergies  Review of Systems       See HPI  Physical Exam  General:  Well-developed,well-nourished,in no acute distress; alert,appropriate and cooperative throughout examination Abdomen:  Obese, gravid, appropriate for EGA.   Impression & Recommendations:  Problem # 1:  PREGNANCY WITH OTHER POOR REPRODUCTIVE HISTORY (ICD-V23.5) Assessment Unchanged 25yo G5P1031 at 35.5 weeks Initiated prenatal care at 10 weeks. Refused genetic screening. Best EDC: 09/04/2009 by 9wk [place] Hb: 13.5/11  Plt:243 HIV: neg/Neg RPR: NR/NR Hep B: neg Rubella: Immune Blood type: O pos  Ab: neg Sickle Screen: neg Dating [place]: Consistent with LMP. GC/Chlam/PAP: neg/neg/neg 1h Glucola: 98/108 Taking PNV. Anatomy scan female, normal. Referred to [facility] for 24wk stillborn,  SABx2->needs [place] at 20, 28, 34 weeks.  Fetal ECHO after anatomy scan. Fetal ECHO Normal, 28 and 34 wk [place] normal (>90% growth). GBS/GC/Chlam: neg/neg/neg Desires ParaGard IUD after delivery.  Desires RLTCS: Set up for July 18 @ 0900.  RTC 1 week  Orders: Other OB visit- [facility] ([facility])  Problem # 2:  URINARY TRACT INFECTION, RECURRENT (ICD-599.0) Assessment: Unchanged Cont keflex once daily.  Her updated medication list for this problem includes:    Cephalexin 250 Mg Tabs (Cephalexin) ..... One tab by mouth daily until end of pregnancy  Orders: Other OB visit- [facility] ([facility])  Problem # 3:  IUD (ICD-V25.1) Assessment: Unchanged Pt desires ParaGard IUD after pregnancy.   Complete Medication List: 1)  Promethazine Hcl 25 Mg Tabs (Promethazine hcl) .... One three times a day as needed for nausea associated with migranes. 2)  Calcium 600/vitamin D 600-400 Mg-unit Chew (Calcium carbonate-vitamin d) .... One tablet by mouth two times a day 3)  Prenatal Vitamins 0.8 Mg Tabs (Prenatal multivit-min-fe-fa) .... One tab by mouth daily 4)  Cephalexin 250 Mg Tabs (Cephalexin) .... One tab by mouth daily until end of pregnancy 5)  Auralgan Soln (Antipyrine-benzocaine-polycos) .... 2 drops in painful ear qid. 6)  Acetaminophen 650 Mg Cr-tabs (Acetaminophen) .... One tab by mouth three times a day  Patient Instructions: 1)  Great to see you as usual! 2)  Come back in 1 week 3)  If you have vaginal bleeding, contractions that are consistent, or any other concerns, please go to the MAU or the Clinic.  Every morning and every evening evaluate whether you think the baby is moving enough.  If not, then do kick counts as follows.  Sit in a quiet place and count each movement.  If you get to 5 baby movements in an hour you can stop.  If you have less than 5 movements, keep counting for another hour.  If you don't get 10 movements in 2 hours, you should go to the MAU or call the clinic. 4)  -Dr.  Lolita Cram [place] for Follow-up Visit    Estimated weeks of       gestation:     35 5/7    Weight:     349.5    Blood pressure:   135 / 84    Nausea/vomiting?   No    Edema?     0    Bleeding?     no    Leakage/discharge?   no    Fetal activity:       yes    Labor symptoms?   few ctx    Fundal height:      37    FHR:       140    Fetal position:      ??    Taking Vitamins?   Y    Smoking PPD:   n/a    Next visit:     1 wk    Resident:     Benjamin Stain    Preceptor:     Burchette

## 2010-03-12 NOTE — Progress Notes (Signed)
Summary: needs note  Phone Note Call from Patient Call back at Home Phone [phone]   Caller: Patient Summary of Call: needs a note stating that she she is able to stay at work. Please call when ready  Initial call taken by: De Nurse,  August 06, 2009 9:30 AM  Follow-up for Phone Call        Will do.   Follow-up by: Rodney Langton MD,  August 06, 2009 10:08 AM  Additional Follow-up for Phone Call Additional follow up Details #1::        pt called and informed of letter up front for her to pick up Additional Follow-up by: Loralee Pacas CMA,  August 06, 2009 10:21 AM

## 2010-03-12 NOTE — Assessment & Plan Note (Signed)
Summary: PP check up/ls   Vital Signs:  Patient profile:   25 year old female Height:      64 inches Weight:      326 pounds BMI:     56.16 Temp:     98.7 degrees F oral Pulse rate:   86 / minute BP sitting:   126 / 88  (left arm) Cuff size:   large  Vitals Entered By: Jimmy Footman, CMA (October 06, 2009 11:11 AM) CC: PP check up Is Patient Diabetic? No   Primary Care Provider:  Rodney Langton MD  CC:  PP check up.  History of Present Illness: 25 yo female PP, LTCS.  Doing well, no complaints other than very little breast milk production, tries to feed multiple times a day as well as pump, no change in breast milk.  GEts approx 1/4 cupful a day.  No breast pain, no drainage.  Eating, drinking, stooling, voiding well.  Mood excellent.  Interested in Paragard IUD but would like to wait a little longer for her C/S to heal before insertion.  Current Medications (verified): 1)  Promethazine Hcl 25 Mg Tabs (Promethazine Hcl) .... One Three Times A Day As Needed For Nausea Associated With Migranes. 2)  Calcium 600/vitamin D 600-400 Mg-Unit Chew (Calcium Carbonate-Vitamin D) .... One Tablet By Mouth Two Times A Day 3)  Prenatal Vitamins 0.8 Mg Tabs (Prenatal Multivit-Min-Fe-Fa) .... One Tab By Mouth Daily 4)  Ferrous Sulfate 325 (65 Fe) Mg Tbec (Ferrous Sulfate) .... One Tab By Mouth Two Times A Day 5)  Reglan 10 Mg Tabs (Metoclopramide Hcl) .... One Tab By Mouth Three Times A Day  Allergies (verified): No Known Drug Allergies  Review of Systems       See HPI  Physical Exam  General:  Well-developed,well-nourished,in no acute distress; alert,appropriate and cooperative throughout examination Abdomen:  Bowel sounds positive,abdomen soft and non-tender without masses, organomegaly or hernias noted.  C/S site healing well, area when there was some incision opening has resolved.   Impression & Recommendations:  Problem # 1:  POSTPARTUM EXAMINATION, NORMAL  (ICD-V24.2) Assessment New Normal PP exam.  inadequate breast milk production.   Will try metoclopramide 10mg  q8h to increase breast milk.  Rx 1 month, pt can stop if no improvement in that time.   Advised to stop if developed stiff muscles or fever.  PT to RTC for paragard placement when she is ready.  [id number]  Complete Medication List: 1)  Promethazine Hcl 25 Mg Tabs (Promethazine hcl) .... One three times a day as needed for nausea associated with migranes. 2)  Calcium 600/vitamin D 600-400 Mg-unit Chew (Calcium carbonate-vitamin d) .... One tablet by mouth two times a day 3)  Prenatal Vitamins 0.8 Mg Tabs (Prenatal multivit-min-fe-fa) .... One tab by mouth daily 4)  Ferrous Sulfate 325 (65 Fe) Mg Tbec (Ferrous sulfate) .... One tab by mouth two times a day 5)  Reglan 10 Mg Tabs (Metoclopramide hcl) .... One tab by mouth three times a day  Patient Instructions: 1)  Great to see you, 2)  Make an appt for ParaGard insertion. 3)  Reglan for your inadequate breast milk production.  Take this three times a day for a month then come back to see me to make sure its working.  If not we'll stop the medication. 4)  -Dr. Karie Schwalbe. Prescriptions: REGLAN 10 MG TABS (METOCLOPRAMIDE HCL) One tab by mouth three times a day  #90 x 3   Entered  and Authorized by:   Rodney Langton MD   Signed by:   Rodney Langton MD on 10/06/2009   Method used:   Electronically to        [facility] [address] [id number]* (retail)       [address]       [place], [place]  [address]       Ph: [phone] or [phone]       Fax: [phone]   RxID:   [id number]

## 2010-03-12 NOTE — Assessment & Plan Note (Signed)
Summary: f/u eo   Vital Signs:  Patient profile:   25 year old female Height:      64 inches Weight:      330.3 pounds BMI:     56.90 Temp:     98.1 degrees F oral Pulse rate:   93 / minute BP sitting:   128 / 81  (left arm) Cuff size:   large  Vitals Entered By: Gladstone Pih (May 08, 2009 9:28 AM) CC: OB  23  6/7 Is Patient Diabetic? No Pain Assessment Patient in pain? no      Comments C/O of abd preasure when active LMP (date): 11/22/2008 [facility] 09/04/2009   Primary Care Provider:  Rodney Langton MD  CC:  OB  23  6/7.  History of Present Illness: 25yo here for ROB visit.  See flowsheet and A/P.  Habits & Providers  Alcohol-Tobacco-Diet     Tobacco Status: never     Cigarette Packs/Day: n/a  Current Medications (verified): 1)  Promethazine Hcl 25 Mg Tabs (Promethazine Hcl) .... One Three Times A Day As Needed For Nausea Associated With Migranes. 2)  Calcium 600/vitamin D 600-400 Mg-Unit Chew (Calcium Carbonate-Vitamin D) .... One Tablet By Mouth Two Times A Day 3)  Prenatal Vitamins 0.8 Mg Tabs (Prenatal Multivit-Min-Fe-Fa) .... One Tab By Mouth Daily 4)  Cephalexin 250 Mg Tabs (Cephalexin) .... One Tab By Mouth Daily Until End of Pregnancy  Allergies (verified): No Known Drug Allergies  Review of Systems       See HPI  Physical Exam  General:  Well-developed,well-nourished,in no acute distress; alert,appropriate and cooperative throughout examination Abdomen:  gravid c/w dates Extremities:  No clubbing, cyanosis, edema, or deformity noted with normal full range of motion of all joints.     Impression & Recommendations:  Problem # 1:  SUPERVISION OF NORMAL FIRST PREGNANCY (ICD-V22.0) Assessment Unchanged 25yo G5P1031 at 23.0 weeks Initiated prenatal care at 10 weeks. Refused genetic screening. Best EDC: 09/04/2009 by 9wk [place] Hb: 13.5  Plt:243 HIV: neg RPR: NR Hep B: neg Rubella: Immune Blood type: O pos  Ab: neg Sickle Screen: neg Dating  [place]: Consistent with LMP. GC/Chlam/PAP: neg/neg/neg 1h Glucola: 98 Taking PNV. Anatomy scan female, normal.  Referred to [facility] for 24wk stillborn, SABx2->needs [place] at 20, 28, 34 weeks.  Fetal ECHO after 20wk anatomy scan. Desires RLTCS, needs to be set up near term (30 wks).  Fetal ECHO ordered. RTC 4 weeks.  Orders: Prenatal other (Prenatal other) Medicaid OB visit - [facility] ([id number])  Complete Medication List: 1)  Promethazine Hcl 25 Mg Tabs (Promethazine hcl) .... One three times a day as needed for nausea associated with migranes. 2)  Calcium 600/vitamin D 600-400 Mg-unit Chew (Calcium carbonate-vitamin d) .... One tablet by mouth two times a day 3)  Prenatal Vitamins 0.8 Mg Tabs (Prenatal multivit-min-fe-fa) .... One tab by mouth daily 4)  Cephalexin 250 Mg Tabs (Cephalexin) .... One tab by mouth daily until end of pregnancy  Patient Instructions: 1)  Great to see you today, 2)  Come back to see me in 4 weeks. 3)  Call if you have any vaginal bleeding. 4)  Be sure to take your prenatal vitamins daily. 5)  Fetal ECHO needs to be done 6)  -Dr. Karie Schwalbe.  Prenatal Visit [facility] Confirmation:    New working [facility]: 09/04/2009      Flowsheet View for Follow-up Visit    Estimated weeks of       gestation:     76  0/7    Weight:     330.3    Blood pressure:   128 / 81    Hx headache?     No    Nausea/vomiting?   No    Edema?     0    Bleeding?     no    Leakage/discharge?   no    Fetal activity:       yes    Labor symptoms?   no    Fundal height:      28    FHR:       155    Fetal position:      ??    Taking Vitamins?   Y    Smoking PPD:   n/a    Comment:     Doing well.  FHR heard, BPs ok, Fetal ECHO ordered.    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  Menstrual History    LMP (date): 11/22/2008    Best Working EDC: 09/04/2009    LMP - Character: normal    Menarche: 8 years    Menses interval: 35 days    Menstrual flow 3 days    On BCP's at conception: no   Flowsheet View for Follow-up Visit    Estimated weeks of       gestation:     23 0/7    Weight:     330.3    Blood pressure:   128 / 81    Headache:     No    Nausea/vomiting:   No    Edema:     0    Vaginal bleeding:   no    Vaginal discharge:   no    Fundal height:      28    FHR:       155    Fetal activity:     yes    Labor symptoms:   no    Fetal position:     ??    Taking prenatal vits?   Y    Smoking:     n/a    Next visit:     4 wk    Resident:     Benjamin Stain    Comment:     Doing well.  FHR heard, BPs ok, Fetal ECHO ordered.

## 2010-03-12 NOTE — Progress Notes (Signed)
Summary: triage  Phone Note Call from Patient Call back at Home Phone [phone]   Caller: Patient Summary of Call: bp is 167/78 - blurry vision  Initial call taken by: De Nurse,  Jun 27, 2009 10:44 AM  Follow-up for Phone Call        started 2 days ago. pregnant [redacted] weeks. feels bad. sent to [facility]. she agreed & will go Follow-up by: Golden Circle RN,  Jun 27, 2009 10:46 AM  Additional Follow-up for Phone Call Additional follow up Details #1::        Noted, to [facility]. Additional Follow-up by: Rodney Langton MD,  Jun 27, 2009 2:53 PM     Appended Document: triage BPs fine, DC'ed from MAU.  Resume routine PNC.

## 2010-03-12 NOTE — Progress Notes (Signed)
Summary: triage  Phone Note Call from Patient Call back at Home Phone [phone]   Caller: Patient Summary of Call: been nausea w/ diarrhea for the past 2 days needs to talk to nurse Initial call taken by: De Nurse,  August 12, 2009 1:52 PM  Follow-up for Phone Call        started yesterday am. has not taken anything for it. when the nausea hits, she has more contractions. having a C section on 08/25/09. has general body aches as well. works in [place] so unable to come today. advised tylenol for general aches. get & use the immodium for liquidy stools now. explained the directions. urged her to sip on fluids, not gulp. wait at least 5 minutes between sips to make sure they do not induce vomiting.  warned her about dangers of dehydration. told her to use a prime care office or ED in [place] if she is unable to keep fluids down &  the diarrhea does not abate after immodium. appt made for am but again told her to use ED if she does not improve shortly. she agreed Follow-up by: Golden Circle RN,  August 12, 2009 2:12 PM  Additional Follow-up for Phone Call Additional follow up Details #1::        Noted, also may use phenergan as needed for nausea.  Does she need any refills? Additional Follow-up by: Rodney Langton MD,  August 12, 2009 2:25 PM    Additional Follow-up for Phone Call Additional follow up Details #2::    stated she had phergan but it did not help Follow-up by: Golden Circle RN,  August 12, 2009 3:08 PM  Additional Follow-up for Phone Call Additional follow up Details #3:: Details for Additional Follow-up Action Taken: Noted, will see her tomo. Additional Follow-up by: Rodney Langton MD,  August 12, 2009 4:01 PM

## 2010-03-12 NOTE — Consult Note (Signed)
Summary: [facility]: Normal Fetal ECHO  [facility]   Imported By: Clydell Hakim 05/29/2009 12:26:12  _____________________________________________________________________  External Attachment:    Type:   Image     Comment:   External Document

## 2010-03-12 NOTE — Assessment & Plan Note (Signed)
Summary: OB/KH   Vital Signs:  Patient profile:   25 year old female Weight:      346.8 pounds Temp:     98.3 degrees F oral Pulse rate:   61 / minute Pulse rhythm:   regular BP sitting:   138 / 88  (left arm) Cuff size:   large  Vitals Entered By: Loralee Pacas CMA (August 21, 2009 10:46 AM)  Serial Vital Signs/Assessments:  Time      Position  BP       Pulse  Resp  Temp     By                     120/80                         Rodney Langton MD   Primary Care Provider:  Rodney Langton MD   History of Present Illness: Here for ROB visit.  BP normal on recheck.  Habits & Providers  Alcohol-Tobacco-Diet     Cigarette Packs/Day: n/a  Current Medications (verified): 1)  Promethazine Hcl 25 Mg Tabs (Promethazine Hcl) .... One Three Times A Day As Needed For Nausea Associated With Migranes. 2)  Calcium 600/vitamin D 600-400 Mg-Unit Chew (Calcium Carbonate-Vitamin D) .... One Tablet By Mouth Two Times A Day 3)  Prenatal Vitamins 0.8 Mg Tabs (Prenatal Multivit-Min-Fe-Fa) .... One Tab By Mouth Daily 4)  Cephalexin 250 Mg Tabs (Cephalexin) .... One Tab By Mouth Daily Until End of Pregnancy 5)  Auralgan  Soln (Antipyrine-Benzocaine-Polycos) .... 2 Drops in Painful Ear Qid. 6)  Acetaminophen 650 Mg Cr-Tabs (Acetaminophen) .... One Tab By Mouth Three Times A Day 7)  Ondansetron Hcl 4 Mg Tabs (Ondansetron Hcl) .... One Tab By Mouth Q4hrs As Needed For Nausea, Vomiting  Allergies (verified): No Known Drug Allergies  Physical Exam  General:  Well-developed,well-nourished,in no acute distress; alert,appropriate and cooperative throughout examination Abdomen:  Obese, gravid, appropriate for EGA, non tender.   Impression & Recommendations:  Problem # 1:  PREGNANCY WITH OTHER POOR REPRODUCTIVE HISTORY (ICD-V23.5) Assessment Unchanged 25yo G5P1031 at 38.0 weeks Initiated prenatal care at 10 weeks. Refused genetic screening. Best EDC: 09/04/2009 by 9wk [place] Hb: 13.5/11    Plt:243 HIV: neg/Neg RPR: NR/NR Hep B: neg Rubella: Immune Blood type: O pos  Ab: neg Sickle Screen: neg Dating [place]: Consistent with LMP. GC/Chlam/PAP: neg/neg/neg 1h Glucola: 98/108 Taking PNV. Anatomy scan female, normal. Referred to [facility] for 24wk stillborn, SABx2->needs [place] at 20, 28, 34 weeks.  Fetal ECHO after anatomy scan. Fetal ECHO Normal, 28 and 34 wk [place] normal (>90% growth). GBS/GC/Chlam: neg/neg/neg Desires ParaGard IUD after delivery. Desires RLTCS: Set up for July 18 @ 0900.  BP initially high but normal on manual recheck.  Will meet pt at [facility] for RLTCS on Monday 08/25/09.  No follow up scheduled.  Orders: Medicaid OB visit.  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  Her updated medication list for this problem includes:    Cephalexin 250 Mg Tabs (Cephalexin) ..... One tab by mouth daily until end of pregnancy  Orders: Medicaid OB visit - [facility] ([id number])  Complete Medication List: 1)  Promethazine Hcl 25 Mg Tabs (Promethazine hcl) .... One three times a day as needed for nausea associated with migranes. 2)  Calcium 600/vitamin D 600-400 Mg-unit Chew (Calcium carbonate-vitamin d) .... One tablet by mouth two times a day 3)  Prenatal  Vitamins 0.8 Mg Tabs (Prenatal multivit-min-fe-fa) .... One tab by mouth daily 4)  Cephalexin 250 Mg Tabs (Cephalexin) .... One tab by mouth daily until end of pregnancy 5)  Auralgan Soln (Antipyrine-benzocaine-polycos) .... 2 drops in painful ear qid. 6)  Acetaminophen 650 Mg Cr-tabs (Acetaminophen) .... One tab by mouth three times a day 7)  Ondansetron Hcl 4 Mg Tabs (Ondansetron hcl) .... One tab by mouth q4hrs as needed for nausea, vomiting  Patient Instructions: 1)  Great to see you as usual! 2)  See you on the 18th for your cesarian 3)  If you have vaginal bleeding, contractions that are consistent, or any other concerns, please go to the MAU or the Clinic.  Every  morning and every evening evaluate whether you think the baby is moving enough.  If not, then do kick counts as follows.  Sit in a quiet place and count each movement.  If you get to 5 baby movements in an hour you can stop.  If you have less than 5 movements, keep counting for another hour.  If you don't get 10 movements in 2 hours, you should go to the MAU or call the clinic. 4)  -Dr. Lolita Cram [place] for Follow-up Visit    Estimated weeks of       gestation:     38 0/7    Weight:     346.8    Blood pressure:   138 / 88    Hx headache?     No    Nausea/vomiting?   No    Edema?     0    Bleeding?     no    Leakage/discharge?   no    Fetal activity:       yes    Labor symptoms?   few ctx    Fundal height:      38    FHR:       140    Fetal position:      transv    Taking Vitamins?   Y    Smoking PPD:   n/a    Next visit:     Cesarian scheduled for 08/25/09    Resident:     Benjamin Stain    Preceptor:     Leveda Anna

## 2010-03-12 NOTE — Miscellaneous (Signed)
Summary: Procedure Consent  Procedure Consent   Imported By: Clydell Hakim 10/30/2009 15:06:00  _____________________________________________________________________  External Attachment:    Type:   Image     Comment:   External Document
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
## 2010-03-12 NOTE — Assessment & Plan Note (Signed)
Summary: ob ck,df   Vital Signs:  Patient profile:   25 year old female Weight:      343.1 pounds BP sitting:   128 / 83  Habits & Providers  Alcohol-Tobacco-Diet     Cigarette Packs/Day: n/a  Current Medications (verified): 1)  Promethazine Hcl 25 Mg Tabs (Promethazine Hcl) .... One Three Times A Day As Needed For Nausea Associated With Migranes. 2)  Calcium 600/vitamin D 600-400 Mg-Unit Chew (Calcium Carbonate-Vitamin D) .... One Tablet By Mouth Two Times A Day 3)  Prenatal Vitamins 0.8 Mg Tabs (Prenatal Multivit-Min-Fe-Fa) .... One Tab By Mouth Daily 4)  Cephalexin 250 Mg Tabs (Cephalexin) .... One Tab By Mouth Daily Until End of Pregnancy 5)  Auralgan  Soln (Antipyrine-Benzocaine-Polycos) .... 2 Drops in Painful Ear Qid. 6)  Acetaminophen 650 Mg Cr-Tabs (Acetaminophen) .... One Tab By Mouth Three Times A Day  Allergies (verified): No Known Drug Allergies  Physical Exam  General:  Well-developed,well-nourished,in no acute distress; alert,appropriate and cooperative throughout examination Lungs:  Normal respiratory effort, chest expands symmetrically. Lungs are clear to auscultation, no crackles or wheezes. Heart:  Normal rate and regular rhythm. S1 and S2 normal without gallop, murmur, click, rub or other extra sounds. Abdomen:  Obese, gravid, appropriate for EGA.   Impression & Recommendations:  Problem # 1:  PREGNANCY WITH OTHER POOR REPRODUCTIVE HISTORY (ICD-V23.5) Assessment Unchanged 25yo G5P1031 at 31.1 weeks Initiated prenatal care at 10 weeks. Refused genetic screening. Best EDC: 09/04/2009 by 9wk [place] Hb: 13.5/11  Plt:243 HIV: neg/Neg RPR: NR/NR Hep B: neg Rubella: Immune Blood type: O pos  Ab: neg Sickle Screen: neg Dating [place]: Consistent with LMP. GC/Chlam/PAP: neg/neg/neg 1h Glucola: 98/108 Taking PNV. Anatomy scan female, normal. Fetal ECHO Normal 28wk extra Korea normal  Referred to [facility] for 24wk stillborn, SABx2->needs [place] at 20, 28, 34 weeks.  Fetal  ECHO after 20wk anatomy scan.  Desires RLTCS, set up at next visit.  Needs another Korea at 34 weeks.  GBS/GC/Chlam at 36 wks  RTC 2 weeks.  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  Her updated medication list for this problem includes:    Cephalexin 250 Mg Tabs (Cephalexin) ..... One tab by mouth daily until end of pregnancy  Problem # 3:  OTITIS EXTERNA, ACUTE, RIGHT (ICD-380.12) Assessment: Improved Resolved.  Her updated medication list for this problem includes:    Auralgan Soln (Antipyrine-benzocaine-polycos) .Marland Kitchen... 2 drops in painful ear qid.  Complete Medication List: 1)  Promethazine Hcl 25 Mg Tabs (Promethazine hcl) .... One three times a day as needed for nausea associated with migranes. 2)  Calcium 600/vitamin D 600-400 Mg-unit Chew (Calcium carbonate-vitamin d) .... One tablet by mouth two times a day 3)  Prenatal Vitamins 0.8 Mg Tabs (Prenatal multivit-min-fe-fa) .... One tab by mouth daily 4)  Cephalexin 250 Mg Tabs (Cephalexin) .... One tab by mouth daily until end of pregnancy 5)  Auralgan Soln (Antipyrine-benzocaine-polycos) .... 2 drops in painful ear qid. 6)  Acetaminophen 650 Mg Cr-tabs (Acetaminophen) .... One tab by mouth three times a day  Other Orders: Other OB visit- [facility])  Patient Instructions: 1)  Great to see you today, 2)  you are 31.1 weeks 3)  Everything looks great 4)  Call if you have any vaginal bleeding for fluid leakage. 5)  Be sure to take your prenatal vitamins daily. 6)  Acetaminophen for pelvic pain. 7)  Come back to see me in 2 weeks. 8)  -  Dr. Karie Schwalbe. 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  Menstrual History    LMP (date): 11/22/2008    LMP - Character: normal    Menarche: 8 years    Menses interval: 35 days    Menstrual flow 3 days    On BCP's at conception: no   Flowsheet View for Follow-up Visit    Estimated weeks of       gestation:     31 1/7    Weight:     343.1    Blood pressure:   128 / 83    Headache:     No    Nausea/vomiting:   No    Edema:     0    Vaginal bleeding:   no    Vaginal discharge:   no    Fundal height:      30    FHR:       140    Fetal activity:     yes    Labor symptoms:   no    Fetal position:     ??    Taking prenatal vits?   Y    Smoking:     n/a    Next visit:     2 wk    Resident:     Benjamin Stain    Preceptor:     Jennette Kettle    Comment:     Doing well, some pelvic pain/pressure, no discharge.  Worse when she stands.

## 2010-03-12 NOTE — Progress Notes (Signed)
Summary: c-section dermabond might have open on one side  Phone Note Call from Patient Call back at Home Phone [phone]   Summary of Call: 25 yo female with recent c-section.  who flet a pull on her incision today when she picked something up.  Had her father look at who states there is a area that is pink at the end of the incisioin that is less than 1/4 inch long, no discharge, bleeding, pt states no real pain only uncomfortable.  Pt has hx of dehiscence with last C-section so is nervous.  Pt denies any fever, chills, nausea, vomiting, diarrhea or constipation at this time, pain control with meds she has at home including percocet.  Pt told to coover it with bandage that is clean, to look at it only daily and if getting bigger, having discharge increase pain or fever then she should be seen at either urgent care or [facility]  Pt agreed and verbalized understanding, felt more comfortable after talking to someone.  Initial call taken by: Antoine Primas DO,  August 29, 2009 7:17 PM

## 2010-03-12 NOTE — Progress Notes (Signed)
Summary: Ref Needed  Phone Note Other Incoming Call back at [phone] ext [phone]   Caller: Marcelle Smiling- Mental Health Consultant [employer] Summary of Call: Needs permission to f/up for maternal depression.  Had the [place] been done on pt?  Initial call taken by: Clydell Hakim,  August 15, 2009 11:16 AM  Follow-up for Phone Call        to PCP Follow-up by: Gladstone Pih,  August 15, 2009 11:51 AM  Additional Follow-up for Phone Call Additional follow up Details #1::        No Inocente Salles done, permission granted to fu. Additional Follow-up by: Rodney Langton MD,  August 15, 2009 12:33 PM    Additional Follow-up for Phone Call Additional follow up Details #2::    left message for [facility], per Dr T for F/U Follow-up by: Gladstone Pih,  August 15, 2009 1:38 PM

## 2010-04-03 ENCOUNTER — Encounter (HOSPITAL_COMMUNITY)
Admission: RE | Admit: 2010-04-03 | Discharge: 2010-04-03 | Disposition: A | Discharge status: Home / Self Care | Payer: Managed Care, Other (non HMO) | Source: Ambulatory Visit | Attending: Sports Medicine | Admitting: Sports Medicine

## 2010-04-03 ENCOUNTER — Encounter (HOSPITAL_COMMUNITY): Payer: Managed Care, Other (non HMO)

## 2010-04-03 DIAGNOSIS — O923 Agalactia: Secondary | ICD-10-CM | POA: Insufficient documentation

## 2010-04-23 LAB — POCT URINALYSIS DIPSTICK
Bilirubin Urine: NEGATIVE
Glucose, UA: NEGATIVE mg/dL
Nitrite: NEGATIVE
Urobilinogen, UA: 1 mg/dL (ref 0.0–1.0)

## 2010-04-23 LAB — URINE CULTURE: Colony Count: >=100000

## 2010-04-23 LAB — POCT PREGNANCY, URINE: Preg Test, Ur: NEGATIVE

## 2010-04-25 LAB — CBC
HCT: 29.2 % — ABNORMAL LOW (ref 36.0–46.0)
MCHC: 33.5 g/dL (ref 30.0–36.0)
Platelets: 168 10*3/uL (ref 150–400)
RDW: 16.4 % — ABNORMAL HIGH (ref 11.5–15.5)

## 2010-04-25 LAB — CCBB MATERNAL DONOR DRAW

## 2010-04-26 LAB — URINALYSIS, ROUTINE W REFLEX MICROSCOPIC
Bilirubin Urine: NEGATIVE
Ketones, ur: NEGATIVE mg/dL
Nitrite: NEGATIVE
Specific Gravity, Urine: >1.03 — ABNORMAL HIGH (ref 1.005–1.030)
Urobilinogen, UA: 0.2 mg/dL (ref 0.0–1.0)

## 2010-04-26 LAB — POCT URINALYSIS DIP (DEVICE)
Hgb urine dipstick: NEGATIVE
Protein, ur: NEGATIVE mg/dL
Specific Gravity, Urine: 1.015 (ref 1.005–1.030)
Urobilinogen, UA: 0.2 mg/dL (ref 0.0–1.0)

## 2010-04-26 LAB — CBC
MCH: 25.3 pg — ABNORMAL LOW (ref 26.0–34.0)
Platelets: 193 10*3/uL (ref 150–400)
RBC: 4.88 MIL/uL (ref 3.87–5.11)

## 2010-04-26 LAB — SURGICAL PCR SCREEN: MRSA, PCR: NEGATIVE

## 2010-04-26 LAB — RPR: RPR Ser Ql: NONREACTIVE

## 2010-04-27 LAB — URINE MICROSCOPIC-ADD ON

## 2010-04-27 LAB — URINALYSIS, ROUTINE W REFLEX MICROSCOPIC
Glucose, UA: NEGATIVE mg/dL
Glucose, UA: NEGATIVE mg/dL
Hgb urine dipstick: NEGATIVE
Ketones, ur: NEGATIVE mg/dL
Protein, ur: NEGATIVE mg/dL
pH: 6 (ref 5.0–8.0)

## 2010-04-28 LAB — GLUCOSE, CAPILLARY: Glucose-Capillary: 108 mg/dL — ABNORMAL HIGH (ref 70–99)

## 2010-04-29 ENCOUNTER — Emergency Department (INDEPENDENT_AMBULATORY_CARE_PROVIDER_SITE_OTHER): Payer: Managed Care, Other (non HMO)

## 2010-04-29 ENCOUNTER — Emergency Department (HOSPITAL_BASED_OUTPATIENT_CLINIC_OR_DEPARTMENT_OTHER)
Admission: EM | Admit: 2010-04-29 | Discharge: 2010-04-29 | Disposition: A | Discharge status: Home / Self Care | Payer: Managed Care, Other (non HMO) | Attending: Emergency Medicine | Admitting: Emergency Medicine

## 2010-04-29 DIAGNOSIS — M25579 Pain in unspecified ankle and joints of unspecified foot: Secondary | ICD-10-CM | POA: Insufficient documentation

## 2010-04-29 DIAGNOSIS — M7989 Other specified soft tissue disorders: Secondary | ICD-10-CM

## 2010-04-29 LAB — CBC
MCV: 83.4 fL (ref 78.0–100.0)
Platelets: 179 10*3/uL (ref 150–400)
RDW: 13.1 % (ref 11.5–15.5)
WBC: 12.8 10*3/uL — ABNORMAL HIGH (ref 4.0–10.5)

## 2010-04-29 LAB — DIFFERENTIAL
Basophils Absolute: 0.1 10*3/uL (ref 0.0–0.1)
Basophils Relative: 1 % (ref 0–1)
Eosinophils Absolute: 0.2 10*3/uL (ref 0.0–0.7)
Lymphs Abs: 1.6 10*3/uL (ref 0.7–4.0)
Neutrophils Relative %: 82 % — ABNORMAL HIGH (ref 43–77)

## 2010-04-29 LAB — BASIC METABOLIC PANEL
BUN: 8 mg/dL (ref 6–23)
Chloride: 108 mEq/L (ref 96–112)
Creatinine, Ser: 0.6 mg/dL (ref 0.4–1.2)

## 2010-04-29 LAB — URINE MICROSCOPIC-ADD ON

## 2010-04-29 LAB — URINALYSIS, ROUTINE W REFLEX MICROSCOPIC
Glucose, UA: NEGATIVE mg/dL
Nitrite: NEGATIVE
Protein, ur: NEGATIVE mg/dL
Urobilinogen, UA: 0.2 mg/dL (ref 0.0–1.0)

## 2010-06-19 ENCOUNTER — Encounter: Payer: Self-pay | Admitting: Sports Medicine

## 2010-06-19 ENCOUNTER — Ambulatory Visit (INDEPENDENT_AMBULATORY_CARE_PROVIDER_SITE_OTHER): Payer: Managed Care, Other (non HMO) | Admitting: Sports Medicine

## 2010-06-19 DIAGNOSIS — N3941 Urge incontinence: Secondary | ICD-10-CM

## 2010-06-19 DIAGNOSIS — Z3009 Encounter for other general counseling and advice on contraception: Secondary | ICD-10-CM | POA: Insufficient documentation

## 2010-06-19 DIAGNOSIS — L603 Nail dystrophy: Secondary | ICD-10-CM | POA: Insufficient documentation

## 2010-06-19 DIAGNOSIS — L608 Other nail disorders: Secondary | ICD-10-CM

## 2010-06-19 LAB — POCT UA - MICROSCOPIC ONLY

## 2010-06-19 LAB — COMPREHENSIVE METABOLIC PANEL
Albumin: 3.9 g/dL (ref 3.5–5.2)
BUN: 8 mg/dL (ref 6–23)
Calcium: 9.4 mg/dL (ref 8.4–10.5)
Chloride: 102 mEq/L (ref 96–112)
Glucose, Bld: 102 mg/dL — ABNORMAL HIGH (ref 70–99)
Potassium: 3.9 mEq/L (ref 3.5–5.3)
Sodium: 138 mEq/L (ref 135–145)
Total Protein: 7.8 g/dL (ref 6.0–8.3)

## 2010-06-19 LAB — POCT URINALYSIS DIPSTICK
Bilirubin, UA: NEGATIVE
Glucose, UA: NEGATIVE
Ketones, UA: NEGATIVE
Spec Grav, UA: 1.02

## 2010-06-19 MED ORDER — NORGESTIM-ETH ESTRAD TRIPHASIC 0.18/0.215/0.25 MG-35 MCG PO TABS: ORAL_TABLET | ORAL | Status: DC

## 2010-06-19 MED ORDER — CEPHALEXIN 500 MG PO CAPS: 500.0000 mg | ORAL_CAPSULE | Freq: Two times a day (BID) | ORAL | Status: AC

## 2010-06-19 MED ORDER — TERBINAFINE HCL 250 MG PO TABS: ORAL_TABLET | ORAL | Status: DC

## 2010-06-19 MED ORDER — SOLIFENACIN SUCCINATE 5 MG PO TABS: 5.0000 mg | ORAL_TABLET | Freq: Every day | ORAL | Status: DC

## 2010-06-19 NOTE — Assessment & Plan Note (Signed)
Reasonable to tx for fungus. If no improvement in 3 months, would need to remove toenail. Checking CMET, rx lamisil.

## 2010-06-19 NOTE — Assessment & Plan Note (Addendum)
Symptoms suggestive of urge incontinence. UA with possible UTI, will culture and tx. Will also write script for vesicare for pt to use if symptoms still present after antibiotic tx.

## 2010-06-19 NOTE — Assessment & Plan Note (Addendum)
Upreg. Tri sprintec, pt desires to have period once q 3 months so can use active pills daily for 12 weeks, then 7 days of placebo, then restart. Pt may RTC when decides on other option such as Paragard or Implanon.

## 2010-06-19 NOTE — Progress Notes (Signed)
  Subjective:    Patient ID: Kathleen Andrews, female    DOB: 06-25-1985, 25 y.o.   MRN: [id number]  HPI Family planning:  Wants birth control, pills for now, considering Paragard and implanon.    Urinary symptoms:  Gets sudden urges for the past few months, no dysuria, no changes in quality of urine, no frequency, no CVAT, no fevers/chills/rashes, no suprapubic pain.  Urges are random, she has to run to the toilet, doesn't always make it.  No back pain/trauma.  R toe:  Thickened, itchy 1st-3rd toes on R foot.  Review of Systems    See HPI Objective:   Physical Exam  Constitutional: She appears well-developed and well-nourished. No distress.  Cardiovascular: Normal rate, regular rhythm and normal heart sounds.  Exam reveals no gallop and no friction rub.   No murmur heard. Pulmonary/Chest: Effort normal. No respiratory distress. She has no wheezes. She has no rales. She exhibits no tenderness.  Abdominal: Soft. Bowel sounds are normal. She exhibits no distension and no mass. There is no tenderness. There is no rebound and no guarding.  Skin: Skin is warm and dry.       R 1st-3rd toes yellow, thickened,           Assessment & Plan:

## 2010-06-19 NOTE — Patient Instructions (Addendum)
Great to see you, Keflex for UTI. Lamisil for toenail fungus, come back if no better after treatment and we can remove the toenail. If urinary symptoms don't improve then start the vesicare. Tri sprintec called into [facility] for birth control. You are due for PAP Dec 2012.  Ihor Austin. Benjamin Stain, M.D.

## 2010-06-19 NOTE — Progress Notes (Signed)
Addended by: [place], Drena Ham on: 06/19/2010 04:22 PM   Modules accepted: Orders

## 2010-06-22 LAB — URINE CULTURE: Colony Count: >=100000

## 2010-06-23 IMAGING — US US OB FOLLOW-UP
1 series · 14 of 28 positions shown · non-contrast
Comparison: none

OBSTETRICAL ULTRASOUND:
 This ultrasound exam was performed in the [HOSPITAL] Ultrasound Department.  The OB US report was generated in the AS system, and faxed to the ordering physician.  This report is also available in [HOSPITAL]?s Access[employer] and in [REDACTED] PACS.

[Series 1: us ob follow up · 0.40mm/px · 14 of 39 slices shown]
[im 2/39]
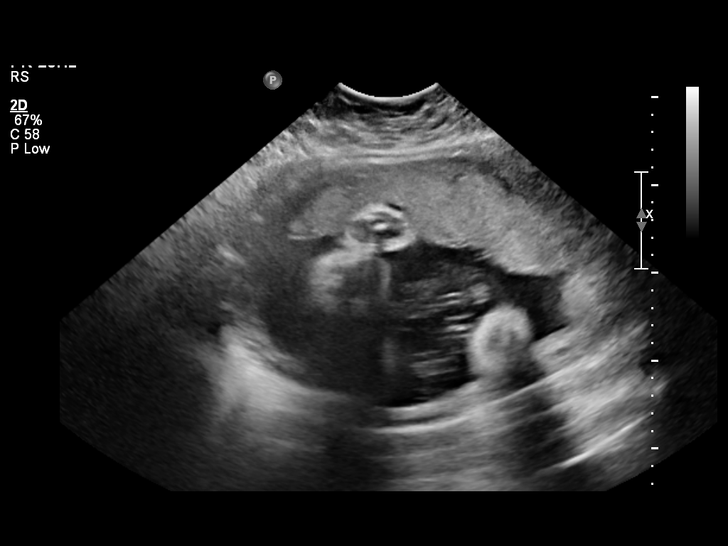
[im 5/39]
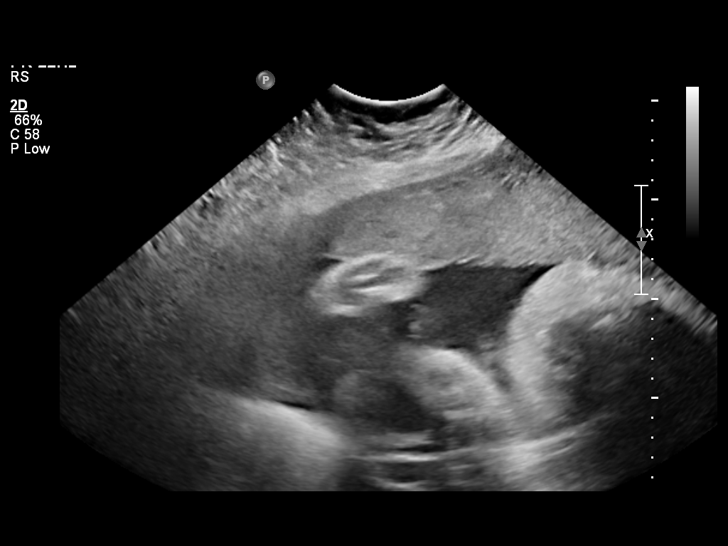
[im 8/39]
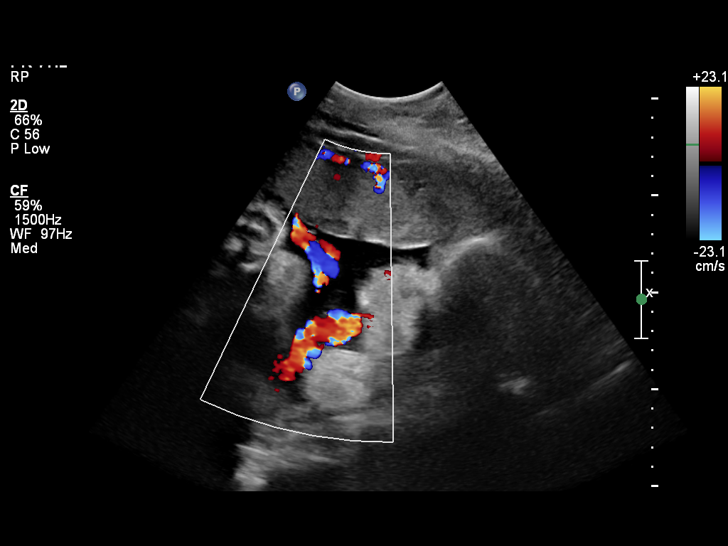
[im 10/39]
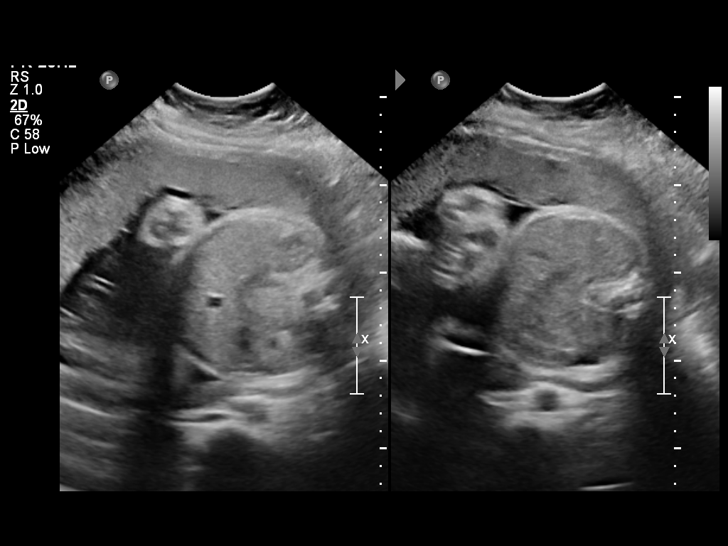
[im 13/39]
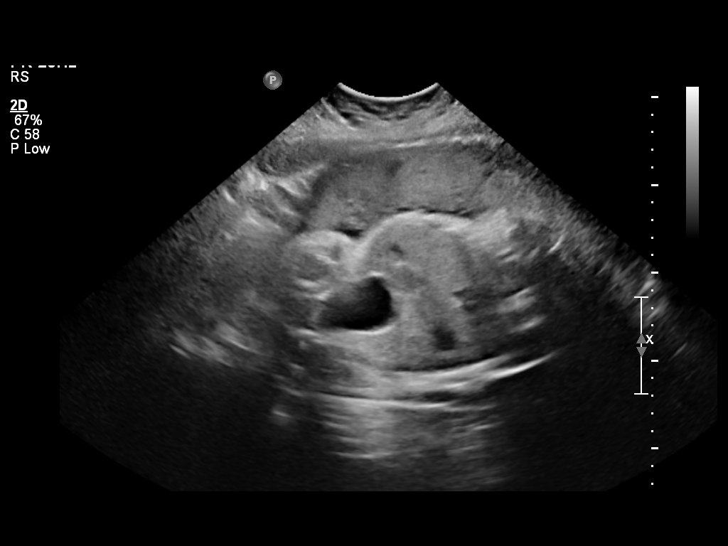
[im 16/39]
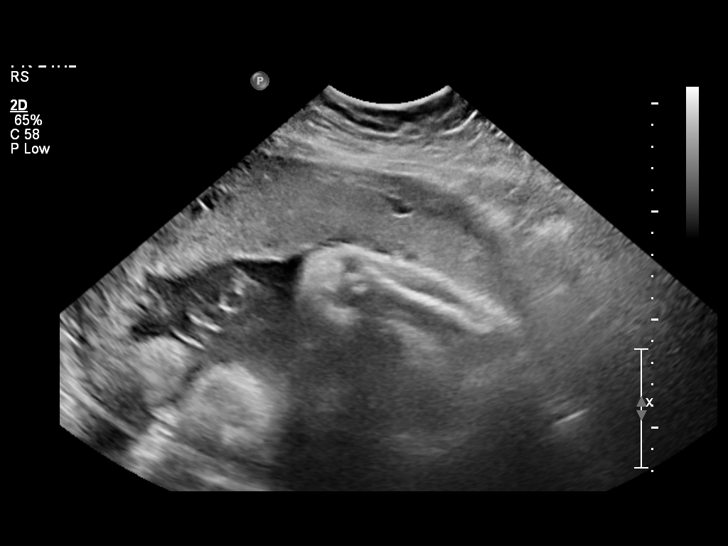
[im 19/39]
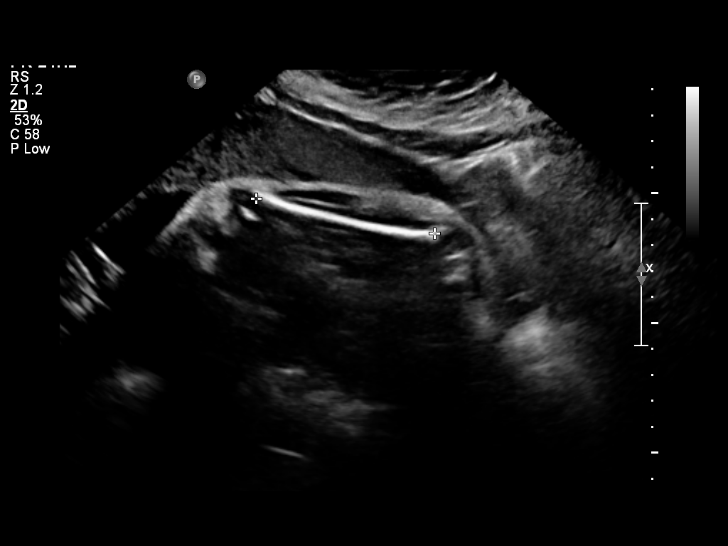
[im 22/39]
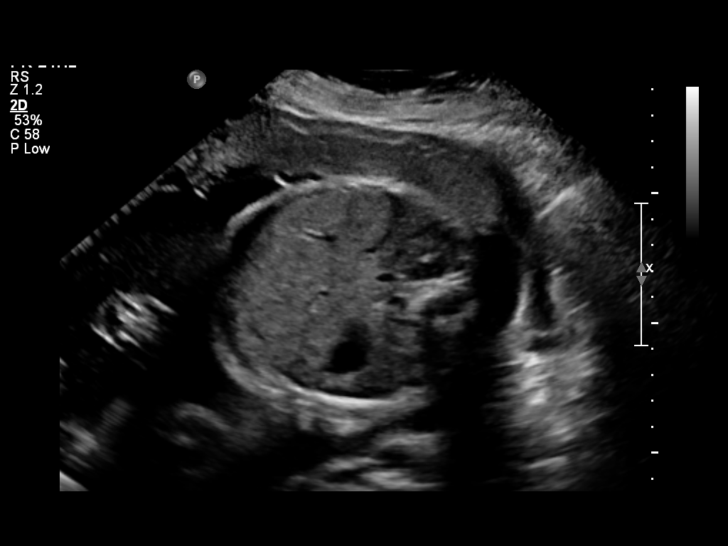
[im 24/39]
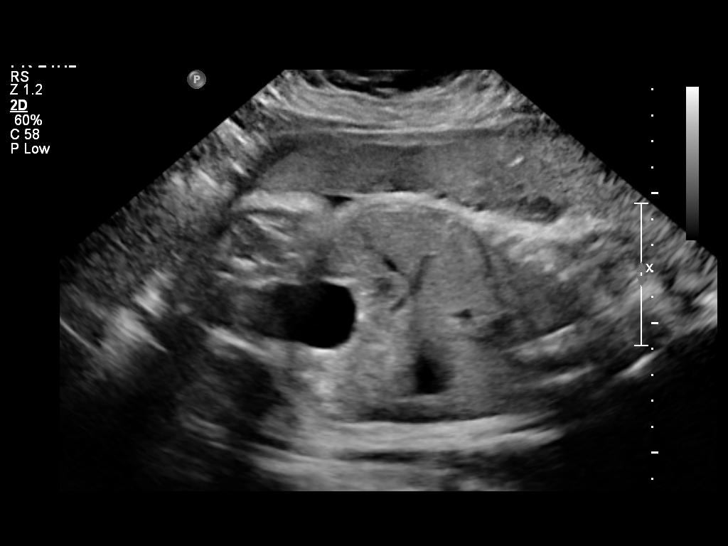
[im 27/39]
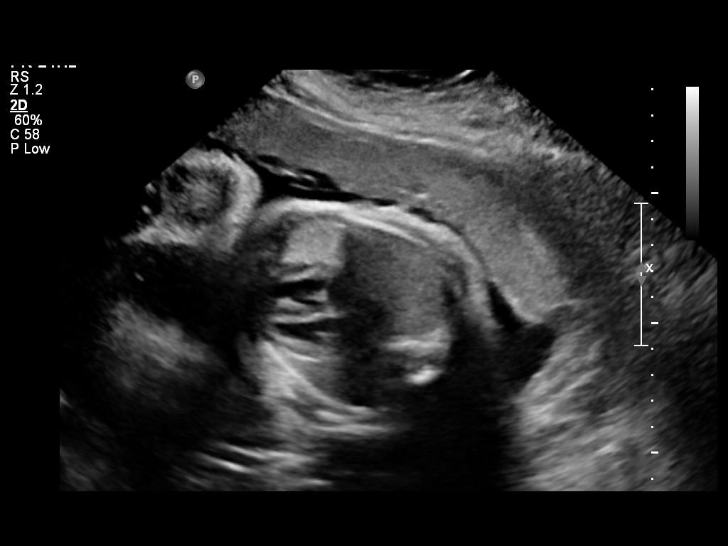
[im 30/39]
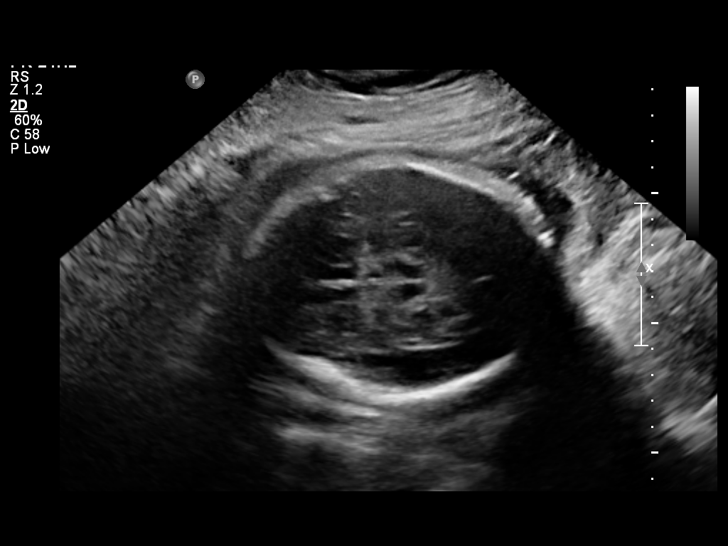
[im 33/39]
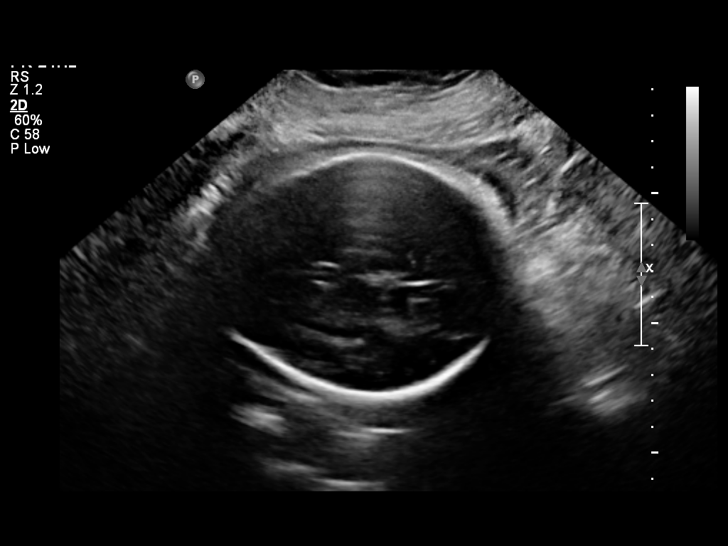
[im 36/39]
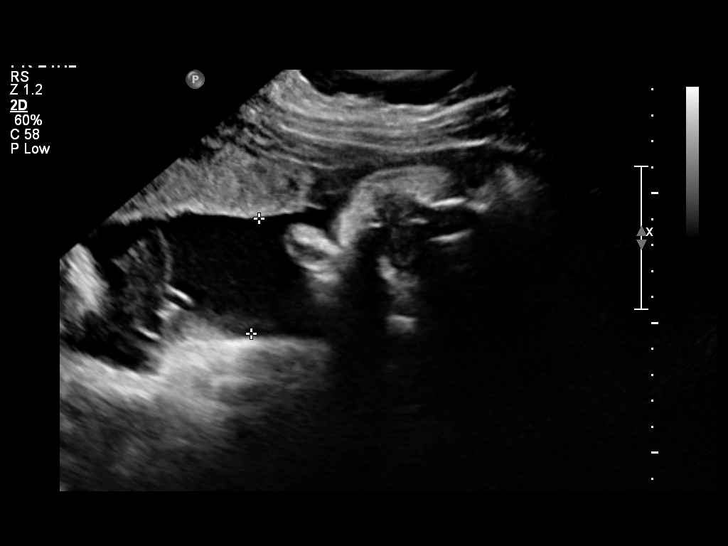
[im 39/39]
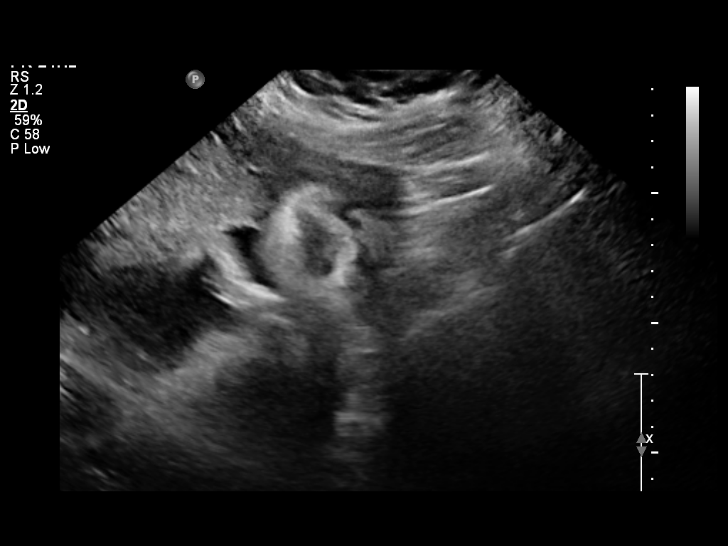

[14 of 28 positions shown; findings below may reference images not displayed]

IMPRESSION: See AS Obstetric US report.

## 2010-09-20 ENCOUNTER — Emergency Department (HOSPITAL_BASED_OUTPATIENT_CLINIC_OR_DEPARTMENT_OTHER)
Admission: EM | Admit: 2010-09-20 | Discharge: 2010-09-21 | Disposition: A | Discharge status: Home / Self Care | Payer: Managed Care, Other (non HMO) | Attending: Emergency Medicine | Admitting: Emergency Medicine

## 2010-09-20 ENCOUNTER — Encounter (HOSPITAL_BASED_OUTPATIENT_CLINIC_OR_DEPARTMENT_OTHER): Payer: Self-pay | Admitting: *Deleted

## 2010-09-20 DIAGNOSIS — R509 Fever, unspecified: Secondary | ICD-10-CM | POA: Insufficient documentation

## 2010-09-20 DIAGNOSIS — N390 Urinary tract infection, site not specified: Secondary | ICD-10-CM | POA: Insufficient documentation

## 2010-09-20 HISTORY — DX: Urinary tract infection, site not specified: A49.9

## 2010-09-20 HISTORY — DX: Bacterial infection, unspecified: N39.0

## 2010-09-20 LAB — PREGNANCY, URINE: Preg Test, Ur: NEGATIVE

## 2010-09-20 LAB — URINALYSIS, ROUTINE W REFLEX MICROSCOPIC
Nitrite: NEGATIVE
Specific Gravity, Urine: 1.017 (ref 1.005–1.030)
Urobilinogen, UA: 1 mg/dL (ref 0.0–1.0)

## 2010-09-20 LAB — URINE MICROSCOPIC-ADD ON

## 2010-09-20 NOTE — ED Notes (Signed)
Pt states she has had lower back pain, fever and chills since yesterday.

## 2010-09-20 NOTE — ED Provider Notes (Signed)
History    Chart scribed for Kathleen Mackie, MD by Enos Fling; the patient was seen in room MH05/MH05; this patient's care was started at 11:21 PM.   CSN: [id number] Arrival date & time: 09/20/2010 10:54 PM  Chief Complaint  Patient presents with  . Fever   HPI Kathleen Andrews is a 25 y.o. female who presents to the Emergency Department complaining of low back pain and f/c. Pt reports sx of deep low back pain and chills onset yesterday with fever noticed today, highest close to 101 after taking tylenol. Back pain is worse with deep breathing. Denies dysuria, urinary frequency or urgency, abd pain, vaginal discharge, or n/v. Pt with h/o UTIs, 8 in the past year, with similar sx; also with h/o kidney stones. LNMP currently, day 3. Pt still eating and drinking normally, trying to drink more fluids that usual. Pt has been seen by urologist in past.   PAST MEDICAL HISTORY:  Past Medical History  Diagnosis Date  . Urinary tract bacterial infections      PAST SURGICAL HISTORY:  Past Surgical History  Procedure Date  . Cesarean section   . Ankle surgery      MEDICATIONS:  Previous Medications   ACETAMINOPHEN (TYLENOL) 650 MG CR TABLET    Take 1,300 mg by mouth every 6 (six) hours as needed. For fever   NORGESTIM-ETH ESTRAD TRIPHASIC (TRI-SPRINTEC PO)    Take 1 tablet by mouth daily.    NORGESTIM-ETH ESTRAD TRIPHASIC 0.18/0.215/0.25 MG-35 MCG TABS    One active tab daily for 12 weeks, then 7 days of placebo, then repeat.   SOLIFENACIN (VESICARE) 5 MG TABLET    Take 1 tablet (5 mg total) by mouth daily.   TERBINAFINE (LAMISIL) 250 MG TABLET    One tab PO daily for 12 weeks.     ALLERGIES:  Allergies as of 09/20/2010 - Review Complete 09/20/2010  Allergen Reaction Noted  . Latex Rash 09/20/2010     FAMILY HISTORY:   History reviewed. No pertinent family history.   SOCIAL HISTORY: History   Social History  . Marital Status: Divorced    Spouse Name: N/A    Number of Children:  N/A  . Years of Education: N/A   Occupational History  . Not on file.   Social History Main Topics  . Smoking status: Never Smoker   . Smokeless tobacco: Not on file  . Alcohol Use: No  . Drug Use: No  . Sexually Active:    Other Topics Concern  . Not on file   Social History Narrative  . No narrative on file       Review of Systems 10 Systems reviewed and are negative for acute change except as noted in the HPI.  Physical Exam  BP 152/96  Pulse 114  Temp(Src) 100.5 F (38.1 C) (Oral)  Resp 20  Ht 5\' 6"  (1.676 m)  Wt 325 lb (147.419 kg)  BMI 52.46 kg/m2  SpO2 100%  LMP 09/20/2010  Physical Exam  Nursing note and vitals reviewed. Constitutional: She is oriented to person, place, and time. She appears well-developed and well-nourished. No distress.  HENT:  Head: Normocephalic.  Mouth/Throat: Mucous membranes are normal.  Eyes:       Normal appearance  Neck: Normal range of motion. Neck supple.  Cardiovascular: Normal rate and regular rhythm.  Exam reveals no gallop and no friction rub.   No murmur heard. Pulmonary/Chest: Effort normal and breath sounds normal. She has no wheezes. She  has no rhonchi. She has no rales.  Abdominal: Soft. Bowel sounds are normal. She exhibits no distension. There is no tenderness. There is no CVA tenderness.  Musculoskeletal: Normal range of motion.       Normal appearance  Neurological: She is alert and oriented to person, place, and time.       Motor intact in all extremities  Skin: Skin is warm and dry. No rash noted.       Color normal  Psychiatric: She has a normal mood and affect.    ED Course  Procedures OTHER DATA REVIEWED: Nursing notes and vital signs reviewed. Prior records reviewed.  LABS / RADIOLOGY: Results for orders placed during the hospital encounter of 09/20/10  URINALYSIS, ROUTINE W REFLEX MICROSCOPIC      Component Value Range   Color, Urine YELLOW  YELLOW    Appearance CLEAR  CLEAR    Specific  Gravity, Urine 1.017  1.005 - 1.030    pH 6.5  5.0 - 8.0    Glucose, UA NEGATIVE  NEGATIVE (mg/dL)   Hgb urine dipstick SMALL (*) NEGATIVE    Bilirubin Urine NEGATIVE  NEGATIVE    Ketones, ur NEGATIVE  NEGATIVE (mg/dL)   Protein, ur NEGATIVE  NEGATIVE (mg/dL)   Urobilinogen, UA 1.0  0.0 - 1.0 (mg/dL)   Nitrite NEGATIVE  NEGATIVE    Leukocytes, UA MODERATE (*) NEGATIVE   PREGNANCY, URINE      Component Value Range   Preg Test, Ur NEGATIVE    URINE MICROSCOPIC-ADD ON      Component Value Range   Squamous Epithelial / LPF FEW (*) RARE    WBC, UA 21-50  <3 (WBC/hpf)   RBC / HPF 3-6  <3 (RBC/hpf)   Bacteria, UA FEW (*) RARE     MDM: 2 days of chills, low back pain c/w prior UTI, has had 9 in the last year.  Urine c/w uti, will treat for same, send for culture  IMPRESSION: UTI   PLAN: discharge The patient is to return the emergency department if there is any worsening of symptoms. I have reviewed the discharge instructions with the patient  CONDITION ON DISCHARGE: stable    SCRIBE ATTESTATION: I personally performed the services described in this documentation, which was scribed in my presence. The recorded information has been reviewed and considered. Kathleen Mackie, MD         Kathleen Mackie, MD 09/21/10 [phone]

## 2010-09-21 MED ORDER — PHENAZOPYRIDINE HCL 100 MG PO TABS: 100.0000 mg | ORAL_TABLET | Freq: Three times a day (TID) | ORAL | Status: AC | PRN

## 2010-09-21 MED ORDER — NAPROXEN SODIUM 220 MG PO TABS: 220.0000 mg | ORAL_TABLET | Freq: Two times a day (BID) | ORAL | Status: DC

## 2010-09-21 MED ORDER — CIPROFLOXACIN HCL 500 MG PO TABS: 500.0000 mg | ORAL_TABLET | Freq: Two times a day (BID) | ORAL | Status: AC

## 2010-09-21 MED ORDER — IBUPROFEN 800 MG PO TABS
800.0000 mg | ORAL_TABLET | Freq: Once | ORAL | Status: AC
  Administered 2010-09-21: 800 mg via ORAL
  Filled 2010-09-21: qty 1

## 2010-09-21 MED ORDER — CIPROFLOXACIN HCL 500 MG PO TABS
500.0000 mg | ORAL_TABLET | Freq: Once | ORAL | Status: AC
  Administered 2010-09-21: 500 mg via ORAL
  Filled 2010-09-21: qty 1

## 2010-09-23 LAB — URINE CULTURE: Colony Count: >=100000

## 2010-09-24 NOTE — ED Notes (Signed)
+   urine Patient treated with cipro-sensitive to same-chart appended per protocol MD. 

## 2010-11-03 ENCOUNTER — Telehealth: Payer: Self-pay | Admitting: Family Medicine

## 2010-11-03 NOTE — Telephone Encounter (Signed)
Needs a note to take to work stating that she has frequent UTI's (last time she went to ED) and that she needs to go to the bathroom more than twice a day.  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  pls advise.

## 2010-11-04 NOTE — Telephone Encounter (Signed)
Will forward to pcp.Kathleen Andrews  

## 2010-11-10 NOTE — Telephone Encounter (Signed)
I have created note.  Pt can pick it up once it is printed out.

## 2010-11-11 NOTE — Telephone Encounter (Signed)
Patient returning Tonya's call.

## 2010-11-11 NOTE — Telephone Encounter (Signed)
Called pt at number listed but it could not find voice mailbox for her. Try again later.Kathleen Andrews

## 2010-11-11 NOTE — Telephone Encounter (Signed)
Informed pt that her note is up front for p/u.Kathleen Andrews

## 2010-12-04 ENCOUNTER — Encounter: Payer: Self-pay | Admitting: Family Medicine

## 2010-12-04 ENCOUNTER — Ambulatory Visit (INDEPENDENT_AMBULATORY_CARE_PROVIDER_SITE_OTHER): Payer: Managed Care, Other (non HMO) | Admitting: Family Medicine

## 2010-12-04 ENCOUNTER — Telehealth: Payer: Self-pay | Admitting: Family Medicine

## 2010-12-04 VITALS — BP 131/85 | HR 103 | Temp 98.3°F | Wt 352.0 lb

## 2010-12-04 DIAGNOSIS — R300 Dysuria: Secondary | ICD-10-CM

## 2010-12-04 DIAGNOSIS — N390 Urinary tract infection, site not specified: Secondary | ICD-10-CM

## 2010-12-04 LAB — POCT URINALYSIS DIPSTICK
Nitrite, UA: NEGATIVE
Protein, UA: NEGATIVE
pH, UA: 7

## 2010-12-04 LAB — POCT UA - MICROSCOPIC ONLY

## 2010-12-04 MED ORDER — CEPHALEXIN 500 MG PO CAPS: 500.0000 mg | ORAL_CAPSULE | Freq: Two times a day (BID) | ORAL | Status: AC

## 2010-12-04 MED ORDER — CIPROFLOXACIN HCL 250 MG PO TABS: 125.0000 mg | ORAL_TABLET | Freq: Every day | ORAL | Status: AC

## 2010-12-04 NOTE — Patient Instructions (Signed)
We will treat your UTI today with keflex. After this you will take daily cipro 125 mg tabs to suppress recurrent UTI's.  I will call you if your tests are not good.  Otherwise I will send you a letter.  If you do not hear from me with in 2 weeks please call our office.    Urinary Tract Infection Infections of the urinary tract can start in several places. A bladder infection (cystitis), a kidney infection (pyelonephritis), and a prostate infection (prostatitis) are different types of urinary tract infections (UTIs). They usually get better if treated with medicines (antibiotics) that kill germs. Take all the medicine until it is gone. You or your child may feel better in a few days, but TAKE ALL MEDICINE or the infection may not respond and may become more difficult to treat. HOME CARE INSTRUCTIONS    Drink enough water and fluids to keep the urine clear or pale yellow. Cranberry juice is especially recommended, in addition to large amounts of water.     Avoid caffeine, tea, and carbonated beverages. They tend to irritate the bladder.     Alcohol may irritate the prostate.     Only take over-the-counter or prescription medicines for pain, discomfort, or fever as directed by your caregiver.  To prevent further infections:  Empty the bladder often. Avoid holding urine for long periods of time.     After a bowel movement, women should cleanse from front to back. Use each tissue only once.     Empty the bladder before and after sexual intercourse.  FINDING OUT THE RESULTS OF YOUR TEST Not all test results are available during your visit. If your or your child's test results are not back during the visit, make an appointment with your caregiver to find out the results. Do not assume everything is normal if you have not heard from your caregiver or the medical facility. It is important for you to follow up on all test results. SEEK MEDICAL CARE IF:    There is back pain.     Your baby is older  than 3 months with a rectal temperature of 100.5 F (38.1 C) or higher for more than 1 day.     Your or your child's problems (symptoms) are no better in 3 days. Return sooner if you or your child is getting worse.  SEEK IMMEDIATE MEDICAL CARE IF:    There is severe back pain or lower abdominal pain.     You or your child develops chills.     You have a fever.     Your baby is older than 3 months with a rectal temperature of 102 F (38.9 C) or higher.     Your baby is 67 months old or younger with a rectal temperature of 100.4 F (38 C) or higher.     There is nausea or vomiting.     There is continued burning or discomfort with urination.  MAKE SURE YOU:    Understand these instructions.     Will watch your condition.     Will get help right away if you are not doing well or get worse.  Document Released: 11/04/2004 Document Revised: 10/07/2010 Document Reviewed: 06/09/2006 [facility] Patient Information 2012 [place], [place].

## 2010-12-04 NOTE — Assessment & Plan Note (Signed)
Will start daily suppression with Cipro 125 mg daily. Patient is worried about birth control on antibiotics. Reviewed literature, safe to use both. She will return for paraguard insertion. Did not culture because sample was contaminated with cocci - pt. Admits sample was not perfectly clean.

## 2010-12-04 NOTE — Telephone Encounter (Signed)
Scheduled appointment today with Dr. Rivka Safer @ 10:45am patient to arrive @ 11am.Kathleen Andrews, [name]

## 2010-12-04 NOTE — Progress Notes (Signed)
  Subjective:    Patient ID: Kathleen Andrews, female    DOB: 1985-06-26, 25 y.o.   MRN: [id number]  HPI 1. UTI  Kathleen Andrews is a 25 y.o. female who complains of urinary frequency, urgency and dysuria x 2 days, with flank pain, n/v, no fever, chills, or abnormal vaginal discharge or bleeding. This is her approximately 10th episode of UTI in the last year. She has been treated with keflex and cipro before, but with recurrence. Cultures have been sensitive showing E.Coli. No hematuria.   Review of Systems See HPI    Objective:   Physical Exam  OBJECTIVE: Appears well, in no apparent distress.  Vital signs are normal. The abdomen is soft without tenderness, guarding, mass, rebound or organomegaly. No CVA tenderness or inguinal adenopathy noted. Urine dipstick shows positive for WBC's, positive for RBC's, positive for nitrates and positive for leukocytes.  Micro exam: 3+ WBC's per HPF and 3+ bacteria.      Assessment & Plan:

## 2010-12-04 NOTE — Telephone Encounter (Signed)
Ms. Kathleen Andrews is calling today to make an appointment for another UTI, she will see Dr. Earnest Bailey on Monday afternoon.  She also would like to speak to a nurse about her symptoms being extremely painful and not being sure what she can do through the weekend.  She has absolutely no interest in going to Urgent Care or the ER and pay the $850 for an antibiotic.

## 2010-12-07 ENCOUNTER — Ambulatory Visit: Payer: Managed Care, Other (non HMO) | Admitting: Family Medicine

## 2011-01-06 ENCOUNTER — Ambulatory Visit: Payer: Managed Care, Other (non HMO) | Admitting: Family Medicine

## 2011-02-26 ENCOUNTER — Encounter: Payer: Managed Care, Other (non HMO) | Admitting: Family Medicine

## 2011-03-08 ENCOUNTER — Other Ambulatory Visit: Payer: Self-pay | Admitting: Sports Medicine

## 2011-03-08 NOTE — Telephone Encounter (Signed)
Refill request

## 2011-03-18 ENCOUNTER — Encounter: Payer: Self-pay | Admitting: Family Medicine

## 2011-03-18 ENCOUNTER — Ambulatory Visit (INDEPENDENT_AMBULATORY_CARE_PROVIDER_SITE_OTHER): Payer: Managed Care, Other (non HMO) | Admitting: Family Medicine

## 2011-03-18 VITALS — BP 135/94 | HR 106 | Temp 98.4°F | Ht 66.0 in | Wt 360.0 lb

## 2011-03-18 DIAGNOSIS — Z3009 Encounter for other general counseling and advice on contraception: Secondary | ICD-10-CM

## 2011-03-18 DIAGNOSIS — Z0000 Encounter for general adult medical examination without abnormal findings: Secondary | ICD-10-CM

## 2011-03-18 DIAGNOSIS — N390 Urinary tract infection, site not specified: Secondary | ICD-10-CM | POA: Insufficient documentation

## 2011-03-18 DIAGNOSIS — R300 Dysuria: Secondary | ICD-10-CM

## 2011-03-18 DIAGNOSIS — E669 Obesity, unspecified: Secondary | ICD-10-CM

## 2011-03-18 LAB — POCT URINALYSIS DIPSTICK
Bilirubin, UA: NEGATIVE
Glucose, UA: NEGATIVE
Nitrite, UA: POSITIVE
Spec Grav, UA: 1.02

## 2011-03-18 LAB — POCT UA - MICROSCOPIC ONLY

## 2011-03-18 MED ORDER — NITROFURANTOIN MONOHYD MACRO 100 MG PO CAPS: 100.0000 mg | ORAL_CAPSULE | Freq: Two times a day (BID) | ORAL | Status: DC

## 2011-03-18 MED ORDER — LEVONORGEST-ETH ESTRAD 91-DAY 0.15-0.03 MG PO TABS: 1.0000 | ORAL_TABLET | Freq: Every day | ORAL | Status: DC

## 2011-03-18 MED ORDER — CEPHALEXIN 500 MG PO CAPS: 500.0000 mg | ORAL_CAPSULE | Freq: Two times a day (BID) | ORAL | Status: AC

## 2011-03-18 MED ORDER — NITROFURANTOIN MONOHYD MACRO 100 MG PO CAPS: 100.0000 mg | ORAL_CAPSULE | Freq: Every day | ORAL | Status: AC

## 2011-03-18 NOTE — Assessment & Plan Note (Addendum)
Daily suppressive therapy with macrobid after tx with keflex.  Discussed fact that this should not interfere with OCPs, although OCPs do have a 30% failure rate.

## 2011-03-18 NOTE — Assessment & Plan Note (Signed)
Switch form sprintec to seasonal.  Rviewed cautions with patient.

## 2011-03-18 NOTE — Progress Notes (Signed)
Subjective: The patient is a 26 y.o. year old female who presents today for annual exam and dysuria.  1) Dysuria: UTI that seems to occur at least monthly.  Has been tx with keflex before.  Has not wanted to go on suppression therapy over fear of in affecting her OCPs.  2) Pt reports some problems at work related to supervisor requesting health records without authorization.  3) pt wants to switch from sprintec to seasonale due to confusion at pharmacy.  Has been on OCPs for many years with no problems.  Pt problem list, meds, allergies, social and past medical history reviewed and updated as appropriate.  ROS notable for continued weight gain but no fevers/chills/cp/doe/sob  Objective:  Filed Vitals:   03/18/11 1352  BP: 135/94  Pulse: 106  Temp: 98.4 F (36.9 C)   Gen: Morbidly obese female, NAD CV: RRR Resp: CTABL Abd: Obese, otherwise no acute findings Ext: No edema, 2+ pulses  Assessment/Plan:  Please also see individual problems in problem list for problem-specific plans.

## 2011-03-18 NOTE — Patient Instructions (Signed)
It was good to see you today! I want you to take Keflex two times per day for 1 week then start nightly macrobid. You will take the macrobid for 6 months and then stop.

## 2011-03-19 NOTE — Assessment & Plan Note (Signed)
Discussed importance of weight loss with the patient.  She is not interested in discussing the topic.  She does plan to loose weight but is not interested in talking about her strategies.

## 2011-03-21 LAB — URINE CULTURE: Colony Count: >=100000

## 2011-07-02 ENCOUNTER — Encounter: Payer: Self-pay | Admitting: Family Medicine

## 2011-07-02 ENCOUNTER — Ambulatory Visit (INDEPENDENT_AMBULATORY_CARE_PROVIDER_SITE_OTHER): Payer: Managed Care, Other (non HMO) | Admitting: Family Medicine

## 2011-07-02 VITALS — BP 124/76 | HR 81 | Wt 360.4 lb

## 2011-07-02 DIAGNOSIS — R599 Enlarged lymph nodes, unspecified: Secondary | ICD-10-CM

## 2011-07-02 DIAGNOSIS — R590 Localized enlarged lymph nodes: Secondary | ICD-10-CM

## 2011-07-02 DIAGNOSIS — G43909 Migraine, unspecified, not intractable, without status migrainosus: Secondary | ICD-10-CM

## 2011-07-02 MED ORDER — ELETRIPTAN HYDROBROMIDE 20 MG PO TABS: 20.0000 mg | ORAL_TABLET | ORAL | Status: DC | PRN

## 2011-07-02 MED ORDER — PROPRANOLOL HCL 40 MG PO TABS: ORAL_TABLET | ORAL | Status: DC

## 2011-07-02 NOTE — Patient Instructions (Signed)
It was good to see you today! Come back to see me in about a month so we can see how your migraines are doing.  I have sent in two medications for you, one as a daily medication, the other for when a headache is coming on. I am not concerned about what you felt in your arm pit.  If it comes back, come in to see [place] again.

## 2011-07-10 ENCOUNTER — Encounter: Payer: Self-pay | Admitting: Family Medicine

## 2011-07-10 DIAGNOSIS — R590 Localized enlarged lymph nodes: Secondary | ICD-10-CM | POA: Insufficient documentation

## 2011-07-10 NOTE — Assessment & Plan Note (Signed)
I feel the patient likely had a small abscess or a folliculitis that caused her findings. Currently the area in question appears to be gradually resolving and is very small. There are no other nodes and no other signs of cancer. I've instructed the patient to continue self examinations and return to [place] if this area begins to grow or if other concerns appear.

## 2011-07-10 NOTE — Assessment & Plan Note (Signed)
Patient reports that in the past Relpax has been more helpful than sumatriptan in. I will send in a prescription for that along with propranolol for prophylaxis. The patient's migraines do not appear to be any different than they have been in the past and there are no red flags on review of her history or symptoms. Patient is to followup in 6-8 weeks.

## 2011-07-10 NOTE — Progress Notes (Signed)
Subjective: The patient is a 26 y.o. year old female who presents today for migraines and lump in right axilla.  1. Migraines: Patient does have a long history of problems with migraines. She reports that she has been off of any medications for her migraines for some time now. In the past she had both prophylactic and abortive therapy. Over about the last year her migraines have been gradually coming back. At this point in time she reports weekly headaches that are bilateral, frontal, and throbbing in character. She does report some photophobia with these. She has been treating them with Tylenol to minimal effect.  2. Lump under right axilla: Patient reports that she had some swelling in the right axillary region about a month and a half ago. This ended up forming into a palpable mass that has been gradually resolving. She is still concerned about it. She does not have mammograms at this point in time. She has not complaining of any breast pain, nipple discharge, skin retraction, fevers, chills, problems breathing, or adenopathy anywhere else.  Patient's past medical, social, and family history were reviewed and updated as appropriate. Smoking status: Patient is a nonsmoker  Objective:  Filed Vitals:   07/02/11 1351  BP: 124/76  Pulse: 81   Gen: Morbidly obese female, no immediate distress Axilla: The patient has a very hard time finding the area of interest today. On palpation of this area, there is a small area of firmness beneath the skin. It is approximately 3 mm in diameter. There is no overlying skin changes. Breast exam on that side demonstrates no dominant masses. CV: Regular rate and rhythm Resp: Clear bilaterally Neuro: Cranial nerves II through XII intact bilaterally, extraocular movements intact, pupils equal round react to light  Assessment/Plan:  Please also see individual problems in problem list for problem-specific plans.

## 2011-07-19 ENCOUNTER — Encounter (HOSPITAL_COMMUNITY): Payer: Self-pay | Admitting: *Deleted

## 2011-07-19 ENCOUNTER — Emergency Department (HOSPITAL_COMMUNITY)
Admission: EM | Admit: 2011-07-19 | Discharge: 2011-07-20 | Disposition: A | Discharge status: Home / Self Care | Payer: Managed Care, Other (non HMO) | Attending: Emergency Medicine | Admitting: Emergency Medicine

## 2011-07-19 DIAGNOSIS — R0600 Dyspnea, unspecified: Secondary | ICD-10-CM

## 2011-07-19 DIAGNOSIS — R0989 Other specified symptoms and signs involving the circulatory and respiratory systems: Secondary | ICD-10-CM | POA: Insufficient documentation

## 2011-07-19 DIAGNOSIS — G43909 Migraine, unspecified, not intractable, without status migrainosus: Secondary | ICD-10-CM | POA: Insufficient documentation

## 2011-07-19 DIAGNOSIS — E669 Obesity, unspecified: Secondary | ICD-10-CM | POA: Insufficient documentation

## 2011-07-19 DIAGNOSIS — R0609 Other forms of dyspnea: Secondary | ICD-10-CM | POA: Insufficient documentation

## 2011-07-19 NOTE — ED Notes (Signed)
Headache for 2 days no nv or diarrhea.  Pt feeling diff breathing for 2 hours.  None now

## 2011-07-20 ENCOUNTER — Emergency Department (HOSPITAL_COMMUNITY): Payer: Managed Care, Other (non HMO)

## 2011-07-20 LAB — DIFFERENTIAL
Basophils Relative: 0 % (ref 0–1)
Eosinophils Absolute: 0.3 10*3/uL (ref 0.0–0.7)
Eosinophils Relative: 2 % (ref 0–5)
Monocytes Absolute: 0.7 10*3/uL (ref 0.1–1.0)
Monocytes Relative: 5 % (ref 3–12)
Neutrophils Relative %: 74 % (ref 43–77)

## 2011-07-20 LAB — CBC
Hemoglobin: 12.7 g/dL (ref 12.0–15.0)
MCH: 26.3 pg (ref 26.0–34.0)
MCHC: 32.5 g/dL (ref 30.0–36.0)
MCV: 81 fL (ref 78.0–100.0)

## 2011-07-20 LAB — BASIC METABOLIC PANEL
BUN: 10 mg/dL (ref 6–23)
Calcium: 9.2 mg/dL (ref 8.4–10.5)
Creatinine, Ser: 0.77 mg/dL (ref 0.50–1.10)
GFR calc Af Amer: >90 mL/min (ref >90)
GFR calc non Af Amer: >90 mL/min (ref >90)
Potassium: 4 mEq/L (ref 3.5–5.1)

## 2011-07-20 MED ORDER — IOHEXOL 350 MG/ML SOLN
100.0000 mL | Freq: Once | INTRAVENOUS | Status: AC | PRN
  Administered 2011-07-20: 100 mL via INTRAVENOUS

## 2011-07-20 MED ORDER — DIPHENHYDRAMINE HCL 50 MG/ML IJ SOLN
25.0000 mg | Freq: Once | INTRAMUSCULAR | Status: AC
  Administered 2011-07-20: 25 mg via INTRAVENOUS
  Filled 2011-07-20: qty 1

## 2011-07-20 MED ORDER — METOCLOPRAMIDE HCL 5 MG/ML IJ SOLN
10.0000 mg | Freq: Once | INTRAMUSCULAR | Status: AC
  Administered 2011-07-20: 10 mg via INTRAVENOUS
  Filled 2011-07-20: qty 2

## 2011-07-20 NOTE — ED Notes (Signed)
Went into patient's room sleeping at present.

## 2011-07-20 NOTE — Discharge Instructions (Signed)
Your workup today has not shown any serious cause for your shortness of breath. Please follow up with your primary care doctor for further management of her migraines. Return to the emergency room for worsening condition or new concerning symptoms.  Dyspnea Shortness of breath (dyspnea) is the feeling of uneasy breathing. Dyspnea should be evaluated promptly. DIAGNOSIS  Many tests may be done to find why you are having shortness of breath. Tests may include:  A chest X-ray.   A lung function test.   Blood tests.   Recordings of the electrical activity of the heart (electrocardiogram).   Exercise testing.   Sound wave images of the heart (a cardiac echocardiogram).   A scan.  A cause for your shortness of breath may not be identified initially. In this case, it is important to have a follow-up exam with your caregiver. HOME CARE INSTRUCTIONS   Do not smoke. Smoking is a common cause of shortness of breath. Ask for help to stop smoking.   Avoid being around chemicals that may bother your breathing, such as paint fumes or dust.   Rest as needed. Slowly begin your usual activities.   If medications were prescribed, take them as directed for the full length of time directed. This includes oxygen and any inhaled medications, if prescribed.   It is very important that you follow up with your caregiver or other physician as directed. Waiting to do so or failure to follow up could result in worsening of your condition, possible disability, or death.   Be sure you understand what to do or who to call if your shortness of breath worsens.  SEEK MEDICAL CARE IF:   Your condition does not improve in the time expected.   You have a hard time doing your normal activities even with rest.   You have any side effects from or problems with medications prescribed.  SEEK IMMEDIATE MEDICAL CARE IF:   You feel your shortness of breath is getting worse.   You feel lightheaded, faint or develop a  cough not controlled with medications.   You start coughing up blood.   You get pain with breathing.   You get chest pain or pain in your arms, shoulders or belly (abdomen).   You have a fever.   You are unable to walk up stairs or exercise the way you normally can.  MAKE SURE YOU:   Understand these instructions.   Will watch your condition.   Will get help right away if you are not doing well or get worse.  Document Released: 03/04/2004 Document Revised: 10/07/2010 Document Reviewed: 06/12/2009 [facility] Patient Information 2012 [place], [place].  Dyspnea Shortness of breath (dyspnea) is the feeling of uneasy breathing. Dyspnea should be evaluated promptly. DIAGNOSIS  Many tests may be done to find why you are having shortness of breath. Tests may include:  A chest X-ray.   A lung function test.   Blood tests.   Recordings of the electrical activity of the heart (electrocardiogram).   Exercise testing.   Sound wave images of the heart (a cardiac echocardiogram).   A scan.  A cause for your shortness of breath may not be identified initially. In this case, it is important to have a follow-up exam with your caregiver. HOME CARE INSTRUCTIONS   Do not smoke. Smoking is a common cause of shortness of breath. Ask for help to stop smoking.   Avoid being around chemicals that may bother your breathing, such as paint fumes or dust.  Rest as needed. Slowly begin your usual activities.   If medications were prescribed, take them as directed for the full length of time directed. This includes oxygen and any inhaled medications, if prescribed.   It is very important that you follow up with your caregiver or other physician as directed. Waiting to do so or failure to follow up could result in worsening of your condition, possible disability, or death.   Be sure you understand what to do or who to call if your shortness of breath worsens.  SEEK MEDICAL CARE IF:   Your  condition does not improve in the time expected.   You have a hard time doing your normal activities even with rest.   You have any side effects from or problems with medications prescribed.  SEEK IMMEDIATE MEDICAL CARE IF:   You feel your shortness of breath is getting worse.   You feel lightheaded, faint or develop a cough not controlled with medications.   You start coughing up blood.   You get pain with breathing.   You get chest pain or pain in your arms, shoulders or belly (abdomen).   You have a fever.   You are unable to walk up stairs or exercise the way you normally can.  MAKE SURE YOU:   Understand these instructions.   Will watch your condition.   Will get help right away if you are not doing well or get worse.  Document Released: 03/04/2004 Document Revised: 10/07/2010 Document Reviewed: 06/12/2009 [facility] Patient Information 2012 [place], [place].

## 2011-07-23 NOTE — ED Provider Notes (Signed)
History     CSN: [id number]  Arrival date & time 07/19/11  2154   First MD Initiated Contact with Patient 07/20/11 0054      Chief Complaint  Patient presents with  . Headache    (Consider location/radiation/quality/duration/timing/severity/associated sxs/prior treatment) HPI 26 yo female presents to the ER with complaint of headache.  Headache is like previous headaches she's had in the past, no fever, no n/v, no photo/phonophobia, no weakness, numbness or neuro deficits.  HA came on gradually.  She has been unable resolve with OTC medications.  Pt also c/o shortness of breath just prior to arrival and while in the waiting room with the sense of pressure in her right chest.  Pt reports she felt like she could not take in enough air.  Pt denies h/o asthma, panic attacks.  Pt reports she had tingling in her fingers when sob.  No perioral numnbess.  She still feels slightly short of breath but better than prior. Pt is on OCP, nonsmoker.  Mother with h/o dvt and pe.  She denies h/o htn. Past Medical History  Diagnosis Date  . Urinary tract bacterial infections     Past Surgical History  Procedure Date  . Cesarean section   . Ankle surgery     No family history on file.  History  Substance Use Topics  . Smoking status: Never Smoker   . Smokeless tobacco: Not on file  . Alcohol Use: No    OB History    Grav Para Term Preterm Abortions TAB SAB Ect Mult Living                  Review of Systems  All other systems reviewed and are negative.    Allergies  Latex  Home Medications   Current Outpatient Rx  Name Route Sig Dispense Refill  . LEVONORGEST-ETH ESTRAD 91-DAY 0.15-0.03 MG PO TABS Oral Take 1 tablet by mouth daily. 1 Package 4    BP 132/64  Pulse 86  Temp 98.3 F (36.8 C) (Oral)  Resp 16  SpO2 100%  LMP 07/12/2011  Physical Exam  Nursing note and vitals reviewed. Constitutional: She is oriented to person, place, and time. She appears well-developed and  well-nourished. She appears distressed (moderate distress due to pain).       obese  HENT:  Head: Normocephalic and atraumatic.  Right Ear: External ear normal.  Left Ear: External ear normal.  Nose: Nose normal.  Mouth/Throat: Oropharynx is clear and moist.  Eyes: Conjunctivae and EOM are normal. Pupils are equal, round, and reactive to light.  Neck: Normal range of motion. Neck supple. No JVD present. No tracheal deviation present. No thyromegaly present.  Cardiovascular: Normal rate, regular rhythm, normal heart sounds and intact distal pulses.  Exam reveals no gallop and no friction rub.   No murmur heard. Pulmonary/Chest: Effort normal and breath sounds normal. No stridor. No respiratory distress. She has no wheezes. She has no rales. She exhibits no tenderness.  Abdominal: Soft. Bowel sounds are normal. She exhibits no distension and no mass. There is no tenderness. There is no rebound and no guarding.  Musculoskeletal: Normal range of motion. She exhibits no edema and no tenderness.  Lymphadenopathy:    She has no cervical adenopathy.  Neurological: She is oriented to person, place, and time. She has normal reflexes. No cranial nerve deficit. She exhibits normal muscle tone. Coordination normal.  Skin: Skin is dry. No rash noted. She is not diaphoretic. No erythema. No  pallor.  Psychiatric: She has a normal mood and affect. Her behavior is normal. Judgment and thought content normal.    ED Course  Procedures (including critical care time)  Labs Reviewed  CBC - Abnormal; Notable for the following:    WBC 14.3 (*)     All other components within normal limits  DIFFERENTIAL - Abnormal; Notable for the following:    Neutro Abs 10.6 (*)     All other components within normal limits  BASIC METABOLIC PANEL - Abnormal; Notable for the following:    Glucose, Bld 104 (*)     All other components within normal limits  D-DIMER, QUANTITATIVE - Abnormal; Notable for the following:     D-Dimer, Quant 0.81 (*)     All other components within normal limits  LAB REPORT - SCANNED   No results found.   1. Migraine   2. Dyspnea       MDM  26 yo female with headache, dyspnea.  She is at risk for pe given family history, possible hypercoagulable disorder as well as on ocp.  Ddimer elevated, but CTA negative.  Pt feeling better after headache cocktail.  Will d/c home to f/u with DR Domingo Cocking, MD 07/23/11 1540

## 2011-08-10 ENCOUNTER — Emergency Department (HOSPITAL_BASED_OUTPATIENT_CLINIC_OR_DEPARTMENT_OTHER)
Admission: EM | Admit: 2011-08-10 | Discharge: 2011-08-10 | Disposition: A | Discharge status: Home / Self Care | Payer: Managed Care, Other (non HMO) | Attending: Emergency Medicine | Admitting: Emergency Medicine

## 2011-08-10 ENCOUNTER — Encounter (HOSPITAL_BASED_OUTPATIENT_CLINIC_OR_DEPARTMENT_OTHER): Payer: Self-pay

## 2011-08-10 DIAGNOSIS — H9209 Otalgia, unspecified ear: Secondary | ICD-10-CM | POA: Insufficient documentation

## 2011-08-10 DIAGNOSIS — R0982 Postnasal drip: Secondary | ICD-10-CM

## 2011-08-10 MED ORDER — IBUPROFEN 800 MG PO TABS
ORAL_TABLET | ORAL | Status: AC
  Administered 2011-08-10: 800 mg via ORAL
  Filled 2011-08-10: qty 1

## 2011-08-10 MED ORDER — FLUTICASONE PROPIONATE 50 MCG/ACT NA SUSP: 2.0000 | Freq: Every day | NASAL | Status: DC

## 2011-08-10 MED ORDER — IBUPROFEN 800 MG PO TABS
800.0000 mg | ORAL_TABLET | Freq: Once | ORAL | Status: AC
  Administered 2011-08-10: 800 mg via ORAL

## 2011-08-10 MED ORDER — IBUPROFEN 600 MG PO TABS: 600.0000 mg | ORAL_TABLET | Freq: Four times a day (QID) | ORAL | Status: AC | PRN

## 2011-08-10 MED ORDER — DESLORATADINE 5 MG PO TABS: 5.0000 mg | ORAL_TABLET | Freq: Every day | ORAL | Status: DC

## 2011-08-10 NOTE — ED Notes (Signed)
Left ear ache x 1 week

## 2011-08-10 NOTE — ED Notes (Signed)
MD at bedside. 

## 2011-08-10 NOTE — ED Provider Notes (Signed)
History     CSN: [id number]  Arrival date & time 08/10/11  2144   First MD Initiated Contact with Patient 08/10/11 2305      Chief Complaint  Patient presents with  . Otalgia    (Consider location/radiation/quality/duration/timing/severity/associated sxs/prior treatment) Patient is a 26 y.o. female presenting with ear pain. The history is provided by the patient. No language interpreter was used.  Otalgia This is a new problem. The current episode started more than 2 days ago. There is pain in the left ear. The problem occurs constantly. The problem has not changed since onset.There has been no fever. The pain is at a severity of 10/10. The pain is severe. Pertinent negatives include no ear discharge, no headaches, no hearing loss, no rhinorrhea, no abdominal pain, no diarrhea, no vomiting, no neck pain, no cough and no rash. Her past medical history does not include hearing loss.  Also feels like her face is swollen on that side  Past Medical History  Diagnosis Date  . Urinary tract bacterial infections     Past Surgical History  Procedure Date  . Cesarean section   . Ankle surgery     No family history on file.  History  Substance Use Topics  . Smoking status: Never Smoker   . Smokeless tobacco: Not on file  . Alcohol Use: No    OB History    Grav Para Term Preterm Abortions TAB SAB Ect Mult Living                  Review of Systems  Constitutional: Negative for fever.  HENT: Positive for ear pain. Negative for hearing loss, rhinorrhea, neck pain and ear discharge.   Respiratory: Negative for cough and shortness of breath.   Cardiovascular: Positive for chest pain.  Gastrointestinal: Negative for vomiting, abdominal pain and diarrhea.  Skin: Negative for rash.  Neurological: Negative for headaches.  All other systems reviewed and are negative.    Allergies  Eggs or egg-derived products and Latex  Home Medications   Current Outpatient Rx  Name Route Sig  Dispense Refill  . ACETAMINOPHEN ER 650 MG PO TBCR Oral Take 1,300 mg by mouth every 8 (eight) hours as needed. Patient used this medication for her ear pain.    . CEPHALEXIN 500 MG PO CAPS Oral Take 500 mg by mouth 2 (two) times daily.    . IBUPROFEN 800 MG PO TABS Oral Take 800 mg by mouth every 8 (eight) hours as needed. Patient used this medication for her ear pain.    Marland Kitchen LEVONORGEST-ETH ESTRAD 91-DAY 0.15-0.03 MG PO TABS Oral Take 1 tablet by mouth daily. 1 Package 4  . NAPROXEN SODIUM 220 MG PO TABS Oral Take 440 mg by mouth 2 (two) times daily with a meal. Patient used this medication for her ear pain.      BP 167/80  Pulse 80  Temp 98.1 F (36.7 C) (Oral)  Resp 16  Ht 5\' 6"  (1.676 m)  Wt 350 lb (158.759 kg)  BMI 56.49 kg/m2  SpO2 98%  LMP 07/12/2011  Physical Exam  Constitutional: She is oriented to person, place, and time. She appears well-developed and well-nourished. No distress.  HENT:  Head: Normocephalic and atraumatic.  Right Ear: External ear normal. No mastoid tenderness. Tympanic membrane is not injected. No hemotympanum.  Left Ear: External ear normal. No mastoid tenderness. Tympanic membrane is not injected. No hemotympanum.  Mouth/Throat: Oropharynx is clear and moist. No oropharyngeal exudate.  Cobblestoning of the posterior oropharynx  Neck: Normal range of motion. Neck supple.  Cardiovascular: Normal rate and regular rhythm.   Pulmonary/Chest: Effort normal and breath sounds normal. No stridor.  Musculoskeletal: Normal range of motion.  Lymphadenopathy:    She has no cervical adenopathy.  Neurological: She is alert and oriented to person, place, and time.  Skin: Skin is warm and dry.  Psychiatric: She has a normal mood and affect.    ED Course  Procedures (including critical care time)  Labs Reviewed - No data to display No results found.   No diagnosis found.    MDM  Symptoms consistent with PND, suspect inflamation.  No signs of  infection will prescribe nasal spray and decongestants return for worsening pain, fevers, change in voice inability to swallow of any concerns        Lonzie Simmer K Dylin Breeden-Rasch, MD 08/10/11 2318

## 2012-02-25 ENCOUNTER — Encounter: Payer: Self-pay | Admitting: Family Medicine

## 2012-02-25 ENCOUNTER — Ambulatory Visit (INDEPENDENT_AMBULATORY_CARE_PROVIDER_SITE_OTHER): Payer: Managed Care, Other (non HMO) | Admitting: Family Medicine

## 2012-02-25 VITALS — BP 131/80 | HR 87 | Temp 98.8°F | Ht 66.0 in | Wt 365.0 lb

## 2012-02-25 DIAGNOSIS — E669 Obesity, unspecified: Secondary | ICD-10-CM

## 2012-02-25 LAB — LIPID PANEL
Cholesterol: 111 mg/dL (ref 0–200)
HDL: 41 mg/dL (ref >39)
Total CHOL/HDL Ratio: 2.7 Ratio
VLDL: 20 mg/dL (ref 0–40)

## 2012-02-25 LAB — BASIC METABOLIC PANEL
BUN: 8 mg/dL (ref 6–23)
Calcium: 9.5 mg/dL (ref 8.4–10.5)
Potassium: 4.1 mEq/L (ref 3.5–5.3)
Sodium: 137 mEq/L (ref 135–145)

## 2012-02-25 NOTE — Progress Notes (Signed)
Patient ID: Kathleen Andrews, female   DOB: [birth year], 27 y.o.   MRN: [id number] SUBJECTIVE:  27 y.o. female for annual routine checkup/employment required physical. Current Outpatient Prescriptions  Medication Sig Dispense Refill  . desloratadine (CLARINEX) 5 MG tablet Take 1 tablet (5 mg total) by mouth daily.  7 tablet  0  . fluticasone (FLONASE) 50 MCG/ACT nasal spray Place 2 sprays into the nose daily.  16 g  0  . levonorgestrel-ethinyl estradiol (SEASONALE) 0.15-0.03 MG tablet Take 1 tablet by mouth daily.  1 Package  4  . naproxen sodium (ANAPROX) 220 MG tablet Take 440 mg by mouth 2 (two) times daily with a meal. Patient used this medication for her ear pain.       Allergies: Eggs or egg-derived products and Latex  No LMP recorded. (On sesonale when she remembers)  ROS:  Feeling well. No dyspnea or chest pain on exertion.  No abdominal pain, change in bowel habits, black or bloody stools.  No urinary tract symptoms.  No neurological complaints.  OBJECTIVE:  The patient appears well, alert, oriented x 3, in no distress. BP 131/80  Pulse 87  Temp 98.8 F (37.1 C) (Oral)  Ht 5\' 6"  (1.676 m)  Wt 365 lb (165.563 kg)  BMI 58.91 kg/m2 ENT normal.  Neck supple. No adenopathy or thyromegaly. PERLA. Lungs are clear, good air entry, no wheezes, rhonchi or rales. S1 and S2 normal, no murmurs, regular rate and rhythm. Abdomen soft without tenderness, guarding, mass or organomegaly. Extremities show no edema, normal peripheral pulses. Neurological is normal, no focal findings.  ASSESSMENT:  well woman  PLAN:  counseled on family planning choices, weigh reduction FLP per employment request return annually or prn

## 2012-02-28 ENCOUNTER — Telehealth: Payer: Self-pay | Admitting: Family Medicine

## 2012-02-28 NOTE — Telephone Encounter (Signed)
Please let patient know her form has been faxed and a copy is ready to be picked up.

## 2012-03-03 ENCOUNTER — Encounter: Payer: Self-pay | Admitting: Family Medicine

## 2012-04-04 ENCOUNTER — Other Ambulatory Visit: Payer: Self-pay | Admitting: Family Medicine

## 2012-04-04 MED ORDER — LEVONORGEST-ETH ESTRAD 91-DAY 0.15-0.03 MG PO TABS: 1.0000 | ORAL_TABLET | Freq: Every day | ORAL | Status: DC

## 2012-07-21 ENCOUNTER — Ambulatory Visit (INDEPENDENT_AMBULATORY_CARE_PROVIDER_SITE_OTHER): Payer: Managed Care, Other (non HMO) | Admitting: Family Medicine

## 2012-07-21 VITALS — BP 130/87 | HR 85 | Temp 98.5°F | Ht 66.0 in | Wt 357.0 lb

## 2012-07-21 DIAGNOSIS — N912 Amenorrhea, unspecified: Secondary | ICD-10-CM

## 2012-07-21 NOTE — Progress Notes (Signed)
Patient ID: NERISSA CONSTANTIN, female   DOB: September 29, 1985, 29 y.o.   MRN: [id number] Subjective: The patient is a 27 y.o. year old female who presents today for amenorrhea  Last normal was in Dec of 2013, then light in March (is on 12 week cycle), then should have had next on 29th, none since then.  Multiple episodes of unprotected intercourse per week.  On seasonale but regularly misses doses.  Not interested in other contraception.  Patient insists that she has had multiple negative urine pregnancy tests while she was pregnant.  She also reports that her physicians in [place] and at [facility] hospital readily order blood pregnancy tests.  Patient's past medical, social, and family history were reviewed and updated as appropriate. History  Substance Use Topics  . Smoking status: Never Smoker   . Smokeless tobacco: Not on file  . Alcohol Use: No   Objective:  Filed Vitals:   07/21/12 0942  BP: 130/87  Pulse: 85  Temp: 98.5 F (36.9 C)   Gen: NAD, morbidly obese  Assessment/Plan:  Please also see individual problems in problem list for problem-specific plans.

## 2012-07-21 NOTE — Patient Instructions (Signed)
We have agreed to do a blood pregnancy test today.  In the future, we will need a urine sample and to run a urine pregnancy test before we can consider a blood pregnancy test.  You can collect a sample at home and bring it with you. If this is something that you will continue to be worried about, you probably need to find a better method of birth control. Congratulations on how much weight you have lost since your last visit!

## 2012-07-25 DIAGNOSIS — Z793 Long term (current) use of hormonal contraceptives: Secondary | ICD-10-CM | POA: Insufficient documentation

## 2012-07-25 NOTE — Assessment & Plan Note (Signed)
Pt offered blood qualitiative hcg today but informed that, in the future, will need urine prior to any consideration for blood test.  Pt told could bring urine with her.  I placed a future order for upreg in case she needs to drop it off.  Pt became upset and left prior to any test being done.

## 2012-09-21 ENCOUNTER — Telehealth: Payer: Self-pay | Admitting: Sports Medicine

## 2012-09-21 DIAGNOSIS — Z3009 Encounter for other general counseling and advice on contraception: Secondary | ICD-10-CM

## 2012-09-21 NOTE — Telephone Encounter (Signed)
Pt called wanting to talk to a nurse about something. JW

## 2012-09-22 NOTE — Telephone Encounter (Signed)
Seen with child in clinic for [facility].  Acute knee injury.  Cannot be seen in clinic by me in the next one week.  Recommended going to [facility] when her urgent care. Also concern for potential pregnancy.  Has had negative urine pregnancy test in the past with ultrasound evidence of pregnancy.  Will obtain serum pregnancy test today.

## 2012-09-23 LAB — HCG, SERUM, QUALITATIVE: Preg, Serum: NEGATIVE

## 2012-09-25 ENCOUNTER — Telehealth: Payer: Self-pay | Admitting: *Deleted

## 2012-09-25 NOTE — Telephone Encounter (Signed)
Called and informed patient of negative pregnancy test.Nichelle Renwick, Rodena Medin

## 2012-09-25 NOTE — Telephone Encounter (Signed)
Message copied by Jennette Bill on Mon Sep 25, 2012 12:58 PM ------      Message from: Gaspar Bidding D      Created: Mon Sep 25, 2012 12:31 PM       Please call and inform pt of negative pregnancy test ------

## 2012-11-07 ENCOUNTER — Ambulatory Visit: Payer: Managed Care, Other (non HMO) | Admitting: Family Medicine

## 2012-11-07 ENCOUNTER — Encounter: Payer: Self-pay | Admitting: Family Medicine

## 2012-11-07 ENCOUNTER — Ambulatory Visit (INDEPENDENT_AMBULATORY_CARE_PROVIDER_SITE_OTHER): Payer: Managed Care, Other (non HMO) | Admitting: Family Medicine

## 2012-11-07 VITALS — BP 146/78 | HR 102 | Temp 99.0°F | Ht 66.0 in | Wt 360.0 lb

## 2012-11-07 DIAGNOSIS — O0991 Supervision of high risk pregnancy, unspecified, first trimester: Secondary | ICD-10-CM | POA: Insufficient documentation

## 2012-11-07 DIAGNOSIS — Z3480 Encounter for supervision of other normal pregnancy, unspecified trimester: Secondary | ICD-10-CM

## 2012-11-07 DIAGNOSIS — Z3201 Encounter for pregnancy test, result positive: Secondary | ICD-10-CM

## 2012-11-07 DIAGNOSIS — N912 Amenorrhea, unspecified: Secondary | ICD-10-CM

## 2012-11-07 LAB — POCT URINE PREGNANCY: Preg Test, Ur: POSITIVE

## 2012-11-07 NOTE — Assessment & Plan Note (Signed)
Unable to estimate gestational age by history or exam. Referred for U/S. Scheduled for new OB visit with PCP. Discussed prenatal vitamins, alcohol and caffeine abstinence. Medications reviewed for pregnancy contraindications. OB panel ordered.

## 2012-11-07 NOTE — Patient Instructions (Signed)
Thank you for coming in today!  Your pregnancy test was positive today so we are referring you to [facility] to receive an ultrasound.   As you leave, make an appointment to follow up with Dr. Berline Chough for a new OB appointment.   Please feel free to call with any questions or concerns at any time, at [phone].  Congratulations! - Dr. Jarvis Newcomer

## 2012-11-07 NOTE — Progress Notes (Signed)
  Subjective:    Kathleen Andrews is a 27 y.o. female who presents for evaluation of amenorrhea. She believes she could be pregnant. Pregnancy is desired. Sexual Activity: single partner, contraception: none. Current symptoms also include: fatigue and positive home pregnancy test.  Patient's last menstrual period was 02/23/2012. and patient has been off of birth control since May.  She reports being pregnant a total of 8 previous times with miscarriages at variable gestational ages. She has had 3 cesarean sections and 2 live births. Her 2 children are 74 and 90 years of age.    The following portions of the patient's history were reviewed and updated as appropriate: allergies, current medications, past family history, past medical history, past social history, past surgical history and problem list.  Review of Systems Pertinent items are noted in HPI.     Objective:  BP 146/78  Pulse 102  Temp(Src) 99 F (37.2 C) (Oral)  Ht 5\' 6"  (1.676 m)  Wt 360 lb (163.295 kg)  BMI 58.13 kg/m2  LMP 02/23/2012 Gen: Obese 27 y.o.female in NAD HEENT: MMM, posterior oropharynx clear Pulm: Non-labored; CTAB, no wheezes  CV: Regular rate, no murmur appreciated; distal pulses intact/symmetric GI: NormoactiveBS; obese, soft, non-tender, non-distended, no suprapubic pain or mass felt. Exam limited by adiposity.  Skin: No rashes, wounds, ulcers Neuro: A&Ox3, CN II-XII without deficits  Lab Review Urine HCG: positive    Assessment:    Kathleen Andrews is a 27 y.o. female who presents for evaluation of amenorrhea found to have a positive pregnancy test.    Plan:   See problem list for problem-specific plan.

## 2012-11-08 LAB — OBSTETRIC PANEL
Antibody Screen: NEGATIVE
Basophils Relative: 0 % (ref 0–1)
Eosinophils Absolute: 0.2 10*3/uL (ref 0.0–0.7)
Eosinophils Relative: 2 % (ref 0–5)
HCT: 40.1 % (ref 36.0–46.0)
Hemoglobin: 13.4 g/dL (ref 12.0–15.0)
Lymphs Abs: 2 10*3/uL (ref 0.7–4.0)
MCH: 27 pg (ref 26.0–34.0)
MCHC: 33.4 g/dL (ref 30.0–36.0)
MCV: 80.8 fL (ref 78.0–100.0)
Monocytes Absolute: 0.5 10*3/uL (ref 0.1–1.0)
Monocytes Relative: 5 % (ref 3–12)
Neutrophils Relative %: 71 % (ref 43–77)
RBC: 4.96 MIL/uL (ref 3.87–5.11)
Rh Type: POSITIVE

## 2012-11-09 ENCOUNTER — Encounter (HOSPITAL_COMMUNITY): Payer: Self-pay | Admitting: Emergency Medicine

## 2012-11-09 ENCOUNTER — Ambulatory Visit: Payer: Managed Care, Other (non HMO) | Admitting: Sports Medicine

## 2012-11-09 ENCOUNTER — Emergency Department (HOSPITAL_COMMUNITY)
Admission: EM | Admit: 2012-11-09 | Discharge: 2012-11-10 | Disposition: A | Discharge status: Home / Self Care | Payer: Managed Care, Other (non HMO) | Attending: Emergency Medicine | Admitting: Emergency Medicine

## 2012-11-09 DIAGNOSIS — Z8619 Personal history of other infectious and parasitic diseases: Secondary | ICD-10-CM | POA: Insufficient documentation

## 2012-11-09 DIAGNOSIS — R05 Cough: Secondary | ICD-10-CM | POA: Insufficient documentation

## 2012-11-09 DIAGNOSIS — Z87440 Personal history of urinary (tract) infections: Secondary | ICD-10-CM | POA: Insufficient documentation

## 2012-11-09 DIAGNOSIS — O9989 Other specified diseases and conditions complicating pregnancy, childbirth and the puerperium: Secondary | ICD-10-CM | POA: Insufficient documentation

## 2012-11-09 DIAGNOSIS — R059 Cough, unspecified: Secondary | ICD-10-CM | POA: Insufficient documentation

## 2012-11-09 DIAGNOSIS — R000 Tachycardia, unspecified: Secondary | ICD-10-CM | POA: Insufficient documentation

## 2012-11-09 DIAGNOSIS — Z91040 Latex allergy status: Secondary | ICD-10-CM | POA: Insufficient documentation

## 2012-11-09 DIAGNOSIS — R0982 Postnasal drip: Secondary | ICD-10-CM | POA: Insufficient documentation

## 2012-11-09 NOTE — ED Notes (Signed)
Pt states she has had a cough for the past two weeks  Pt states she started seeing blood in her sputum  Pt states she is pregnant and is not able to take a lot of OTC meds so she came here  Pt states she coughs so hard it causes her to have vomiting

## 2012-11-09 NOTE — ED Provider Notes (Signed)
CSN: [id number]     Arrival date & time 11/09/12  2310 History   First MD Initiated Contact with Patient 11/09/12 2342     Chief Complaint  Patient presents with  . Cough   (Consider location/radiation/quality/duration/timing/severity/associated sxs/prior Treatment) HPI Comments: 27 year old pregnant female presents to the emergency department complaining of cough x3 weeks. Cough is dry, however tonight she had a small amount of blood tinged sputum. She has not tried any alleviating factors for her cough as she is pregnant. Today she was coughing so hard that she vomited once, denies abdominal pain or nausea. Denies fever, chills, chest pain, shortness of breath, congestion.  Patient is a 27 y.o. female presenting with cough. The history is provided by the patient.  Cough Associated symptoms: no chest pain, no chills, no fever, no shortness of breath and no wheezing     Past Medical History  Diagnosis Date  . Urinary tract bacterial infections    Past Surgical History  Procedure Laterality Date  . Cesarean section    . Ankle surgery     No family history on file. History  Substance Use Topics  . Smoking status: Never Smoker   . Smokeless tobacco: Not on file  . Alcohol Use: No   OB History   Grav Para Term Preterm Abortions TAB SAB Ect Mult Living                 Review of Systems  Constitutional: Negative for fever and chills.  HENT: Negative for congestion.   Respiratory: Positive for cough. Negative for shortness of breath and wheezing.   Cardiovascular: Negative for chest pain.  All other systems reviewed and are negative.    Allergies  Eggs or egg-derived products and Latex  Home Medications  No current outpatient prescriptions on file. BP 136/74  Pulse 104  Temp(Src) 98.9 F (37.2 C) (Oral)  Resp 15  Ht 5\' 6"  (1.676 m)  Wt 350 lb (158.759 kg)  BMI 56.52 kg/m2  SpO2 99%  LMP 02/23/2012 Physical Exam  Nursing note and vitals reviewed. Constitutional:  She is oriented to person, place, and time. She appears well-developed and well-nourished. No distress.  Obese  HENT:  Head: Normocephalic and atraumatic.  Nose: Mucosal edema present. Right sinus exhibits no maxillary sinus tenderness and no frontal sinus tenderness. Left sinus exhibits no maxillary sinus tenderness and no frontal sinus tenderness.  Mouth/Throat: Oropharynx is clear and moist.  Post nasal drip present.  Eyes: Conjunctivae are normal.  Neck: Normal range of motion. Neck supple.  Cardiovascular: Regular rhythm and normal heart sounds.  Tachycardia present.   Tachycardia ~105  Pulmonary/Chest: Effort normal and breath sounds normal. No respiratory distress. She has no wheezes. She has no rales.  Abdominal: Soft. Bowel sounds are normal. There is no tenderness.  Musculoskeletal: Normal range of motion. She exhibits no edema.  Neurological: She is alert and oriented to person, place, and time.  Skin: Skin is warm and dry. She is not diaphoretic.  Psychiatric: She has a normal mood and affect. Her behavior is normal.    ED Course  Procedures (including critical care time) Labs Review Labs Reviewed - No data to display Imaging Review No results found.  MDM   1. Cough   2. Post-nasal drip    Patient with cough x 3 weeks. She is well appearing and in NAD. Afebrile, mild tachycardia, vitals otherwise normal. O2 sat 99% on RA. Post nasal drip and mucosal edema on exam, otherwise unremarkable. Lungs  clear. Pregnant. Advised nasal saline rinses. F/u with PCP. Return precautions given. Patient states understanding of treatment care plan and is agreeable.     Trevor Mace, PA-C 11/10/12 0003

## 2012-11-10 ENCOUNTER — Telehealth: Payer: Self-pay | Admitting: Family Medicine

## 2012-11-10 DIAGNOSIS — N390 Urinary tract infection, site not specified: Secondary | ICD-10-CM

## 2012-11-10 LAB — CULTURE, OB URINE

## 2012-11-10 MED ORDER — CEPHALEXIN 500 MG PO CAPS: 500.0000 mg | ORAL_CAPSULE | Freq: Two times a day (BID) | ORAL | Status: DC

## 2012-11-10 NOTE — Telephone Encounter (Signed)
Can you please notify the patient that I have sent a prescription for keflex for an asymptomatic urinary tract infection.

## 2012-11-10 NOTE — ED Provider Notes (Signed)
Medical screening examination/treatment/procedure(s) were performed by non-physician practitioner and as supervising physician I was immediately available for consultation/collaboration.   Gwyneth Sprout, MD 11/10/12 [phone]

## 2012-11-13 ENCOUNTER — Ambulatory Visit (HOSPITAL_COMMUNITY)
Admission: RE | Admit: 2012-11-13 | Discharge: 2012-11-13 | Disposition: A | Discharge status: Home / Self Care | Payer: Managed Care, Other (non HMO) | Source: Ambulatory Visit | Attending: Family Medicine | Admitting: Family Medicine

## 2012-11-13 DIAGNOSIS — N912 Amenorrhea, unspecified: Secondary | ICD-10-CM

## 2012-11-13 DIAGNOSIS — Z3201 Encounter for pregnancy test, result positive: Secondary | ICD-10-CM

## 2012-11-13 DIAGNOSIS — E669 Obesity, unspecified: Secondary | ICD-10-CM | POA: Insufficient documentation

## 2012-11-13 DIAGNOSIS — Z3689 Encounter for other specified antenatal screening: Secondary | ICD-10-CM | POA: Insufficient documentation

## 2012-11-16 ENCOUNTER — Ambulatory Visit (INDEPENDENT_AMBULATORY_CARE_PROVIDER_SITE_OTHER): Payer: Managed Care, Other (non HMO) | Admitting: Sports Medicine

## 2012-11-16 ENCOUNTER — Other Ambulatory Visit (HOSPITAL_COMMUNITY)
Admission: RE | Admit: 2012-11-16 | Discharge: 2012-11-16 | Disposition: A | Discharge status: Home / Self Care | Payer: Managed Care, Other (non HMO) | Source: Ambulatory Visit | Attending: Sports Medicine | Admitting: Sports Medicine

## 2012-11-16 ENCOUNTER — Telehealth: Payer: Self-pay | Admitting: Sports Medicine

## 2012-11-16 ENCOUNTER — Ambulatory Visit (HOSPITAL_COMMUNITY)
Admission: RE | Admit: 2012-11-16 | Discharge: 2012-11-16 | Disposition: A | Discharge status: Home / Self Care | Payer: Managed Care, Other (non HMO) | Source: Ambulatory Visit | Attending: Sports Medicine | Admitting: Sports Medicine

## 2012-11-16 ENCOUNTER — Encounter: Payer: Self-pay | Admitting: Sports Medicine

## 2012-11-16 VITALS — BP 131/89 | Temp 98.1°F | Wt 361.7 lb

## 2012-11-16 DIAGNOSIS — O0990 Supervision of high risk pregnancy, unspecified, unspecified trimester: Secondary | ICD-10-CM

## 2012-11-16 DIAGNOSIS — N390 Urinary tract infection, site not specified: Secondary | ICD-10-CM

## 2012-11-16 DIAGNOSIS — I471 Supraventricular tachycardia, unspecified: Secondary | ICD-10-CM

## 2012-11-16 DIAGNOSIS — O0991 Supervision of high risk pregnancy, unspecified, first trimester: Secondary | ICD-10-CM

## 2012-11-16 DIAGNOSIS — Z113 Encounter for screening for infections with a predominantly sexual mode of transmission: Secondary | ICD-10-CM | POA: Insufficient documentation

## 2012-11-16 DIAGNOSIS — R000 Tachycardia, unspecified: Secondary | ICD-10-CM

## 2012-11-16 DIAGNOSIS — E669 Obesity, unspecified: Secondary | ICD-10-CM

## 2012-11-16 LAB — POCT URINALYSIS DIPSTICK
Bilirubin, UA: NEGATIVE
Blood, UA: NEGATIVE
Glucose, UA: NEGATIVE
Ketones, UA: NEGATIVE
Leukocytes, UA: NEGATIVE
Nitrite, UA: POSITIVE
Protein, UA: NEGATIVE
Spec Grav, UA: 1.02
Urobilinogen, UA: 0.2
pH, UA: 8

## 2012-11-16 LAB — POCT UA - MICROSCOPIC ONLY

## 2012-11-16 MED ORDER — PRENATABS FA PO TABS: 1.0000 | ORAL_TABLET | Freq: Every day | ORAL | Status: DC

## 2012-11-16 MED ORDER — FOLIC ACID 1 MG PO TABS: 1.0000 mg | ORAL_TABLET | Freq: Every day | ORAL | Status: DC

## 2012-11-16 NOTE — Patient Instructions (Signed)
It was nice to see you today. I am not able to provide you with the appropriate level of care for your pregnancy and request you be seen by an OB practice in town of your choice.  We can help arrange this if you would like and ensure your records are sent to the correct location.    Regarding your dizziness and lightheadedness please drink plenty of water.
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
## 2012-11-16 NOTE — Telephone Encounter (Signed)
Pt called and was seen 10/9 and Dr. Berline Chough suggested she F/U with a different doctor. She needs more information on what she should do. She would like Dr. Berline Chough to call her. JW

## 2012-11-17 ENCOUNTER — Encounter: Payer: Self-pay | Admitting: Sports Medicine

## 2012-11-17 DIAGNOSIS — R000 Tachycardia, unspecified: Secondary | ICD-10-CM | POA: Insufficient documentation

## 2012-11-17 NOTE — Assessment & Plan Note (Signed)
Pierce B. sinus tachycardia.  Likely dehydration.  Encouraged increased by mouth intake. Consider referral to cardiology if continues.

## 2012-11-17 NOTE — Assessment & Plan Note (Addendum)
Status post Keflex at last visit. Urine GC chlamydia sent today so dirty urine but will reculture.  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  Will re-treat and ensure completion of course but likely undertreated with only BID dosing > Needs recheck at follow up appointment and consider daily prophylaxis if pt did complete last course.  Will contact patient.

## 2012-11-17 NOTE — Telephone Encounter (Signed)
Patient called and requested we refer her to high risk clinic at [facility].  She was appreciative and is expecting a phone call with an appointment once we are able to arrange this.

## 2012-11-17 NOTE — Assessment & Plan Note (Addendum)
Super morbid obesity, no history of diabetes

## 2012-11-17 NOTE — Assessment & Plan Note (Signed)
Patient has multiple reasons but she is beyond the scope of family medicine for her OB care.  After discussing this fully with Dr. Deirdre Priest, Dr. Mauricio Po, Dr. Lum Babe, I have discussed with [name] for her to be able to receive the care that is appropriate for her that she be followed in a high risk clinic exclusively.  She was quite upset initially but has agreed to be seen at the [facility] high risk clinic for her ongoing care.  She will need referral to MFM for further genetic counseling.    - Dating is also a concern.  She has not had a period since January but believes she had a single day of spotting in early September.  She does not think contraception could have occurred based on lack of sexual activity for the weeks surrounding the estimated date of conception on ultrasound.  Consider repeat ultrasound with integrated screen or earlier.  Exam deferred today but would also be of low yield given body habitus.

## 2012-11-20 ENCOUNTER — Telehealth: Payer: Self-pay | Admitting: *Deleted

## 2012-11-20 LAB — CULTURE, OB URINE: Colony Count: >=100000

## 2012-11-20 MED ORDER — CEPHALEXIN 500 MG PO CAPS: 500.0000 mg | ORAL_CAPSULE | Freq: Four times a day (QID) | ORAL | Status: DC

## 2012-11-20 NOTE — Telephone Encounter (Signed)
Message copied by Farrell Ours on Mon Nov 20, 2012 11:38 AM ------      Message from: Gaspar Bidding D      Created: Mon Nov 20, 2012  9:47 AM       Rx for Keflex 4Xx/day dosing sent in to pharmacy      Please CALL AND INFORM PATIENT OF RESULTS.        She has been scheduled to see Dr. Shawnie Pons at [facility] later this month ------

## 2012-11-20 NOTE — Telephone Encounter (Signed)
Spoke with patient and informed her of below 

## 2012-11-20 NOTE — Addendum Note (Signed)
Addended by: Gaspar Bidding D on: 11/20/2012 09:46 AM   Modules accepted: Orders

## 2012-11-26 LAB — OB RESULTS CONSOLE GC/CHLAMYDIA
Chlamydia: NEGATIVE
Gonorrhea: NEGATIVE

## 2012-11-27 ENCOUNTER — Encounter: Payer: Self-pay | Admitting: Sports Medicine

## 2012-12-07 ENCOUNTER — Encounter: Payer: Self-pay | Admitting: Family Medicine

## 2012-12-07 ENCOUNTER — Other Ambulatory Visit: Payer: Self-pay | Admitting: Family Medicine

## 2012-12-07 ENCOUNTER — Ambulatory Visit (INDEPENDENT_AMBULATORY_CARE_PROVIDER_SITE_OTHER): Payer: Managed Care, Other (non HMO) | Admitting: Family Medicine

## 2012-12-07 VITALS — BP 159/83 | Temp 97.1°F | Wt 368.8 lb

## 2012-12-07 DIAGNOSIS — O34219 Maternal care for unspecified type scar from previous cesarean delivery: Secondary | ICD-10-CM

## 2012-12-07 DIAGNOSIS — E669 Obesity, unspecified: Secondary | ICD-10-CM

## 2012-12-07 DIAGNOSIS — O358XX0 Maternal care for other (suspected) fetal abnormality and damage, not applicable or unspecified: Secondary | ICD-10-CM

## 2012-12-07 DIAGNOSIS — O99210 Obesity complicating pregnancy, unspecified trimester: Secondary | ICD-10-CM | POA: Insufficient documentation

## 2012-12-07 DIAGNOSIS — O09291 Supervision of pregnancy with other poor reproductive or obstetric history, first trimester: Secondary | ICD-10-CM

## 2012-12-07 DIAGNOSIS — O99340 Other mental disorders complicating pregnancy, unspecified trimester: Secondary | ICD-10-CM

## 2012-12-07 DIAGNOSIS — O99211 Obesity complicating pregnancy, first trimester: Secondary | ICD-10-CM

## 2012-12-07 DIAGNOSIS — R000 Tachycardia, unspecified: Secondary | ICD-10-CM

## 2012-12-07 DIAGNOSIS — N390 Urinary tract infection, site not specified: Secondary | ICD-10-CM

## 2012-12-07 DIAGNOSIS — O2621 Pregnancy care for patient with recurrent pregnancy loss, first trimester: Secondary | ICD-10-CM

## 2012-12-07 DIAGNOSIS — O358XX1 Maternal care for other (suspected) fetal abnormality and damage, fetus 1: Secondary | ICD-10-CM

## 2012-12-07 DIAGNOSIS — O2620 Pregnancy care for patient with recurrent pregnancy loss, unspecified trimester: Secondary | ICD-10-CM

## 2012-12-07 DIAGNOSIS — O0991 Supervision of high risk pregnancy, unspecified, first trimester: Secondary | ICD-10-CM

## 2012-12-07 DIAGNOSIS — O09299 Supervision of pregnancy with other poor reproductive or obstetric history, unspecified trimester: Secondary | ICD-10-CM

## 2012-12-07 DIAGNOSIS — Z3682 Encounter for antenatal screening for nuchal translucency: Secondary | ICD-10-CM

## 2012-12-07 MED ORDER — CEPHALEXIN 500 MG PO CAPS: 500.0000 mg | ORAL_CAPSULE | Freq: Four times a day (QID) | ORAL | Status: DC

## 2012-12-07 NOTE — Patient Instructions (Signed)
Pregnancy - First Trimester During sexual intercourse, millions of sperm go into the vagina. Only 1 sperm will penetrate and fertilize the female egg while it is in the Fallopian tube. One week later, the fertilized egg implants into the wall of the uterus. An embryo begins to develop into a baby. At 6 to 8 weeks, the eyes and face are formed and the heartbeat can be seen on ultrasound. At the end of 12 weeks (first trimester), all the baby's organs are formed. Now that you are pregnant, you will want to do everything you can to have a healthy baby. Two of the most important things are to get good prenatal care and follow your caregiver's instructions. Prenatal care is all the medical care you receive before the baby's birth. It is given to prevent, find, and treat problems during the pregnancy and childbirth. PRENATAL EXAMS  During prenatal visits, your weight, blood pressure, and urine are checked. This is done to make sure you are healthy and progressing normally during the pregnancy.  A pregnant woman should gain 25 to 35 pounds during the pregnancy. However, if you are overweight or underweight, your caregiver will advise you regarding your weight.  Your caregiver will ask and answer questions for you.  Blood work, cervical cultures, other necessary tests, and a Pap test are done during your prenatal exams. These tests are done to check on your health and the probable health of your baby. Tests are strongly recommended and done for HIV with your permission. This is the virus that causes AIDS. These tests are done because medicines can be given to help prevent your baby from being born with this infection should you have been infected without knowing it. Blood work is also used to find out your blood type, previous infections, and follow your blood levels (hemoglobin).  Low hemoglobin (anemia) is common during pregnancy. Iron and vitamins are given to help prevent this. Later in the pregnancy,  blood tests for diabetes will be done along with any other tests if any problems develop.  You may need other tests to make sure you and the baby are doing well. CHANGES DURING THE FIRST TRIMESTER  Your body goes through many changes during pregnancy. They vary from person to person. Talk to your caregiver about changes you notice and are concerned about. Changes can include:  Your menstrual period stops.  The egg and sperm carry the genes that determine what you look like. Genes from you and your partner are forming a baby. The female genes determine whether the baby is a boy or a girl.  Your body increases in girth and you may feel bloated.  Feeling sick to your stomach (nauseous) and throwing up (vomiting). If the vomiting is uncontrollable, call your caregiver.  Your breasts will begin to enlarge and become tender.  Your nipples may stick out more and become darker.  The need to urinate more. Painful urination may mean you have a bladder infection.  Tiring easily.  Loss of appetite.  Cravings for certain kinds of food.  At first, you may gain or lose a couple of pounds.  You may have changes in your emotions from day to day (excited to be pregnant or concerned something may go wrong with the pregnancy and baby).  You may have more vivid and strange dreams. HOME CARE INSTRUCTIONS   It is very important to avoid all smoking, alcohol and non-prescribed drugs during your pregnancy. These affect the formation and growth of the baby.   Avoid chemicals while pregnant to ensure the delivery of a healthy infant.  Start your prenatal visits by the 12th week of pregnancy. They are usually scheduled monthly at first, then more often in the last 2 months before delivery. Keep your caregiver's appointments. Follow your caregiver's instructions regarding medicine use, blood and lab tests, exercise, and diet.  During pregnancy, you are providing food for you and your baby. Eat regular,  well-balanced meals. Choose foods such as meat, fish, milk and other low fat dairy products, vegetables, fruits, and whole-grain breads and cereals. Your caregiver will tell you of the ideal weight gain.  You can help morning sickness by keeping soda crackers at the bedside. Eat a couple before arising in the morning. You may want to use the crackers without salt on them.  Eating 4 to 5 small meals rather than 3 large meals a day also may help the nausea and vomiting.  Drinking liquids between meals instead of during meals also seems to help nausea and vomiting.  A physical sexual relationship may be continued throughout pregnancy if there are no other problems. Problems may be early (premature) leaking of amniotic fluid from the membranes, vaginal bleeding, or belly (abdominal) pain.  Exercise regularly if there are no restrictions. Check with your caregiver or physical therapist if you are unsure of the safety of some of your exercises. Greater weight gain will occur in the last 2 trimesters of pregnancy. Exercising will help:  Control your weight.  Keep you in shape.  Prepare you for labor and delivery.  Help you lose your pregnancy weight after you deliver your baby.  Wear a good support or jogging bra for breast tenderness during pregnancy. This may help if worn during sleep too.  Ask when prenatal classes are available. Begin classes when they are offered.  Do not use hot tubs, steam rooms, or saunas.  Wear your seat belt when driving. This protects you and your baby if you are in an accident.  Avoid raw meat, uncooked cheese, cat litter boxes, and soil used by cats throughout the pregnancy. These carry germs that can cause birth defects in the baby.  The first trimester is a good time to visit your dentist for your dental health. Getting your teeth cleaned is okay. Use a softer toothbrush and brush gently during pregnancy.  Ask for help if you have financial, counseling, or  nutritional needs during pregnancy. Your caregiver will be able to offer counseling for these needs as well as refer you for other special needs.  Do not take any medicines or herbs unless told by your caregiver.  Inform your caregiver if there is any mental or physical domestic violence.  Make a list of emergency phone numbers of family, friends, hospital, and police and fire departments.  Write down your questions. Take them to your prenatal visit.  Do not douche.  Do not cross your legs.  If you have to stand for long periods of time, rotate you feet or take small steps in a circle.  You may have more vaginal secretions that may require a sanitary pad. Do not use tampons or scented sanitary pads. MEDICINES AND DRUG USE IN PREGNANCY  Take prenatal vitamins as directed. The vitamin should contain 1 milligram of folic acid. Keep all vitamins out of reach of children. Only a couple vitamins or tablets containing iron may be fatal to a baby or young child when ingested.  Avoid use of all medicines, including herbs, over-the-counter medicines, not   prescribed or suggested by your caregiver. Only take over-the-counter or prescription medicines for pain, discomfort, or fever as directed by your caregiver. Do not use aspirin, ibuprofen, or naproxen unless directed by your caregiver.  Let your caregiver also know about herbs you may be using.  Alcohol is related to a number of birth defects. This includes fetal alcohol syndrome. All alcohol, in any form, should be avoided completely. Smoking will cause low birth rate and premature babies.  Street or illegal drugs are very harmful to the baby. They are absolutely forbidden. A baby born to an addicted mother will be addicted at birth. The baby will go through the same withdrawal an adult does.  Let your caregiver know about any medicines that you have to take and for what reason you take them. SEEK MEDICAL CARE IF:  You have any concerns or  worries during your pregnancy. It is better to call with your questions if you feel they cannot wait, rather than worry about them. SEEK IMMEDIATE MEDICAL CARE IF:   An unexplained oral temperature above 102 F (38.9 C) develops, or as your caregiver suggests.  You have leaking of fluid from the vagina (birth canal). If leaking membranes are suspected, take your temperature and inform your caregiver of this when you call.  There is vaginal spotting or bleeding. Notify your caregiver of the amount and how many pads are used.  You develop a bad smelling vaginal discharge with a change in the color.  You continue to feel sick to your stomach (nauseated) and have no relief from remedies suggested. You vomit blood or coffee ground-like materials.  You lose more than 2 pounds of weight in 1 week.  You gain more than 2 pounds of weight in 1 week and you notice swelling of your face, hands, feet, or legs.  You gain 5 pounds or more in 1 week (even if you do not have swelling of your hands, face, legs, or feet).  You get exposed to German measles and have never had them.  You are exposed to fifth disease or chickenpox.  You develop belly (abdominal) pain. Round ligament discomfort is a common non-cancerous (benign) cause of abdominal pain in pregnancy. Your caregiver still must evaluate this.  You develop headache, fever, diarrhea, pain with urination, or shortness of breath.  You fall or are in a car accident or have any kind of trauma.  There is mental or physical violence in your home. Document Released: 01/19/2001 Document Revised: 10/20/2011 Document Reviewed: 07/23/2008 Exit[employer] Patient Information 2014 [employer].  Breastfeeding A change in hormones during your pregnancy causes growth of your breast tissue and an increase in number and size of milk ducts. The hormone prolactin allows proteins, sugars, and fats from your blood supply to make breast milk in your milk-producing  glands. The hormone progesterone prevents breast milk from being released before the birth of your baby. After the birth of your baby, your progesterone level decreases allowing breast milk to be released. Thoughts of your baby, as well as his or her sucking or crying, can stimulate the release of milk from the milk-producing glands. Deciding to breastfeed (nurse) is one of the best choices you can make for you and your baby. The information that follows gives a brief review of the benefits, as well as other important skills to know about breastfeeding. BENEFITS OF BREASTFEEDING For your baby  The first milk (colostrum) helps your baby's digestive system function better.   There are antibodies in   your milk that help your baby fight off infections.   Your baby has a lower incidence of asthma, allergies, and sudden infant death syndrome (SIDS).   The nutrients in breast milk are better for your baby than infant formulas.  Breast milk improves your baby's brain development.   Your baby will have less gas, colic, and constipation.  Your baby is less likely to develop other conditions, such as childhood obesity, asthma, or diabetes mellitus. For you  Breastfeeding helps develop a very special bond between you and your baby.   Breastfeeding is convenient, always available at the correct temperature, and costs nothing.   Breastfeeding helps to burn calories and helps you lose the weight gained during pregnancy.   Breastfeeding makes your uterus contract back down to normal size faster and slows bleeding following delivery.   Breastfeeding mothers have a lower risk of developing osteoporosis or breast or ovarian cancer later in life.  BREASTFEEDING FREQUENCY  A healthy, full-term baby may breastfeed as often as every hour or space his or her feedings to every 3 hours. Breastfeeding frequency will vary from baby to baby.   Newborns should be fed no less than every 2 3 hours during  the day and every 4 5 hours during the night. You should breastfeed a minimum of 8 feedings in a 24 hour period.  Awaken your baby to breastfeed if it has been 3 4 hours since the last feeding.  Breastfeed when you feel the need to reduce the fullness of your breasts or when your newborn shows signs of hunger. Signs that your baby may be hungry include:  Increased alertness or activity.  Stretching.  Movement of the head from side to side.  Movement of the head and opening of the mouth when the corner of the mouth or cheek is stroked (rooting).  Increased sucking sounds, smacking lips, cooing, sighing, or squeaking.  Hand-to-mouth movements.  Increased sucking of fingers or hands.  Fussing.  Intermittent crying.  Signs of extreme hunger will require calming and consoling before you try to feed your baby. Signs of extreme hunger may include:  Restlessness.  A loud, strong cry.  Screaming.  Frequent feeding will help you make more milk and will help prevent problems, such as sore nipples and engorgement of the breasts.  BREASTFEEDING   Whether lying down or sitting, be sure that the baby's abdomen is facing your abdomen.   Support your breast with 4 fingers under your breast and your thumb above your nipple. Make sure your fingers are well away from your nipple and your baby's mouth.   Stroke your baby's lips gently with your finger or nipple.   When your baby's mouth is open wide enough, place all of your nipple and as much of the colored area around your nipple (areola) as possible into your baby's mouth.  More areola should be visible above his or her upper lip than below his or her lower lip.  Your baby's tongue should be between his or her lower gum and your breast.  Ensure that your baby's mouth is correctly positioned around the nipple (latched). Your baby's lips should create a seal on your breast.  Signs that your baby has effectively latched onto your  nipple include:  Tugging or sucking without pain.  Swallowing heard between sucks.  Absent click or smacking sound.  Muscle movement above and in front of his or her ears with sucking.  Your baby must suck about 2 3 minutes in   order to get your milk. Allow your baby to feed on each breast as long as he or she wants. Nurse your baby until he or she unlatches or falls asleep at the first breast, then offer the second breast.  Signs that your baby is full and satisfied include:  A gradual decrease in the number of sucks or complete cessation of sucking.  Falling asleep.  Extension or relaxation of his or her body.  Retention of a small amount of milk in his or her mouth.  Letting go of your breast by himself or herself.  Signs of effective breastfeeding in you include:  Breasts that have increased firmness, weight, and size prior to feeding.  Breasts that are softer after nursing.  Increased milk volume, as well as a change in milk consistency and color by the 5th day of breastfeeding.  Breast fullness relieved by breastfeeding.  Nipples are not sore, cracked, or bleeding.  If needed, break the suction by putting your finger into the corner of your baby's mouth and sliding your finger between his or her gums. Then, remove your breast from his or her mouth.  It is common for babies to spit up a small amount after a feeding.  Babies often swallow air during feeding. This can make babies fussy. Burping your baby between breasts can help with this.  Vitamin D supplements are recommended for babies who get only breast milk.  Avoid using a pacifier during your baby's first 4 6 weeks.  Avoid supplemental feedings of water, formula, or juice in place of breastfeeding. Breast milk is all the food your baby needs. It is not necessary for your baby to have water or formula. Your breasts will make more milk if supplemental feedings are avoided during the early weeks. HOW TO TELL  WHETHER YOUR BABY IS GETTING ENOUGH BREAST MILK Wondering whether or not your baby is getting enough milk is a common concern among mothers. You can be assured that your baby is getting enough milk if:   Your baby is actively sucking and you hear swallowing.   Your baby seems relaxed and satisfied after a feeding.   Your baby nurses at least 8 12 times in a 24 hour time period.  During the first 3 5 days of age:  Your baby is wetting at least 3 5 diapers in a 24 hour period. The urine should be clear and pale yellow.  Your baby is having at least 3 4 stools in a 24 hour period. The stool should be soft and yellow.  At 5 7 days of age, your baby is having at least 3 6 stools in a 24 hour period. The stool should be seedy and yellow by 5 days of age.  Your baby has a weight loss less than 7 10% during the first 3 days of age.  Your baby does not lose weight after 3 7 days of age.  Your baby gains 4 7 ounces each week after he or she is 4 days of age.  Your baby gains weight by 5 days of age and is back to birth weight within 2 weeks. ENGORGEMENT In the first week after your baby is born, you may experience extremely full breasts (engorgement). When engorged, your breasts may feel heavy, warm, or tender to the touch. Engorgement peaks within 24 48 hours after delivery of your baby.  Engorgement may be reduced by:  Continuing to breastfeed.  Increasing the frequency of breastfeeding.  Taking warm showers or   applying warm, moist heat to your breasts just before each feeding. This increases circulation and helps the milk flow.   Gently massaging your breast before and during the feedings. With your fingertips, massage from your chest wall towards your nipple in a circular motion.   Ensuring that your baby empties at least one breast at every feeding. It also helps to start the next feeding on the opposite breast.   Expressing breast milk by hand or by using a breast pump to  empty the breasts if your baby is sleepy, or not nursing well. You may also want to express milk if you are returning to work oryou feel you are getting engorged.  Ensuring your baby is latched on and positioned properly while breastfeeding. If you follow these suggestions, your engorgement should improve in 24 48 hours. If you are still experiencing difficulty, call your lactation consultant or caregiver.  CARING FOR YOURSELF Take care of your breasts.  Bathe or shower daily.   Avoid using soap on your nipples.   Wear a supportive bra. Avoid wearing underwire style bras.  Air dry your nipples for a 3 4minutes after each feeding.   Use only cotton bra pads to absorb breast milk leakage. Leaking of breast milk between feedings is normal.   Use only pure lanolin on your nipples after nursing. You do not need to wash it off before feeding your baby again. Another option is to express a few drops of breast milk and gently massage that milk into your nipples.  Continue breast self-awareness checks. Take care of yourself.  Eat healthy foods. Alternate 3 meals with 3 snacks.  Avoid foods that you notice affect your baby in a bad way.  Drink milk, fruit juice, and water to satisfy your thirst (about 8 glasses a day).   Rest often, relax, and take your prenatal vitamins to prevent fatigue, stress, and anemia.  Avoid chewing and smoking tobacco.  Avoid alcohol and drug use.  Take over-the-counter and prescribed medicine only as directed by your caregiver or pharmacist. You should always check with your caregiver or pharmacist before taking any new medicine, vitamin, or herbal supplement.  Know that pregnancy is possible while breastfeeding. If desired, talk to your caregiver about family planning and safe birth control methods that may be used while breastfeeding. SEEK MEDICAL CARE IF:   You feel like you want to stop breastfeeding or have become frustrated with  breastfeeding.  You have painful breasts or nipples.  Your nipples are cracked or bleeding.  Your breasts are red, tender, or warm.  You have a swollen area on either breast.  You have a fever or chills.  You have nausea or vomiting.  You have drainage from your nipples.  Your breasts do not become full before feedings by the 5th day after delivery.  You feel sad and depressed.  Your baby is too sleepy to eat well.  Your baby is having trouble sleeping.   Your baby is wetting less than 3 diapers in a 24 hour period.  Your baby has less than 3 stools in a 24 hour period.  Your baby's skin or the white part of his or her eyes becomes more yellow.   Your baby is not gaining weight by 5 days of age. MAKE SURE YOU:   Understand these instructions.  Will watch your condition.  Will get help right away if you are not doing well or get worse. Document Released: 01/25/2005 Document Revised: 10/20/2011 Document Reviewed:   09/01/2011 [employer] Patient Information 2014 [employer].  

## 2012-12-07 NOTE — Progress Notes (Signed)
Patient signed ROI for [facility] and for [facility]. Appointment scheduled with Dr. Eden Emms on 12/18/12 at 215 pm at [facility]. MFM appointment scheduled 01/02/13 at 9 am.

## 2012-12-07 NOTE — Progress Notes (Signed)
Nutrition Pt first visit with RD today. Pt is morbidly obese and has significant OB history.  No weight gain today based on reported pregravid wt- slightly below wt gain recommendations.  Pt complains of nausea but no vomiting.  Reports good appetite and adequate intake.  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  Taking folic acid but no PNV.  Discussed wt gain goal of 11-20# and incorporating physical activity.  Pt reports that she was exercising prior to pregnancy but due to recent dizziness and lightheadedness she has not been exercising.  Pt does not have WIC.  F/U in 4-6 weeks.  Melanee Left MPH, RD, LDN 12/07/2012
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
## 2012-12-07 NOTE — Progress Notes (Signed)
Pulse- 110  Pain/pressure-lower abd Pt is upset for unknown reason.  When I was verifing dating of 07/13/12 according recent US pt informed me that she had an [place] that was taken at [facility] and that is the dating that she wanted use.  When I asked for her to give me the dating that she informed from [facility].  Pt refused to give dating and stated "[facility] just does whatever they want to do".  I asked pt again if I could have the dating that [facility] gave her so that we have it correct in our charts.  Pt continued to say, "no, and just leave it the way it is".  Pt refused to do 1 hr glucola testing.  Pt stated "I know I am not a diabetic so I am not going to take the test."  Pt stated that "everything is in the chart from [facility] and they kicked me out because they said I was too high risk."  Pt would not let me take history.

## 2012-12-13 ENCOUNTER — Encounter: Payer: Self-pay | Admitting: *Deleted

## 2012-12-14 ENCOUNTER — Other Ambulatory Visit: Payer: Self-pay

## 2012-12-15 ENCOUNTER — Encounter: Payer: Self-pay | Admitting: Cardiovascular Disease

## 2012-12-15 ENCOUNTER — Encounter: Payer: Self-pay | Admitting: *Deleted

## 2012-12-18 ENCOUNTER — Encounter: Payer: Self-pay | Admitting: Cardiovascular Disease

## 2012-12-18 ENCOUNTER — Ambulatory Visit (INDEPENDENT_AMBULATORY_CARE_PROVIDER_SITE_OTHER): Payer: Managed Care, Other (non HMO) | Admitting: Cardiovascular Disease

## 2012-12-18 VITALS — BP 137/80 | HR 89 | Ht 66.0 in | Wt 364.0 lb

## 2012-12-18 DIAGNOSIS — R0602 Shortness of breath: Secondary | ICD-10-CM

## 2012-12-18 DIAGNOSIS — R0609 Other forms of dyspnea: Secondary | ICD-10-CM

## 2012-12-18 DIAGNOSIS — R55 Syncope and collapse: Secondary | ICD-10-CM

## 2012-12-18 DIAGNOSIS — N390 Urinary tract infection, site not specified: Secondary | ICD-10-CM

## 2012-12-18 DIAGNOSIS — R0600 Dyspnea, unspecified: Secondary | ICD-10-CM | POA: Insufficient documentation

## 2012-12-18 DIAGNOSIS — R0989 Other specified symptoms and signs involving the circulatory and respiratory systems: Secondary | ICD-10-CM

## 2012-12-18 DIAGNOSIS — R000 Tachycardia, unspecified: Secondary | ICD-10-CM

## 2012-12-18 LAB — D-DIMER, QUANTITATIVE: D-Dimer, Quant: 0.41 ug/mL-FEU (ref 0.00–0.48)

## 2012-12-18 LAB — T4, FREE: Free T4: 0.76 ng/dL (ref 0.60–1.60)

## 2012-12-18 LAB — TSH: TSH: 0.74 u[IU]/mL (ref 0.35–5.50)

## 2012-12-18 NOTE — Assessment & Plan Note (Signed)
R/O arrhythmia as indicated above.  Check d- dimer.  Echo to r/o structural heart disease.  F/U OB at risk for gestational DM and HTN

## 2012-12-18 NOTE — Patient Instructions (Addendum)
Your physician recommends that you schedule a follow-up appointment in:   8- 10  WEEKS WITH  DR [facility]  Your physician recommends that you continue on your current medications as directed. Please refer to the Current Medication list given to you today.  Your physician has recommended that you wear an event monitor. Event monitors are medical devices that record the heart's electrical activity. Doctors most often [place] these monitors to diagnose arrhythmias. Arrhythmias are problems with the speed or rhythm of the heartbeat. The monitor is a small, portable device. You can wear one while you do your normal daily activities. This is usually used to diagnose what is causing palpitations/syncope (passing out).  Your physician has requested that you have an echocardiogram. Echocardiography is a painless test that uses sound waves to create images of your heart. It provides your doctor with information about the size and shape of your heart and how well your heart's chambers and valves are working. This procedure takes approximately one hour. There are no restrictions for this procedure.  Your physician has requested that you have an exercise tolerance test. For further information please visit [email or web address] Please also follow instruction sheet, as given. .Your physician recommends that you return for lab work in: TODAY  TSH  FREET4   D DIMER

## 2012-12-18 NOTE — Assessment & Plan Note (Signed)
F/U UA and culture with OB  Indicates compliance with antibiotics

## 2012-12-18 NOTE — Assessment & Plan Note (Signed)
With normal ECG and exam not clear that there is a significant cardiac issue hear.  Not anemic Needs d dimer and TSH.  Symptoms are happening almost daily so event monitor will be useful.  Symptoms with exercise with dyspnea so ETT will help document HR response and functional level

## 2012-12-18 NOTE — Progress Notes (Signed)
Patient ID: Kathleen Andrews, female   DOB: August 12, 1985, 27 y.o.   MRN: [id number]   27 yo obese black female referred for tachycaria , dyspnea and dizzyness.  She is [redacted] weeks pregnant now.  Has two living children 3 and 54 with older one having coarctation and VSD. Previous pregnancies no DM, pre-eclampsia and all by C section. Has had symptoms for about 2 months. HR can race out of blue suddenly Makes her feel dizzy.  She indicates that this happened while at family practice. Reviewed ECG done at time and NSR with no arrhythmia Denies drugs, or excess caffeine Labs ok not anemic but no thyroid. No frank syncope.  Also with marked exertional dyspnea. Normally able to walk but now very dyspnic. Mild chronic LE edema.  No change  Reviewed OB notes and labs from 10/9 and 11/10  Frequent UTI;s with WBC;s and Rx with Keflex for 4 days.  No dysuria now   ROS: Denies fever, malais, weight loss, blurry vision, decreased visual acuity, cough, sputum, SOB, hemoptysis, pleuritic pain, palpitaitons, heartburn, abdominal pain, melena, lower extremity edema, claudication, or rash.  All other systems reviewed and negative   General: Affect appropriate Obese black female  HEENT: normal Neck supple with no adenopathy JVP normal no bruits no thyromegaly Lungs clear with no wheezing and good diaphragmatic motion Heart:  S1/S2 no murmur,rub, gallop or click PMI normal Abdomen: benighn, BS positve, no tenderness, no AAA no bruit.  No HSM or HJR Distal pulses intact with no bruits No edema Neuro non-focal Skin warm and dry [place] beauty symbol on nape of neck  No muscular weakness  Medications Current Outpatient Prescriptions  Medication Sig Dispense Refill  . cephALEXin (KEFLEX) 500 MG capsule Take 1 capsule (500 mg total) by mouth 4 (four) times daily.  28 capsule  0  . folic acid (FOLVITE) 1 MG tablet Take 1 tablet (1 mg total) by mouth daily.      . Prenatal Vit-Fe Fumarate-FA (PRENATABS FA) TABS Take 1  tablet by mouth daily.  30 each  0   No current facility-administered medications for this visit.    Allergies Tomato; Eggs or egg-derived products; and Latex  Family History: Family History  Problem Relation Age of Onset  . Cancer Other   . Hypertension Other   . Hyperlipidemia Other   . Blindness Other   . Deafness Other   . Diabetes Father   . Heart disease Other     mother adopted but reported strong history    Social History: History   Social History  . Marital Status: Single    Spouse Name: N/A    Number of Children: N/A  . Years of Education: N/A   Occupational History  . Not on file.   Social History Main Topics  . Smoking status: Never Smoker   . Smokeless tobacco: Never Used  . Alcohol Use: No  . Drug Use: No  . Sexual Activity: Yes   Other Topics Concern  . Not on file   Social History Narrative   [employer] - Account Manager          Electrocardiogram:  11/16/12  SR rate 101 normal otherwise  Assessment and Plan

## 2012-12-18 NOTE — Addendum Note (Signed)
Addended by: Tonita Phoenix on: 12/18/2012 02:23 PM   Modules accepted: Orders

## 2012-12-21 ENCOUNTER — Ambulatory Visit (INDEPENDENT_AMBULATORY_CARE_PROVIDER_SITE_OTHER): Payer: Managed Care, Other (non HMO) | Admitting: Physician Assistant

## 2012-12-21 ENCOUNTER — Encounter: Payer: Self-pay | Admitting: *Deleted

## 2012-12-21 ENCOUNTER — Encounter (INDEPENDENT_AMBULATORY_CARE_PROVIDER_SITE_OTHER): Payer: Managed Care, Other (non HMO)

## 2012-12-21 ENCOUNTER — Telehealth: Payer: Self-pay | Admitting: Cardiovascular Disease

## 2012-12-21 DIAGNOSIS — R55 Syncope and collapse: Secondary | ICD-10-CM

## 2012-12-21 DIAGNOSIS — R000 Tachycardia, unspecified: Secondary | ICD-10-CM

## 2012-12-21 DIAGNOSIS — R0602 Shortness of breath: Secondary | ICD-10-CM

## 2012-12-21 NOTE — Telephone Encounter (Signed)
Follow Up ° °Pt returning call.  °

## 2012-12-21 NOTE — Telephone Encounter (Signed)
lmtcb Debbie Kenyotta Dorfman RN  

## 2012-12-21 NOTE — Progress Notes (Signed)
Patient ID: Kathleen Andrews, female   DOB: 04/15/1985, 27 y.o.   MRN: [id number] E-Cardio verite 30 day cardiac event monitor applied to patient.

## 2012-12-21 NOTE — Progress Notes (Signed)
Exercise Treadmill Test  Pre-Exercise Testing Evaluation Rhythm: normal sinus  Rate: 85 bpm     Test  Exercise Tolerance Test Ordering MD: Charlton Haws, MD  Interpreting MD: Tereso Newcomer PA-C  Unique Test No: 1  Treadmill:  1  Indication for ETT: exertional dyspnea, syncope  Contraindication to ETT: No   Stress Modality: exercise - treadmill  Cardiac Imaging Performed: non   Protocol: standard Bruce - maximal  Max BP:  201/114  Max MPHR (bpm):  191 85% MPR (bpm):  162  MPHR obtained (bpm):  169 % MPHR obtained:  88  Reached 85% MPHR (min:sec):  3:20 Total Exercise Time (min-sec):  4:43  Workload in METS:  6.6 Borg Scale: 15  Reason ETT Terminated:  exaggerated hypertensive response    ST Segment Analysis At Rest: normal ST segments - no evidence of significant ST depression With Exercise: no evidence of significant ST depression  Other Information Arrhythmia:  No Angina during ETT:  present (1) Quality of ETT:  diagnostic  ETT Interpretation:  normal - no evidence of ischemia by ST analysis  Comments: Fair exercise capacity. Patient did complain of chest pain and dyspnea.  Chest pain resolved in recovery.   Hypertensive BP response to exercise. No ST changes to suggest ischemia. Patient's heart rate was slow to recover.  HR remained in 130s 12:00 into recovery. No exercise induced arrhythmias.   Recommendations: F/u with Dr. Charlton Haws as directed. Signed, Tereso Newcomer, PA-C   12/21/2012 9:42 AM

## 2012-12-22 ENCOUNTER — Encounter: Payer: Self-pay | Admitting: *Deleted

## 2012-12-28 ENCOUNTER — Ambulatory Visit (INDEPENDENT_AMBULATORY_CARE_PROVIDER_SITE_OTHER): Payer: Managed Care, Other (non HMO) | Admitting: Obstetrics & Gynecology

## 2012-12-28 VITALS — BP 138/62 | Temp 97.3°F | Wt 367.0 lb

## 2012-12-28 DIAGNOSIS — O0991 Supervision of high risk pregnancy, unspecified, first trimester: Secondary | ICD-10-CM

## 2012-12-28 DIAGNOSIS — O99340 Other mental disorders complicating pregnancy, unspecified trimester: Secondary | ICD-10-CM

## 2012-12-28 DIAGNOSIS — O2390 Unspecified genitourinary tract infection in pregnancy, unspecified trimester: Secondary | ICD-10-CM

## 2012-12-28 DIAGNOSIS — O2341 Unspecified infection of urinary tract in pregnancy, first trimester: Secondary | ICD-10-CM

## 2012-12-28 LAB — POCT URINALYSIS DIP (DEVICE)
Bilirubin Urine: NEGATIVE
Glucose, UA: NEGATIVE mg/dL
Hgb urine dipstick: NEGATIVE
Specific Gravity, Urine: 1.02 (ref 1.005–1.030)
Urobilinogen, UA: 1 mg/dL (ref 0.0–1.0)
pH: 7 (ref 5.0–8.0)

## 2012-12-28 MED ORDER — VITAMIN B-6 50 MG PO TABS: 50.0000 mg | ORAL_TABLET | Freq: Every day | ORAL | Status: DC

## 2012-12-28 NOTE — Patient Instructions (Signed)
Pregnancy - First Trimester  During sexual intercourse, millions of sperm go into the vagina. Only 1 sperm will penetrate and fertilize the female egg while it is in the Fallopian tube. One week later, the fertilized egg implants into the wall of the uterus. An embryo begins to develop into a baby. At 6 to 8 weeks, the eyes and face are formed and the heartbeat can be seen on ultrasound. At the end of 12 weeks (first trimester), all the baby's organs are formed. Now that you are pregnant, you will want to do everything you can to have a healthy baby. Two of the most important things are to get good prenatal care and follow your caregiver's instructions. Prenatal care is all the medical care you receive before the baby's birth. It is given to prevent, find, and treat problems during the pregnancy and childbirth.  PRENATAL EXAMS  · During prenatal visits, your weight, blood pressure, and urine are checked. This is done to make sure you are healthy and progressing normally during the pregnancy.  · A pregnant woman should gain 25 to 35 pounds during the pregnancy. However, if you are overweight or underweight, your caregiver will advise you regarding your weight.  · Your caregiver will ask and answer questions for you.  · Blood work, cervical cultures, other necessary tests, and a Pap test are done during your prenatal exams. These tests are done to check on your health and the probable health of your baby. Tests are strongly recommended and done for HIV with your permission. This is the virus that causes AIDS. These tests are done because medicines can be given to help prevent your baby from being born with this infection should you have been infected without knowing it. Blood work is also used to find out your blood type, previous infections, and follow your blood levels (hemoglobin).  · Low hemoglobin (anemia) is common during pregnancy. Iron and vitamins are given to help prevent this. Later in the pregnancy, blood  tests for diabetes will be done along with any other tests if any problems develop.  · You may need other tests to make sure you and the baby are doing well.  CHANGES DURING THE FIRST TRIMESTER   Your body goes through many changes during pregnancy. They vary from person to person. Talk to your caregiver about changes you notice and are concerned about. Changes can include:  · Your menstrual period stops.  · The egg and sperm carry the genes that determine what you look like. Genes from you and your partner are forming a baby. The female genes determine whether the baby is a boy or a girl.  · Your body increases in girth and you may feel bloated.  · Feeling sick to your stomach (nauseous) and throwing up (vomiting). If the vomiting is uncontrollable, call your caregiver.  · Your breasts will begin to enlarge and become tender.  · Your nipples may stick out more and become darker.  · The need to urinate more. Painful urination may mean you have a bladder infection.  · Tiring easily.  · Loss of appetite.  · Cravings for certain kinds of food.  · At first, you may gain or lose a couple of pounds.  · You may have changes in your emotions from day to day (excited to be pregnant or concerned something may go wrong with the pregnancy and baby).  · You may have more vivid and strange dreams.  HOME CARE INSTRUCTIONS   ·   It is very important to avoid all smoking, alcohol and non-prescribed drugs during your pregnancy. These affect the formation and growth of the baby. Avoid chemicals while pregnant to ensure the delivery of a healthy infant.  · Start your prenatal visits by the 12th week of pregnancy. They are usually scheduled monthly at first, then more often in the last 2 months before delivery. Keep your caregiver's appointments. Follow your caregiver's instructions regarding medicine use, blood and lab tests, exercise, and diet.  · During pregnancy, you are providing food for you and your baby. Eat regular, well-balanced  meals. Choose foods such as meat, fish, milk and other low fat dairy products, vegetables, fruits, and whole-grain breads and cereals. Your caregiver will tell you of the ideal weight gain.  · You can help morning sickness by keeping soda crackers at the bedside. Eat a couple before arising in the morning. You may want to use the crackers without salt on them.  · Eating 4 to 5 small meals rather than 3 large meals a day also may help the nausea and vomiting.  · Drinking liquids between meals instead of during meals also seems to help nausea and vomiting.  · A physical sexual relationship may be continued throughout pregnancy if there are no other problems. Problems may be early (premature) leaking of amniotic fluid from the membranes, vaginal bleeding, or belly (abdominal) pain.  · Exercise regularly if there are no restrictions. Check with your caregiver or physical therapist if you are unsure of the safety of some of your exercises. Greater weight gain will occur in the last 2 trimesters of pregnancy. Exercising will help:  · Control your weight.  · Keep you in shape.  · Prepare you for labor and delivery.  · Help you lose your pregnancy weight after you deliver your baby.  · Wear a good support or jogging bra for breast tenderness during pregnancy. This may help if worn during sleep too.  · Ask when prenatal classes are available. Begin classes when they are offered.  · Do not use hot tubs, steam rooms, or saunas.  · Wear your seat belt when driving. This protects you and your baby if you are in an accident.  · Avoid raw meat, uncooked cheese, cat litter boxes, and soil used by cats throughout the pregnancy. These carry germs that can cause birth defects in the baby.  · The first trimester is a good time to visit your dentist for your dental health. Getting your teeth cleaned is okay. Use a softer toothbrush and brush gently during pregnancy.  · Ask for help if you have financial, counseling, or nutritional needs  during pregnancy. Your caregiver will be able to offer counseling for these needs as well as refer you for other special needs.  · Do not take any medicines or herbs unless told by your caregiver.  · Inform your caregiver if there is any mental or physical domestic violence.  · Make a list of emergency phone numbers of family, friends, hospital, and police and fire departments.  · Write down your questions. Take them to your prenatal visit.  · Do not douche.  · Do not cross your legs.  · If you have to stand for long periods of time, rotate you feet or take small steps in a circle.  · You may have more vaginal secretions that may require a sanitary pad. Do not use tampons or scented sanitary pads.  MEDICINES AND DRUG USE IN PREGNANCY  ·   Take prenatal vitamins as directed. The vitamin should contain 1 milligram of folic acid. Keep all vitamins out of reach of children. Only a couple vitamins or tablets containing iron may be fatal to a baby or young child when ingested.  · Avoid use of all medicines, including herbs, over-the-counter medicines, not prescribed or suggested by your caregiver. Only take over-the-counter or prescription medicines for pain, discomfort, or fever as directed by your caregiver. Do not use aspirin, ibuprofen, or naproxen unless directed by your caregiver.  · Let your caregiver also know about herbs you may be using.  · Alcohol is related to a number of birth defects. This includes fetal alcohol syndrome. All alcohol, in any form, should be avoided completely. Smoking will cause low birth rate and premature babies.  · Street or illegal drugs are very harmful to the baby. They are absolutely forbidden. A baby born to an addicted mother will be addicted at birth. The baby will go through the same withdrawal an adult does.  · Let your caregiver know about any medicines that you have to take and for what reason you take them.  SEEK MEDICAL CARE IF:   You have any concerns or worries during your  pregnancy. It is better to call with your questions if you feel they cannot wait, rather than worry about them.  SEEK IMMEDIATE MEDICAL CARE IF:   · An unexplained oral temperature above 102° F (38.9° C) develops, or as your caregiver suggests.  · You have leaking of fluid from the vagina (birth canal). If leaking membranes are suspected, take your temperature and inform your caregiver of this when you call.  · There is vaginal spotting or bleeding. Notify your caregiver of the amount and how many pads are used.  · You develop a bad smelling vaginal discharge with a change in the color.  · You continue to feel sick to your stomach (nauseated) and have no relief from remedies suggested. You vomit blood or coffee ground-like materials.  · You lose more than 2 pounds of weight in 1 week.  · You gain more than 2 pounds of weight in 1 week and you notice swelling of your face, hands, feet, or legs.  · You gain 5 pounds or more in 1 week (even if you do not have swelling of your hands, face, legs, or feet).  · You get exposed to German measles and have never had them.  · You are exposed to fifth disease or chickenpox.  · You develop belly (abdominal) pain. Round ligament discomfort is a common non-cancerous (benign) cause of abdominal pain in pregnancy. Your caregiver still must evaluate this.  · You develop headache, fever, diarrhea, pain with urination, or shortness of breath.  · You fall or are in a car accident or have any kind of trauma.  · There is mental or physical violence in your home.  Document Released: 01/19/2001 Document Revised: 10/20/2011 Document Reviewed: 07/23/2008  [employer]® Patient Information ©2014 [employer].

## 2012-12-28 NOTE — Progress Notes (Signed)
P= 92  Pt still feels like she has UTI, was on antibiotics but continues to feel symptoms.

## 2012-12-28 NOTE — Progress Notes (Signed)
On Holter monitor for 30 days. Scheduled in [facility] next week, asks about having anatomy scan at 18 weeks before 2015 due to copay. Other [place] likely will be recommended. Vitamin B6 50 mg BID for nausea

## 2012-12-29 LAB — PRESCRIPTION MONITORING PROFILE (19 PANEL)
Amphetamine/Meth: NEGATIVE ng/mL
Benzodiazepine Screen, Urine: NEGATIVE ng/mL
Carisoprodol, Urine: NEGATIVE ng/mL
MDMA URINE: NEGATIVE ng/mL
Meperidine, Ur: NEGATIVE ng/mL
Methadone Screen, Urine: NEGATIVE ng/mL
Nitrites, Initial: NEGATIVE ug/mL
Propoxyphene: NEGATIVE ng/mL
Tramadol Scrn, Ur: NEGATIVE ng/mL
pH, Initial: 7.4 pH (ref 4.5–8.9)

## 2012-12-29 LAB — ALCOHOL METABOLITE (ETG), URINE: Ethyl Glucuronide (EtG): NEGATIVE ng/mL

## 2012-12-31 LAB — CULTURE, OB URINE: Colony Count: >=100000

## 2013-01-02 ENCOUNTER — Ambulatory Visit (HOSPITAL_COMMUNITY)
Admission: RE | Admit: 2013-01-02 | Discharge: 2013-01-02 | Disposition: A | Discharge status: Home / Self Care | Payer: Managed Care, Other (non HMO) | Source: Ambulatory Visit | Attending: Family Medicine | Admitting: Family Medicine

## 2013-01-02 ENCOUNTER — Ambulatory Visit (HOSPITAL_COMMUNITY): Admission: RE | Admit: 2013-01-02 | Payer: Managed Care, Other (non HMO) | Source: Ambulatory Visit

## 2013-01-02 VITALS — BP 117/63 | HR 92 | Wt 365.8 lb

## 2013-01-02 DIAGNOSIS — R000 Tachycardia, unspecified: Secondary | ICD-10-CM

## 2013-01-02 DIAGNOSIS — Z8269 Family history of other diseases of the musculoskeletal system and connective tissue: Secondary | ICD-10-CM | POA: Insufficient documentation

## 2013-01-02 DIAGNOSIS — O34219 Maternal care for unspecified type scar from previous cesarean delivery: Secondary | ICD-10-CM

## 2013-01-02 DIAGNOSIS — O0991 Supervision of high risk pregnancy, unspecified, first trimester: Secondary | ICD-10-CM

## 2013-01-02 DIAGNOSIS — Z8249 Family history of ischemic heart disease and other diseases of the circulatory system: Secondary | ICD-10-CM | POA: Insufficient documentation

## 2013-01-02 DIAGNOSIS — O99891 Other specified diseases and conditions complicating pregnancy: Secondary | ICD-10-CM | POA: Insufficient documentation

## 2013-01-02 DIAGNOSIS — I2510 Atherosclerotic heart disease of native coronary artery without angina pectoris: Secondary | ICD-10-CM | POA: Insufficient documentation

## 2013-01-02 DIAGNOSIS — I498 Other specified cardiac arrhythmias: Secondary | ICD-10-CM | POA: Insufficient documentation

## 2013-01-02 DIAGNOSIS — IMO0002 Reserved for concepts with insufficient information to code with codable children: Secondary | ICD-10-CM | POA: Insufficient documentation

## 2013-01-02 DIAGNOSIS — Z3682 Encounter for antenatal screening for nuchal translucency: Secondary | ICD-10-CM

## 2013-01-03 NOTE — Progress Notes (Signed)
Genetic Counseling  High-Risk Gestation Note  Appointment Date:  01/02/2013 Referred By: Reva Bores, MD Date of Birth:  [birth year] Partner:  Adele Schilder   Pregnancy History: [id number] Estimated Date of Delivery: 07/12/13 Estimated Gestational Age: [redacted]w[redacted]d Attending: Particia Nearing, MD   I met with Ms. Kathleen Andrews and her partner, Mr. Joey Hudock, for genetic counseling because of a previous son for the patient with congenital heart disease.  We began by reviewing the family history in detail. Ms. Sharene Butters reported that her first child, a son with a different partner, was born with coarctation of the aorta and a ventricular septal defect. He had corrective surgery through [facility]. He is currently 27 years old and otherwise healthy. Congenital heart defects occur in approximately 1% of pregnancies.  Congenital heart defects may occur due to multifactorial influences, chromosomal abnormalities, genetic syndromes or environmental exposures.  Isolated heart defects are generally multifactorial.  We discussed that given the report, this individual's heart defect appears to be isolated, in which case, multifactorial inheritance would be most likely suspected. In the case of a second degree relative with an apparently isolated congenital heart defect and assuming multifactorial inheritance, recurrence risk for the current pregnancy would be approximately 1-2%. We discussed the option of screening via prenatal ultrasounds via detailed ultrasound in the second trimester and fetal echocardiogram, to assess fetal heart in detail. In addition, we discussed the option of nuchal translucency assessment given that increased NT measurement can be associated with an increased chance for underlying congenital heart disease. The couple understands that ultrasound cannot diagnose or rule out all birth defects or genetic conditions.   Additionally, the patient reported that her mother was born with multiple  muscle, bone, and nerve issues. She has had multiple surgeries, including joint replacements. She reportedly used a wheelchair or walker until age 27 years old. The patient reported that the specific etiology for her features is not known but that her mother also has a diagnosis of multiple sclerosis. MS is generally thought to be a multifactorial condition, meaning that multiple genes and environmental factors are involved in its onset. The chance for recurrence for first degree relatives of an affected individual is estimated to be approximately 2-3% from birth and approximately 1% after age 71 years. We discussed that recurrence risk would be low for the patient and even lower for her offspring given the degree of relation and assuming multifactorial inheritance. Additional information is needed regarding the etiology of her mother's other features in order to more accurately assess recurrence risk for relatives and additional screening or testing that may be available. The patient also reported that her mother has deafness in one ear, with onset at approximately age 29 years old. The etiology is not known. Hearing loss can have many causes including genetic factors, environmental factors or a combination of both.  Sometimes hearing loss can occur as one feature of an underlying genetic condition or may be caused by a single nonworking gene. Additional information regarding a cause for their hearing loss is needed in order to most accurately assess the chance for relatives.  Ms. Sharene Butters also reported two maternal first cousins with Down syndrome. She had very limited information regarding these individuals given that her mother was adopted and has limited contact with her biological family. We discussed that 95% of cases of Down syndrome are not inherited and are the result of non-disjunction.  Three to 4% of cases of Down syndrome are the result of  a translocation involving chromosome #21.  We discussed the  option of chromosome analysis to determine if an individual is a carrier of a balanced translocation involving chromosome #21.  If an individual carries a balanced translocation involving chromosome #21, then the chance to have a baby with Down syndrome would be greater than the maternal age-related risk.  Additional information regarding these relative's karyotypes or Ms. Andrews's karyotype would assist with recurrence risk assessment. We reviewed that given the patient's age alone, the pregnancy is not considered to be at increased risk for fetal aneuploidy.   The father of the pregnancy reported a female maternal first cousin (his maternal aunt's daughter) with intellectual disability. She reportedly lives at home with her parents. He reported that she has some slight differences to her physical appearances but did not describe these differences. An underlying cause is not known for her delays. The couple was counseled that there are many different causes of intellectual disabilities including environmental, multifactorial, and genetic etiologies.  We discussed that a specific diagnosis for intellectual disability can be determined in approximately 50% of these individuals.  In the remaining 50% of individuals, a diagnosis may never be determined.  Regarding genetic causes, we discussed that chromosome aberrations (aneuploidy, deletions, duplications, insertions, and translocations) are responsible for a small percentage of individuals with intellectual disability.  Many individuals with chromosome aberrations have additional differences, including congenital anomalies or minor dysmorphisms.  Likewise, single gene conditions are the underlying cause of intellectual delay in some families.  We discussed that many gene conditions have intellectual disability as a feature, but also often include other physical or medical differences.  Additionally, this relative has the option of this family having an evaluation by  a medical geneticist to help determine the cause of the familial intellectual disability, if not previously performed.  We discussed that without more specific information, it is difficult to provide an accurate risk assessment.  Further genetic counseling is warranted if more information is obtained. The family histories were otherwise found to be noncontributory for birth defects, intellectual disability, and known genetic conditions.   We reviewed available screening options including First screen, Quad screen and ultrasound.  They understand that screening tests are used to modify a patient's a priori risk for aneuploidy, typically based on age.  This estimate provides a pregnancy specific risk assessment.  After reviewing these options, Ms. Brylinn Teaney Andrews elected to have ultrasound for nuchal translucency assessment only, but declined first trimester screening and Quad screening.  Ultrasound was performed today. Complete ultrasound results reported separately.  She understands that ultrasound cannot rule out all birth defects or genetic syndromes. Targeted ultrasound was scheduled for 02/06/13, and fetal echocardiogram was scheduled with [facility] for 01/26/13.   Ms. Sharene Butters was provided with written information regarding sickle cell anemia (SCA) including the carrier frequency and incidence in the African-American population, the availability of carrier testing and prenatal diagnosis if indicated. Mr. Ridgeway reported that he has sickle cell trait (Hemoglobin S). She reported that hemoglobin electrophoresis was previously performed and was normal, indicating that she does not have sickle cell trait.  Thus, we reviewed that the current pregnancy would not be expected to at risk for sickle cell disease, but would have a 1 in 2 (50%) chance for sickle cell trait. In addition, we discussed that hemoglobinopathies are routinely screened for as part of the [place] newborn screening panel.      Ms. Lynze Reddy Andrews denied exposure to environmental toxins  or chemical agents. She denied the use of alcohol, tobacco or street drugs. She denied significant viral illnesses during the course of her pregnancy. Her medical and surgical histories were contributory for recent heart arrhythmia, for which she is currently being evaluated.   I counseled this couple regarding the above risks and available options.  The approximate face-to-face time with the genetic counselor was 35 minutes.  Quinn Plowman, MS Certified Genetic Counselor 01/03/2013

## 2013-01-05 ENCOUNTER — Ambulatory Visit (HOSPITAL_COMMUNITY): Payer: Managed Care, Other (non HMO) | Attending: Cardiovascular Disease | Admitting: Cardiology

## 2013-01-05 DIAGNOSIS — R0609 Other forms of dyspnea: Secondary | ICD-10-CM | POA: Insufficient documentation

## 2013-01-05 DIAGNOSIS — R0989 Other specified symptoms and signs involving the circulatory and respiratory systems: Secondary | ICD-10-CM | POA: Insufficient documentation

## 2013-01-05 DIAGNOSIS — R000 Tachycardia, unspecified: Secondary | ICD-10-CM | POA: Insufficient documentation

## 2013-01-05 DIAGNOSIS — R0600 Dyspnea, unspecified: Secondary | ICD-10-CM

## 2013-01-05 DIAGNOSIS — R55 Syncope and collapse: Secondary | ICD-10-CM | POA: Insufficient documentation

## 2013-01-05 NOTE — Progress Notes (Signed)
Echo performed. 

## 2013-01-16 MED ORDER — NITROFURANTOIN MONOHYD MACRO 100 MG PO CAPS: 100.0000 mg | ORAL_CAPSULE | Freq: Two times a day (BID) | ORAL | Status: DC

## 2013-01-16 NOTE — Progress Notes (Signed)
Called patient per Dr. Debroah Loop and notified of UTI and need for macrobid which I have sent to pharmacy of her choice.   Kathleen Andrews voices understanding.

## 2013-01-16 NOTE — Addendum Note (Signed)
Addended by: Gerome Apley on: 01/16/2013 08:33 AM   Modules accepted: Orders

## 2013-01-17 ENCOUNTER — Telehealth: Payer: Self-pay | Admitting: *Deleted

## 2013-01-17 DIAGNOSIS — O2342 Unspecified infection of urinary tract in pregnancy, second trimester: Secondary | ICD-10-CM

## 2013-01-17 MED ORDER — CEPHALEXIN 500 MG PO CAPS: 500.0000 mg | ORAL_CAPSULE | Freq: Four times a day (QID) | ORAL | Status: DC

## 2013-01-17 NOTE — Telephone Encounter (Signed)
Patient called and stated that she was prescribed Macrobid yesterday but is not comfortable taking it. I had Dr. Reola Calkins look at her chart and she stated we can treat her with Keflex 500mg  four times daily for 7 days. Patient informed and she stated that she agrees with plan. Rx to pharmacy.

## 2013-01-25 ENCOUNTER — Encounter: Payer: Managed Care, Other (non HMO) | Admitting: Obstetrics & Gynecology

## 2013-01-29 ENCOUNTER — Encounter: Payer: Self-pay | Admitting: Obstetrics and Gynecology

## 2013-01-30 ENCOUNTER — Telehealth: Payer: Self-pay | Admitting: *Deleted

## 2013-01-30 DIAGNOSIS — B379 Candidiasis, unspecified: Secondary | ICD-10-CM

## 2013-01-30 MED ORDER — FLUCONAZOLE 150 MG PO TABS: 150.0000 mg | ORAL_TABLET | Freq: Every day | ORAL | Status: DC

## 2013-01-30 NOTE — Telephone Encounter (Signed)
Pt requesting rx for yeast.  

## 2013-01-31 ENCOUNTER — Other Ambulatory Visit: Payer: Self-pay | Admitting: Obstetrics and Gynecology

## 2013-01-31 DIAGNOSIS — Z0489 Encounter for examination and observation for other specified reasons: Secondary | ICD-10-CM

## 2013-02-05 ENCOUNTER — Encounter: Payer: Self-pay | Admitting: Cardiovascular Disease

## 2013-02-05 ENCOUNTER — Ambulatory Visit (INDEPENDENT_AMBULATORY_CARE_PROVIDER_SITE_OTHER): Payer: Managed Care, Other (non HMO) | Admitting: Cardiovascular Disease

## 2013-02-05 VITALS — BP 120/82 | HR 88 | Ht 66.0 in | Wt 367.0 lb

## 2013-02-05 DIAGNOSIS — R0609 Other forms of dyspnea: Secondary | ICD-10-CM

## 2013-02-05 DIAGNOSIS — R55 Syncope and collapse: Secondary | ICD-10-CM

## 2013-02-05 DIAGNOSIS — R000 Tachycardia, unspecified: Secondary | ICD-10-CM

## 2013-02-05 DIAGNOSIS — R0600 Dyspnea, unspecified: Secondary | ICD-10-CM

## 2013-02-05 NOTE — Progress Notes (Signed)
Patient ID: Kathleen Andrews, female   DOB: 12/10/1985, 27 y.o.   MRN: [id number] 27 yo obese black female referred for tachycaria , dyspnea and dizzyness. She is in her 3rd trimester  now. Has two living children 3 and 40 with older one having coarctation and VSD. Previous pregnancies no DM, pre-eclampsia and all by C section. Has had symptoms for about 2 months. HR can race out of blue suddenly Makes her feel dizzy. She indicates that this happened while at family practice. Reviewed ECG done at time and NSR with no arrhythmia Denies drugs, or excess caffeine Labs ok not anemic but no thyroid. No frank syncope. Also with marked exertional dyspnea. Normally able to walk but now very dyspnic. Mild chronic LE edema. No change  Reviewed OB notes and labs from 10/9 and 11/10 Frequent UTI;s with WBC;s and Rx with Keflex for 4 days. No dysuria now   Echo :  01/05/13 normal just mild MR  ETT with no ishcemia and HR starting at 85  Normal response to exercise  Monitor only SR ST no arrhythmia    Labs checked and TSH / T4 normal d dimer negative   She "passed out" Friday In talking to her she may have had a mechanical fall down her stairs Hurt her left foot but better now No palpitations, chest pain or dyspnea  No headache or seizure activity Was immediately lucid when found by family minutes After fall    ROS: Denies fever, malais, weight loss, blurry vision, decreased visual acuity, cough, sputum, SOB, hemoptysis, pleuritic pain, palpitaitons, heartburn, abdominal pain, melena, lower extremity edema, claudication, or rash.  All other systems reviewed and negative  General: Affect appropriate Obese black female  HEENT: normal Neck supple with no adenopathy JVP normal no bruits no thyromegaly Lungs clear with no wheezing and good diaphragmatic motion Heart:  S1/S2 no murmur, no rub, gallop or click PMI normal Abdomen: benighn, BS positve, no tenderness, no AAA no bruit.  No HSM or HJR Distal pulses  intact with no bruits No edema Neuro non-focal Skin warm and dry No muscular weakness   No current outpatient prescriptions on file.   No current facility-administered medications for this visit.    Allergies  Tomato; Eggs or egg-derived products; and Latex  Electrocardiogram: 10/9  SR rate 101 otherwise normal   Assessment and Plan

## 2013-02-05 NOTE — Assessment & Plan Note (Signed)
Functional from obesity and volume overload of pregnancy  No wheezing D dimer negative  Echo with normal RV and LV function

## 2013-02-05 NOTE — Assessment & Plan Note (Signed)
At this point seems functional due to weight and pregnancy  TSH normal  Echo with normal EF and monitor with no arrhythmia

## 2013-02-05 NOTE — Assessment & Plan Note (Signed)
Etiology not clear.  Doubt cardiac event with no evidence of structural heart disease  F/U OB  Getting [place] of baby tomorrow

## 2013-02-05 NOTE — Patient Instructions (Addendum)
Your physician recommends that you schedule a follow-up appointment in: [facility]   2015   WITH  DR [place] Your physician recommends that you continue on your current medications as directed. Please refer to the Current Medication list given to you today.

## 2013-02-06 ENCOUNTER — Other Ambulatory Visit: Payer: Self-pay | Admitting: Obstetrics and Gynecology

## 2013-02-06 ENCOUNTER — Ambulatory Visit (HOSPITAL_COMMUNITY)
Admission: RE | Admit: 2013-02-06 | Discharge: 2013-02-06 | Disposition: A | Discharge status: Home / Self Care | Payer: Managed Care, Other (non HMO) | Source: Ambulatory Visit | Attending: Obstetrics and Gynecology | Admitting: Obstetrics and Gynecology

## 2013-02-06 DIAGNOSIS — E669 Obesity, unspecified: Secondary | ICD-10-CM | POA: Insufficient documentation

## 2013-02-06 DIAGNOSIS — Z0489 Encounter for examination and observation for other specified reasons: Secondary | ICD-10-CM

## 2013-02-06 DIAGNOSIS — O352XX0 Maternal care for (suspected) hereditary disease in fetus, not applicable or unspecified: Secondary | ICD-10-CM | POA: Insufficient documentation

## 2013-02-06 DIAGNOSIS — O34219 Maternal care for unspecified type scar from previous cesarean delivery: Secondary | ICD-10-CM | POA: Insufficient documentation

## 2013-02-09 ENCOUNTER — Encounter: Payer: Self-pay | Admitting: Obstetrics and Gynecology

## 2013-02-09 DIAGNOSIS — O4400 Placenta previa specified as without hemorrhage, unspecified trimester: Secondary | ICD-10-CM | POA: Insufficient documentation

## 2013-02-27 ENCOUNTER — Ambulatory Visit (INDEPENDENT_AMBULATORY_CARE_PROVIDER_SITE_OTHER): Payer: Managed Care, Other (non HMO) | Admitting: Obstetrics and Gynecology

## 2013-02-27 ENCOUNTER — Encounter: Payer: Self-pay | Admitting: Obstetrics and Gynecology

## 2013-02-27 VITALS — BP 115/71 | Temp 97.3°F | Wt 365.1 lb

## 2013-02-27 DIAGNOSIS — O09299 Supervision of pregnancy with other poor reproductive or obstetric history, unspecified trimester: Secondary | ICD-10-CM

## 2013-02-27 DIAGNOSIS — O0990 Supervision of high risk pregnancy, unspecified, unspecified trimester: Secondary | ICD-10-CM

## 2013-02-27 DIAGNOSIS — O34219 Maternal care for unspecified type scar from previous cesarean delivery: Secondary | ICD-10-CM

## 2013-02-27 DIAGNOSIS — O99210 Obesity complicating pregnancy, unspecified trimester: Secondary | ICD-10-CM

## 2013-02-27 DIAGNOSIS — O4410 Placenta previa with hemorrhage, unspecified trimester: Secondary | ICD-10-CM

## 2013-02-27 DIAGNOSIS — O358XX0 Maternal care for other (suspected) fetal abnormality and damage, not applicable or unspecified: Secondary | ICD-10-CM

## 2013-02-27 DIAGNOSIS — E669 Obesity, unspecified: Secondary | ICD-10-CM

## 2013-02-27 DIAGNOSIS — O35BXX0 Maternal care for other (suspected) fetal abnormality and damage, fetal cardiac anomalies, not applicable or unspecified: Secondary | ICD-10-CM

## 2013-02-27 DIAGNOSIS — O4400 Placenta previa specified as without hemorrhage, unspecified trimester: Secondary | ICD-10-CM

## 2013-02-27 DIAGNOSIS — O0991 Supervision of high risk pregnancy, unspecified, first trimester: Secondary | ICD-10-CM

## 2013-02-27 NOTE — Progress Notes (Signed)
Patient is doing well today without any complaints. Patient is aware of follow up ultrasound scheduled on 03/20/2013. Patient has not been seen in over 8 weeks and reports that she has had a bad interaction with Dr. Roselie Awkward and front task. She is refusing all testing from this clinic including urinalysis.

## 2013-02-27 NOTE — Progress Notes (Signed)
P=97,   C/o increasing pelvic pain/back pain/ cramping.Refuses urinalysis today.

## 2013-03-15 ENCOUNTER — Other Ambulatory Visit: Payer: Self-pay | Admitting: Obstetrics and Gynecology

## 2013-03-15 DIAGNOSIS — E669 Obesity, unspecified: Secondary | ICD-10-CM

## 2013-03-15 DIAGNOSIS — O99210 Obesity complicating pregnancy, unspecified trimester: Secondary | ICD-10-CM

## 2013-03-15 DIAGNOSIS — O352XX0 Maternal care for (suspected) hereditary disease in fetus, not applicable or unspecified: Secondary | ICD-10-CM

## 2013-03-15 DIAGNOSIS — O34219 Maternal care for unspecified type scar from previous cesarean delivery: Secondary | ICD-10-CM

## 2013-03-19 ENCOUNTER — Inpatient Hospital Stay (HOSPITAL_COMMUNITY): Payer: Managed Care, Other (non HMO)

## 2013-03-19 ENCOUNTER — Inpatient Hospital Stay (HOSPITAL_COMMUNITY)
Admission: AD | Admit: 2013-03-19 | Discharge: 2013-03-19 | Disposition: A | Discharge status: Home / Self Care | Payer: Managed Care, Other (non HMO) | Source: Ambulatory Visit | Attending: Obstetrics and Gynecology | Admitting: Obstetrics and Gynecology

## 2013-03-19 ENCOUNTER — Encounter (HOSPITAL_COMMUNITY): Payer: Self-pay | Admitting: *Deleted

## 2013-03-19 DIAGNOSIS — O34219 Maternal care for unspecified type scar from previous cesarean delivery: Secondary | ICD-10-CM

## 2013-03-19 DIAGNOSIS — R059 Cough, unspecified: Secondary | ICD-10-CM | POA: Insufficient documentation

## 2013-03-19 DIAGNOSIS — O35BXX0 Maternal care for other (suspected) fetal abnormality and damage, fetal cardiac anomalies, not applicable or unspecified: Secondary | ICD-10-CM

## 2013-03-19 DIAGNOSIS — O2390 Unspecified genitourinary tract infection in pregnancy, unspecified trimester: Secondary | ICD-10-CM | POA: Insufficient documentation

## 2013-03-19 DIAGNOSIS — M545 Low back pain, unspecified: Secondary | ICD-10-CM | POA: Insufficient documentation

## 2013-03-19 DIAGNOSIS — R509 Fever, unspecified: Secondary | ICD-10-CM | POA: Insufficient documentation

## 2013-03-19 DIAGNOSIS — R109 Unspecified abdominal pain: Secondary | ICD-10-CM | POA: Insufficient documentation

## 2013-03-19 DIAGNOSIS — R300 Dysuria: Secondary | ICD-10-CM

## 2013-03-19 DIAGNOSIS — O99210 Obesity complicating pregnancy, unspecified trimester: Secondary | ICD-10-CM

## 2013-03-19 DIAGNOSIS — O09299 Supervision of pregnancy with other poor reproductive or obstetric history, unspecified trimester: Secondary | ICD-10-CM

## 2013-03-19 DIAGNOSIS — O2620 Pregnancy care for patient with recurrent pregnancy loss, unspecified trimester: Secondary | ICD-10-CM

## 2013-03-19 DIAGNOSIS — O358XX0 Maternal care for other (suspected) fetal abnormality and damage, not applicable or unspecified: Secondary | ICD-10-CM

## 2013-03-19 DIAGNOSIS — R000 Tachycardia, unspecified: Secondary | ICD-10-CM

## 2013-03-19 DIAGNOSIS — O4400 Placenta previa specified as without hemorrhage, unspecified trimester: Secondary | ICD-10-CM

## 2013-03-19 DIAGNOSIS — R05 Cough: Secondary | ICD-10-CM | POA: Insufficient documentation

## 2013-03-19 DIAGNOSIS — N390 Urinary tract infection, site not specified: Secondary | ICD-10-CM | POA: Insufficient documentation

## 2013-03-19 DIAGNOSIS — O0991 Supervision of high risk pregnancy, unspecified, first trimester: Secondary | ICD-10-CM

## 2013-03-19 LAB — URINALYSIS, ROUTINE W REFLEX MICROSCOPIC
Bilirubin Urine: NEGATIVE
Glucose, UA: NEGATIVE mg/dL
Hgb urine dipstick: NEGATIVE
Ketones, ur: NEGATIVE mg/dL
LEUKOCYTES UA: NEGATIVE
Nitrite: NEGATIVE
Protein, ur: NEGATIVE mg/dL
SPECIFIC GRAVITY, URINE: 1.015 (ref 1.005–1.030)
UROBILINOGEN UA: 0.2 mg/dL (ref 0.0–1.0)
pH: 7 (ref 5.0–8.0)

## 2013-03-19 MED ORDER — CEPHALEXIN 500 MG PO CAPS: 500.0000 mg | ORAL_CAPSULE | Freq: Four times a day (QID) | ORAL | Status: DC

## 2013-03-19 NOTE — MAU Provider Note (Signed)
@MAUPATCONTACT @  Chief Complaint:  Fever, Chills and Abdominal Pain   Kathleen Andrews is  28 y.o. [id number] at [redacted]w[redacted]d presents complaining of Fever, Chills and Abdominal Pain .  Pt states last night she felt feverish + chills and light headedness. Temp was a 100.2 when taken at home. Also complains of lower abdominal cramping and left sided low back pain. +headache, cough (dry, started on Saturday), urinary frequency and urgency(8-9 times last night)  Denies any sore throat, nasal congestion, dysuria or suprapubic pain. Reports good fetal movt, denies contractions, loss of fluid,  Vaginal bleeding Pt believes she has a UTI as she exhibits similar symptoms when she has one.  Past Medical History significant for Recurrent UTI's and Kidney stone Daughter at home recently had a cough  Obstetrical/Gynecological History: OB History   Grav Para Term Preterm Abortions TAB SAB Ect Mult Living   10 3 3  0 6 1 5  0 0 2     Past Medical History: Past Medical History  Diagnosis Date  . Urinary tract bacterial infections   . OBESITY, NOS   . Urge urinary incontinence   . Recurrent UTI   . Axillary adenopathy   . Amenorrhea due to oral contraceptive   .  Supervision of high-risk pregnancy in first trimester   . Tachycardia   . Previous cesarean delivery, antepartum condition or complication   . Congenital heart disease, fetal, affecting care of mother, antepartum-in previous child   . Obesity complicating peripregnancy, antepartum   . History of IUGR (intrauterine growth retardation) and stillbirth, currently pregnant   . Pregnancy complicated by previous recurrent miscarriages     Past Surgical History: Past Surgical History  Procedure Laterality Date  . Cesarean section      X 2 - failure to progress and repeat electie  . Ankle surgery      Family History: Family History  Problem Relation Age of Onset  . Cancer Other   . Hypertension Other   . Hyperlipidemia Other   . Blindness Other    . Deafness Other   . Diabetes Father   . Heart disease Other     mother adopted but reported strong history    Social History: History  Substance Use Topics  . Smoking status: Never Smoker   . Smokeless tobacco: Never Used  . Alcohol Use: No    Allergies:  Allergies  Allergen Reactions  . Tomato     Hives  . Eggs Or Egg-Derived Products Rash  . Latex Rash    Meds:  No prescriptions prior to admission    Review of Systems -   Review of Systems  Constitutional: Positive for fever, chills HENT: Negative for hearing loss, ear pain, nosebleeds, congestion, sore throat, neck pain, tinnitus and ear discharge.   Eyes: Negative for blurred vision, double vision, photophobia, pain, discharge and redness.  Respiratory: Positive for cough, Negative for hemoptysis, sputum production, shortness of breath, wheezing and stridor.   Cardiovascular: Negative for chest pain, palpitations, orthopnea,  leg swelling  Gastrointestinal: Positive for abdominal pain . Negative for heartburn, nausea, vomiting, diarrhea, constipation, blood in stool Genitourinary: Positive for urgency, frequency. Negative for dysuria,  hematuria and flank pain.  Musculoskeletal: Positive for myalgias whole body, back pain. Negative for joint pain and falls.    Physical Exam  Blood pressure 120/80, pulse 121, temperature 98.5 F (36.9 C), temperature source Oral, resp. rate 20, height 5\' 4"  (1.626 m), weight 168.284 kg (371 lb), last menstrual period 09/25/2012, SpO2  100.00%. GENERAL: Well-developed, well-nourished female in no acute distress.  LUNGS: Clear to auscultation bilaterally.  HEART: Regular rate and rhythm. ABDOMEN: Soft, nontender, nondistended, gravid.  +tenderness in left lower back FHT:  Baseline rate 155 bpm   Reassuring tracing Contractions: Not detected on monitor   Labs: No results found for this or any previous visit (from the past 24 hour(s)). Imaging Studies:  No results  found.  Assessment: Kathleen Andrews is  28 y.o. [id number] at [redacted]w[redacted]d presents with Fever, Chills, Abdominal Pain and dry cough Patient declined Flu testing Urine analysis came back normal. Pt insisted on Urine culture(states that she previously had a normal U.A with a positive culture, lab records confirm this) Cervix could not be checked due to p.previa detected on last U/S  Plan: Ultrasound  Urine Culture Keflex(Cefalexin) for UTI pending culture results Patient declined Tamiflu Will offer Cough suppressant and Analgesia for Pain relief  Cherlyn Roberts 2/9/201512:32 PM   Seen and examined by me also.   Patient insists the low back pain is indicative of UTI, despite negative UA. States had e. Coli in November with neg. UA  >>. Chart review confirms totally negative UA with + culture  US done today to check cervical length, since pt has a previa.  Unable to check cervix  Also [place] to r/o kidney stones.   Results for orders placed during the hospital encounter of 03/19/13 (from the past 24 hour(s))  URINALYSIS, ROUTINE W REFLEX MICROSCOPIC     Status: None   Collection Time    03/19/13 12:10 PM      Result Value Range   Color, Urine YELLOW  YELLOW   APPearance CLEAR  CLEAR   Specific Gravity, Urine 1.015  1.005 - 1.030   pH 7.0  5.0 - 8.0   Glucose, UA NEGATIVE  NEGATIVE mg/dL   Hgb urine dipstick NEGATIVE  NEGATIVE   Bilirubin Urine NEGATIVE  NEGATIVE   Ketones, ur NEGATIVE  NEGATIVE mg/dL   Protein, ur NEGATIVE  NEGATIVE mg/dL   Urobilinogen, UA 0.2  0.0 - 1.0 mg/dL   Nitrite NEGATIVE  NEGATIVE   Leukocytes, UA NEGATIVE  NEGATIVE    Renal US is negative for stones. Bilateral Jets seen Verbal report from tech shows Placenta has moved, no longer has previa Growth normal Cervix 4cm long.  Does not want to wait for written report.   Discussed findings.  Urine sent to culture per patient's insistence Will Rx Keflex with warnings about resistance  Per pt's insistence and  recent history of neg UA with + culture  Has appt for [place] in MFM tomorrow, so [place] tech notified them and I sent note to Dr Lisbeth Renshaw  Has appt next week in clinic, pt states "if you can call it that, They never do anything for me.  They never even told me I had a previa for 21 days.  They refused to treat my UTI and did not call me for 21 days after the culture came back."  States has spoken to [place], but has not talked to physicians. Encouraged to voice concerns with Dr Elly Modena next week if she has them.   Agrees.  Encouraged to return if not feeling better or if she decides to use Tamiflu or get tested.  Has been afebrile here.

## 2013-03-19 NOTE — Progress Notes (Signed)
MFM ultrasound  Indication: 28 yr old [id number] at [redacted]w[redacted]d with history of fetal demise and previous child with congenital heart defect for follow up ultrasound to complete anatomy. Previous finding of placenta previa. Remote read.  Findings: 1. Single intrauterine pregnancy. 2. Estimated fetal weight is in the 68th%. 3. Posterior placenta without evidence of previa. 4. Normal amniotic fluid volume. 5. Normal transvaginal cervical length. 6. The views of the aortic arch and spine remain limited. 7. The remainder of the limited anatomy survey is normal. Any anatomy not evaluated on today's exam was evaluated on the previous exam.  Recommendations: 1. Appropriate fetal growth. 2. Limited anatomy survey: - recommend reevaluate on follow up ultrasounds 3. Previous child with congenital heart defect: - previously counseled - had normal fetal echocardiogram; to follow up with Pediatric Cardiology after delivery 4. Previous fetal demise: - per the chart history is unclear - would recommend obtain full records - recommend fetal growth every 4 weeks - consider antenatal testing at 32 weeks; dependent on records 5. Placenta previa is resolved  [place], MD

## 2013-03-19 NOTE — MAU Note (Addendum)
Patient presents to MAU with c/o fever, 100.2 taken at home, chills, and lower abdominal cramping x 3days. Also states having non-productive cough. Denies discharge, VB, LOF, or Contractions at this time. Reports good fetal movement.

## 2013-03-19 NOTE — MAU Provider Note (Signed)
Attestation of Attending Supervision of Advanced Practitioner (CNM/NP): Evaluation and management procedures were performed by the Advanced Practitioner under my supervision and collaboration.  I have reviewed the Advanced Practitioner's note and chart, and I agree with the management and plan.  Nuchem Grattan 03/19/2013 5:56 PM

## 2013-03-19 NOTE — Discharge Instructions (Signed)
Dysuria Dysuria is the medical term for pain with urination. There are many causes for dysuria, but urinary tract infection is the most common. If a urinalysis was performed it can show that there is a urinary tract infection. A urine culture confirms that you or your child is sick. You will need to follow up with a healthcare provider because:  If a urine culture was done you will need to know the culture results and treatment recommendations.  If the urine culture was positive, you or your child will need to be put on antibiotics or know if the antibiotics prescribed are the right antibiotics for your urinary tract infection.  If the urine culture is negative (no urinary tract infection), then other causes may need to be explored or antibiotics need to be stopped. Today laboratory work may have been done and there does not seem to be an infection. If cultures were done they will take at least 24 to 48 hours to be completed. Today x-rays may have been taken and they read as normal. No cause can be found for the problems. The x-rays may be re-read by a radiologist and you will be contacted if additional findings are made. You or your child may have been put on medications to help with this problem until you can see your primary caregiver. If the problems get better, see your primary caregiver if the problems return. If you were given antibiotics (medications which kill germs), take all of the mediations as directed for the full course of treatment.  If laboratory work was done, you need to find the results. Leave a telephone number where you can be reached. If this is not possible, make sure you find out how you are to get test results. HOME CARE INSTRUCTIONS   Drink lots of fluids. For adults, drink eight, 8 ounce glasses of clear juice or water a day. For children, replace fluids as suggested by your caregiver.  Empty the bladder often. Avoid holding urine for long periods of time.  After a bowel  movement, women should cleanse front to back, using each tissue only once.  Empty your bladder before and after sexual intercourse.  Take all the medicine given to you until it is gone. You may feel better in a few days, but TAKE ALL MEDICINE.  Avoid caffeine, tea, alcohol and carbonated beverages, because they tend to irritate the bladder.  In men, alcohol may irritate the prostate.  Only take over-the-counter or prescription medicines for pain, discomfort, or fever as directed by your caregiver.  If your caregiver has given you a follow-up appointment, it is very important to keep that appointment. Not keeping the appointment could result in a chronic or permanent injury, pain, and disability. If there is any problem keeping the appointment, you must call back to this facility for assistance. SEEK IMMEDIATE MEDICAL CARE IF:   Back pain develops.  A fever develops.  There is nausea (feeling sick to your stomach) or vomiting (throwing up).  Problems are no better with medications or are getting worse. MAKE SURE YOU:   Understand these instructions.  Will watch your condition.  Will get help right away if you are not doing well or get worse. Document Released: 10/24/2003 Document Revised: 04/19/2011 Document Reviewed: 08/31/2007 [facility] Patient Information 2014 [place]. Urinary Tract Infection Urinary tract infections (UTIs) can develop anywhere along your urinary tract. Your urinary tract is your body's drainage system for removing wastes and extra water. Your urinary tract includes two  kidneys, two ureters, a bladder, and a urethra. Your kidneys are a pair of bean-shaped organs. Each kidney is about the size of your fist. They are located below your ribs, one on each side of your spine. CAUSES Infections are caused by microbes, which are microscopic organisms, including fungi, viruses, and bacteria. These organisms are so small that they can only be seen through a  microscope. Bacteria are the microbes that most commonly cause UTIs. SYMPTOMS  Symptoms of UTIs may vary by age and gender of the patient and by the location of the infection. Symptoms in young women typically include a frequent and intense urge to urinate and a painful, burning feeling in the bladder or urethra during urination. Older women and men are more likely to be tired, shaky, and weak and have muscle aches and abdominal pain. A fever may mean the infection is in your kidneys. Other symptoms of a kidney infection include pain in your back or sides below the ribs, nausea, and vomiting. DIAGNOSIS To diagnose a UTI, your caregiver will ask you about your symptoms. Your caregiver also will ask to provide a urine sample. The urine sample will be tested for bacteria and white blood cells. White blood cells are made by your body to help fight infection. TREATMENT  Typically, UTIs can be treated with medication. Because most UTIs are caused by a bacterial infection, they usually can be treated with the use of antibiotics. The choice of antibiotic and length of treatment depend on your symptoms and the type of bacteria causing your infection. HOME CARE INSTRUCTIONS  If you were prescribed antibiotics, take them exactly as your caregiver instructs you. Finish the medication even if you feel better after you have only taken some of the medication.  Drink enough water and fluids to keep your urine clear or pale yellow.  Avoid caffeine, tea, and carbonated beverages. They tend to irritate your bladder.  Empty your bladder often. Avoid holding urine for long periods of time.  Empty your bladder before and after sexual intercourse.  After a bowel movement, women should cleanse from front to back. Use each tissue only once. SEEK MEDICAL CARE IF:   You have back pain.  You develop a fever.  Your symptoms do not begin to resolve within 3 days. SEEK IMMEDIATE MEDICAL CARE IF:   You have severe back  pain or lower abdominal pain.  You develop chills.  You have nausea or vomiting.  You have continued burning or discomfort with urination. MAKE SURE YOU:   Understand these instructions.  Will watch your condition.  Will get help right away if you are not doing well or get worse. Document Released: 11/04/2004 Document Revised: 07/27/2011 Document Reviewed: 03/05/2011 [facility] Patient Information 2014 [place].

## 2013-03-20 ENCOUNTER — Ambulatory Visit (HOSPITAL_COMMUNITY): Admission: RE | Admit: 2013-03-20 | Payer: Managed Care, Other (non HMO) | Source: Ambulatory Visit

## 2013-03-20 LAB — URINE CULTURE: Colony Count: 50000

## 2013-03-23 ENCOUNTER — Encounter: Payer: Self-pay | Admitting: Advanced Practice Midwife

## 2013-03-27 ENCOUNTER — Encounter: Payer: Managed Care, Other (non HMO) | Admitting: Obstetrics and Gynecology

## 2013-04-03 ENCOUNTER — Encounter: Payer: Self-pay | Admitting: Obstetrics & Gynecology

## 2013-04-03 ENCOUNTER — Ambulatory Visit (INDEPENDENT_AMBULATORY_CARE_PROVIDER_SITE_OTHER): Payer: Managed Care, Other (non HMO) | Admitting: Family Medicine

## 2013-04-03 VITALS — BP 126/89 | Wt 373.2 lb

## 2013-04-03 DIAGNOSIS — O09299 Supervision of pregnancy with other poor reproductive or obstetric history, unspecified trimester: Secondary | ICD-10-CM

## 2013-04-03 DIAGNOSIS — O2620 Pregnancy care for patient with recurrent pregnancy loss, unspecified trimester: Secondary | ICD-10-CM

## 2013-04-03 DIAGNOSIS — O0990 Supervision of high risk pregnancy, unspecified, unspecified trimester: Secondary | ICD-10-CM

## 2013-04-03 DIAGNOSIS — O4400 Placenta previa specified as without hemorrhage, unspecified trimester: Secondary | ICD-10-CM

## 2013-04-03 DIAGNOSIS — O4410 Placenta previa with hemorrhage, unspecified trimester: Secondary | ICD-10-CM

## 2013-04-03 DIAGNOSIS — O0991 Supervision of high risk pregnancy, unspecified, first trimester: Secondary | ICD-10-CM

## 2013-04-03 NOTE — Progress Notes (Signed)
Pulse-  95 

## 2013-04-03 NOTE — Patient Instructions (Signed)
Second Trimester of Pregnancy The second trimester is from week 13 through week 28, months 4 through 6. The second trimester is often a time when you feel your best. Your body has also adjusted to being pregnant, and you begin to feel better physically. Usually, morning sickness has lessened or quit completely, you may have more energy, and you may have an increase in appetite. The second trimester is also a time when the fetus is growing rapidly. At the end of the sixth month, the fetus is about 9 inches long and weighs about 1 pounds. You will likely begin to feel the baby move (quickening) between 18 and 20 weeks of the pregnancy. BODY CHANGES Your body goes through many changes during pregnancy. The changes vary from woman to woman.   Your weight will continue to increase. You will notice your lower abdomen bulging out.  You may begin to get stretch marks on your hips, abdomen, and breasts.  You may develop headaches that can be relieved by medicines approved by your caregiver.  You may urinate more often because the fetus is pressing on your bladder.  You may develop or continue to have heartburn as a result of your pregnancy.  You may develop constipation because certain hormones are causing the muscles that push waste through your intestines to slow down.  You may develop hemorrhoids or swollen, bulging veins (varicose veins).  You may have back pain because of the weight gain and pregnancy hormones relaxing your joints between the bones in your pelvis and as a result of a shift in weight and the muscles that support your balance.  Your breasts will continue to grow and be tender.  Your gums may bleed and may be sensitive to brushing and flossing.  Dark spots or blotches (chloasma, mask of pregnancy) may develop on your face. This will likely fade after the baby is born.  A dark line from your belly button to the pubic area (linea nigra) may appear. This will likely fade after the  baby is born. WHAT TO EXPECT AT YOUR PRENATAL VISITS During a routine prenatal visit:  You will be weighed to make sure you and the fetus are growing normally.  Your blood pressure will be taken.  Your abdomen will be measured to track your baby's growth.  The fetal heartbeat will be listened to.  Any test results from the previous visit will be discussed. Your caregiver may ask you:  How you are feeling.  If you are feeling the baby move.  If you have had any abnormal symptoms, such as leaking fluid, bleeding, severe headaches, or abdominal cramping.  If you have any questions. Other tests that may be performed during your second trimester include:  Blood tests that check for:  Low iron levels (anemia).  Gestational diabetes (between 24 and 28 weeks).  Rh antibodies.  Urine tests to check for infections, diabetes, or protein in the urine.  An ultrasound to confirm the proper growth and development of the baby.  An amniocentesis to check for possible genetic problems.  Fetal screens for spina bifida and Down syndrome. HOME CARE INSTRUCTIONS   Avoid all smoking, herbs, alcohol, and unprescribed drugs. These chemicals affect the formation and growth of the baby.  Follow your caregiver's instructions regarding medicine use. There are medicines that are either safe or unsafe to take during pregnancy.  Exercise only as directed by your caregiver. Experiencing uterine cramps is a good sign to stop exercising.  Continue to eat regular,   healthy meals.  Wear a good support bra for breast tenderness.  Do not use hot tubs, steam rooms, or saunas.  Wear your seat belt at all times when driving.  Avoid raw meat, uncooked cheese, cat litter boxes, and soil used by cats. These carry germs that can cause birth defects in the baby.  Take your prenatal vitamins.  Try taking a stool softener (if your caregiver approves) if you develop constipation. Eat more high-fiber foods,  such as fresh vegetables or fruit and whole grains. Drink plenty of fluids to keep your urine clear or pale yellow.  Take warm sitz baths to soothe any pain or discomfort caused by hemorrhoids. Use hemorrhoid cream if your caregiver approves.  If you develop varicose veins, wear support hose. Elevate your feet for 15 minutes, 3 4 times a day. Limit salt in your diet.  Avoid heavy lifting, wear low heel shoes, and practice good posture.  Rest with your legs elevated if you have leg cramps or low back pain.  Visit your dentist if you have not gone yet during your pregnancy. Use a soft toothbrush to brush your teeth and be gentle when you floss.  A sexual relationship may be continued unless your caregiver directs you otherwise.  Continue to go to all your prenatal visits as directed by your caregiver. SEEK MEDICAL CARE IF:   You have dizziness.  You have mild pelvic cramps, pelvic pressure, or nagging pain in the abdominal area.  You have persistent nausea, vomiting, or diarrhea.  You have a bad smelling vaginal discharge.  You have pain with urination. SEEK IMMEDIATE MEDICAL CARE IF:   You have a fever.  You are leaking fluid from your vagina.  You have spotting or bleeding from your vagina.  You have severe abdominal cramping or pain.  You have rapid weight gain or loss.  You have shortness of breath with chest pain.  You notice sudden or extreme swelling of your face, hands, ankles, feet, or legs.  You have not felt your baby move in over an hour.  You have severe headaches that do not go away with medicine.  You have vision changes. Document Released: 01/19/2001 Document Revised: 09/27/2012 Document Reviewed: 03/28/2012 [employer] Patient Information 2014 [employer].  

## 2013-04-03 NOTE — Progress Notes (Signed)
28 yo [id number] @ [redacted]w[redacted]d here for ROBV 1) concern of due date - states "refuses to accept the ultrasound due date" - says baby was conceived prior to this date and she has had complications from carrying late so refuses to accept - reviewed all records. LMP off by 10 days from [redacted]w[redacted]d [place]  2) paperwork for jobs - has FMLA paperwork that needs to be filled out.  - no ctx, lof, vb. +FM.   O: see flowsheet  A/P - reviewed dating again in system. [redacted]w[redacted]d [place] with documented IUP off by 10 days from LMP. Discussed dating criteria and need to stick with the [place] as dating - Paperwork discussed and given to [employer] - fundal heights not accurate due to maternal obesity - PTL precautions discussed - f/u in 3 weeks for 28 week visit. Pt refuses glucola and 28 week labs. Also refused to leave a urine today

## 2013-04-09 ENCOUNTER — Encounter: Payer: Self-pay | Admitting: *Deleted

## 2013-04-18 ENCOUNTER — Encounter (HOSPITAL_COMMUNITY): Payer: Self-pay

## 2013-04-18 ENCOUNTER — Ambulatory Visit (HOSPITAL_COMMUNITY)
Admission: RE | Admit: 2013-04-18 | Discharge: 2013-04-18 | Disposition: A | Discharge status: Home / Self Care | Payer: Managed Care, Other (non HMO) | Source: Ambulatory Visit | Attending: Obstetrics and Gynecology | Admitting: Obstetrics and Gynecology

## 2013-04-18 VITALS — BP 132/87 | HR 92 | Wt 374.0 lb

## 2013-04-18 DIAGNOSIS — O35BXX0 Maternal care for other (suspected) fetal abnormality and damage, fetal cardiac anomalies, not applicable or unspecified: Secondary | ICD-10-CM

## 2013-04-18 DIAGNOSIS — E669 Obesity, unspecified: Secondary | ICD-10-CM

## 2013-04-18 DIAGNOSIS — O352XX0 Maternal care for (suspected) hereditary disease in fetus, not applicable or unspecified: Secondary | ICD-10-CM

## 2013-04-18 DIAGNOSIS — Z3689 Encounter for other specified antenatal screening: Secondary | ICD-10-CM | POA: Insufficient documentation

## 2013-04-18 DIAGNOSIS — O358XX0 Maternal care for other (suspected) fetal abnormality and damage, not applicable or unspecified: Secondary | ICD-10-CM

## 2013-04-18 DIAGNOSIS — O34219 Maternal care for unspecified type scar from previous cesarean delivery: Secondary | ICD-10-CM | POA: Insufficient documentation

## 2013-04-18 DIAGNOSIS — O2620 Pregnancy care for patient with recurrent pregnancy loss, unspecified trimester: Secondary | ICD-10-CM

## 2013-04-18 DIAGNOSIS — O99210 Obesity complicating pregnancy, unspecified trimester: Secondary | ICD-10-CM

## 2013-04-18 DIAGNOSIS — O09299 Supervision of pregnancy with other poor reproductive or obstetric history, unspecified trimester: Secondary | ICD-10-CM

## 2013-04-24 ENCOUNTER — Encounter: Payer: Managed Care, Other (non HMO) | Admitting: Obstetrics & Gynecology

## 2013-04-25 ENCOUNTER — Encounter: Payer: Self-pay | Admitting: Family Medicine

## 2013-04-25 ENCOUNTER — Ambulatory Visit (INDEPENDENT_AMBULATORY_CARE_PROVIDER_SITE_OTHER): Payer: Managed Care, Other (non HMO) | Admitting: Family Medicine

## 2013-04-25 VITALS — BP 134/85 | Wt 373.3 lb

## 2013-04-25 DIAGNOSIS — O0990 Supervision of high risk pregnancy, unspecified, unspecified trimester: Secondary | ICD-10-CM

## 2013-04-25 DIAGNOSIS — O4410 Placenta previa with hemorrhage, unspecified trimester: Secondary | ICD-10-CM

## 2013-04-25 DIAGNOSIS — E669 Obesity, unspecified: Secondary | ICD-10-CM

## 2013-04-25 DIAGNOSIS — O09299 Supervision of pregnancy with other poor reproductive or obstetric history, unspecified trimester: Secondary | ICD-10-CM

## 2013-04-25 DIAGNOSIS — O0991 Supervision of high risk pregnancy, unspecified, first trimester: Secondary | ICD-10-CM

## 2013-04-25 DIAGNOSIS — O34219 Maternal care for unspecified type scar from previous cesarean delivery: Secondary | ICD-10-CM

## 2013-04-25 DIAGNOSIS — O99210 Obesity complicating pregnancy, unspecified trimester: Secondary | ICD-10-CM

## 2013-04-25 DIAGNOSIS — O4400 Placenta previa specified as without hemorrhage, unspecified trimester: Secondary | ICD-10-CM

## 2013-04-25 NOTE — Progress Notes (Signed)
Pulse: 82 Pt refuses to do the 28 week labs.

## 2013-04-25 NOTE — Progress Notes (Signed)
S: 27 Kathleen Andrews here for ROBV - excessive swelling in legs- today is better. But had one day that was very severe.  - refusing  To leave urine or get blood work  (including 28 week labs) - says she has dipped her urine at home and it was trace protein - checks her blood sugar and states her fasting 70s-90s and pp are 127s.   O: see flowsheet  A/P - swelling- discussed likely due to maternal obesity and pregnancy.  Without labs, no way to investigate more.  - ? Elevated BS- refusing glucola.  Discussed diet modifications. Already getting serial [place] due to [facility]. Baby 83rd%ile on last scan - PTL precautions discussed - will schedule c/s with BTL for 39 wks.  Message sent to [place]

## 2013-05-01 ENCOUNTER — Ambulatory Visit: Payer: Managed Care, Other (non HMO) | Admitting: Cardiovascular Disease

## 2013-05-16 ENCOUNTER — Other Ambulatory Visit (HOSPITAL_COMMUNITY): Payer: Self-pay | Admitting: Maternal and Fetal Medicine

## 2013-05-16 ENCOUNTER — Telehealth: Payer: Self-pay

## 2013-05-16 ENCOUNTER — Ambulatory Visit (HOSPITAL_COMMUNITY)
Admission: RE | Admit: 2013-05-16 | Discharge: 2013-05-16 | Disposition: A | Discharge status: Home / Self Care | Payer: Managed Care, Other (non HMO) | Source: Ambulatory Visit | Attending: Obstetrics and Gynecology | Admitting: Obstetrics and Gynecology

## 2013-05-16 ENCOUNTER — Encounter: Payer: Managed Care, Other (non HMO) | Admitting: Advanced Practice Midwife

## 2013-05-16 ENCOUNTER — Encounter: Payer: Self-pay | Admitting: Family Medicine

## 2013-05-16 ENCOUNTER — Ambulatory Visit (INDEPENDENT_AMBULATORY_CARE_PROVIDER_SITE_OTHER): Payer: Managed Care, Other (non HMO) | Admitting: Family Medicine

## 2013-05-16 VITALS — BP 136/87 | Temp 98.1°F | Wt 374.3 lb

## 2013-05-16 VITALS — BP 138/68 | HR 81 | Wt 378.0 lb

## 2013-05-16 DIAGNOSIS — O09299 Supervision of pregnancy with other poor reproductive or obstetric history, unspecified trimester: Secondary | ICD-10-CM

## 2013-05-16 DIAGNOSIS — Z8279 Family history of other congenital malformations, deformations and chromosomal abnormalities: Secondary | ICD-10-CM

## 2013-05-16 DIAGNOSIS — O3663X0 Maternal care for excessive fetal growth, third trimester, not applicable or unspecified: Secondary | ICD-10-CM

## 2013-05-16 DIAGNOSIS — O4690 Antepartum hemorrhage, unspecified, unspecified trimester: Secondary | ICD-10-CM | POA: Insufficient documentation

## 2013-05-16 DIAGNOSIS — E669 Obesity, unspecified: Secondary | ICD-10-CM | POA: Insufficient documentation

## 2013-05-16 DIAGNOSIS — O0990 Supervision of high risk pregnancy, unspecified, unspecified trimester: Secondary | ICD-10-CM

## 2013-05-16 DIAGNOSIS — O2620 Pregnancy care for patient with recurrent pregnancy loss, unspecified trimester: Secondary | ICD-10-CM | POA: Insufficient documentation

## 2013-05-16 DIAGNOSIS — O358XX0 Maternal care for other (suspected) fetal abnormality and damage, not applicable or unspecified: Secondary | ICD-10-CM

## 2013-05-16 DIAGNOSIS — O0991 Supervision of high risk pregnancy, unspecified, first trimester: Secondary | ICD-10-CM

## 2013-05-16 DIAGNOSIS — O352XX0 Maternal care for (suspected) hereditary disease in fetus, not applicable or unspecified: Secondary | ICD-10-CM | POA: Insufficient documentation

## 2013-05-16 DIAGNOSIS — O35BXX0 Maternal care for other (suspected) fetal abnormality and damage, fetal cardiac anomalies, not applicable or unspecified: Secondary | ICD-10-CM

## 2013-05-16 DIAGNOSIS — O99210 Obesity complicating pregnancy, unspecified trimester: Secondary | ICD-10-CM

## 2013-05-16 DIAGNOSIS — O34219 Maternal care for unspecified type scar from previous cesarean delivery: Secondary | ICD-10-CM | POA: Insufficient documentation

## 2013-05-16 NOTE — Patient Instructions (Signed)
Third Trimester of Pregnancy The third trimester is from week 29 through week 42, months 7 through 9. The third trimester is a time when the fetus is growing rapidly. At the end of the ninth month, the fetus is about 20 inches in length and weighs 6 10 pounds.  BODY CHANGES Your body goes through many changes during pregnancy. The changes vary from woman to woman.   Your weight will continue to increase. You can expect to gain 25 35 pounds (11 16 kg) by the end of the pregnancy.  You may begin to get stretch marks on your hips, abdomen, and breasts.  You may urinate more often because the fetus is moving lower into your pelvis and pressing on your bladder.  You may develop or continue to have heartburn as a result of your pregnancy.  You may develop constipation because certain hormones are causing the muscles that push waste through your intestines to slow down.  You may develop hemorrhoids or swollen, bulging veins (varicose veins).  You may have pelvic pain because of the weight gain and pregnancy hormones relaxing your joints between the bones in your pelvis. Back aches may result from over exertion of the muscles supporting your posture.  Your breasts will continue to grow and be tender. A yellow discharge may leak from your breasts called colostrum.  Your belly button may stick out.  You may feel short of breath because of your expanding uterus.  You may notice the fetus "dropping," or moving lower in your abdomen.  You may have a bloody mucus discharge. This usually occurs a few days to a week before labor begins.  Your cervix becomes thin and soft (effaced) near your due date. WHAT TO EXPECT AT YOUR PRENATAL EXAMS  You will have prenatal exams every 2 weeks until week 36. Then, you will have weekly prenatal exams. During a routine prenatal visit:  You will be weighed to make sure you and the fetus are growing normally.  Your blood pressure is taken.  Your abdomen will be  measured to track your baby's growth.  The fetal heartbeat will be listened to.  Any test results from the previous visit will be discussed.  You may have a cervical check near your due date to see if you have effaced. At around 36 weeks, your caregiver will check your cervix. At the same time, your caregiver will also perform a test on the secretions of the vaginal tissue. This test is to determine if a type of bacteria, Group B streptococcus, is present. Your caregiver will explain this further. Your caregiver may ask you:  What your birth plan is.  How you are feeling.  If you are feeling the baby move.  If you have had any abnormal symptoms, such as leaking fluid, bleeding, severe headaches, or abdominal cramping.  If you have any questions. Other tests or screenings that may be performed during your third trimester include:  Blood tests that check for low iron levels (anemia).  Fetal testing to check the health, activity level, and growth of the fetus. Testing is done if you have certain medical conditions or if there are problems during the pregnancy. FALSE LABOR You may feel small, irregular contractions that eventually go away. These are called Braxton Hicks contractions, or false labor. Contractions may last for hours, days, or even weeks before true labor sets in. If contractions come at regular intervals, intensify, or become painful, it is best to be seen by your caregiver.  SIGNS OF LABOR   Menstrual-like cramps.  Contractions that are 5 minutes apart or less.  Contractions that start on the top of the uterus and spread down to the lower abdomen and back.  A sense of increased pelvic pressure or back pain.  A watery or bloody mucus discharge that comes from the vagina. If you have any of these signs before the 37th week of pregnancy, call your caregiver right away. You need to go to the hospital to get checked immediately. HOME CARE INSTRUCTIONS   Avoid all  smoking, herbs, alcohol, and unprescribed drugs. These chemicals affect the formation and growth of the baby.  Follow your caregiver's instructions regarding medicine use. There are medicines that are either safe or unsafe to take during pregnancy.  Exercise only as directed by your caregiver. Experiencing uterine cramps is a good sign to stop exercising.  Continue to eat regular, healthy meals.  Wear a good support bra for breast tenderness.  Do not use hot tubs, steam rooms, or saunas.  Wear your seat belt at all times when driving.  Avoid raw meat, uncooked cheese, cat litter boxes, and soil used by cats. These carry germs that can cause birth defects in the baby.  Take your prenatal vitamins.  Try taking a stool softener (if your caregiver approves) if you develop constipation. Eat more high-fiber foods, such as fresh vegetables or fruit and whole grains. Drink plenty of fluids to keep your urine clear or pale yellow.  Take warm sitz baths to soothe any pain or discomfort caused by hemorrhoids. Use hemorrhoid cream if your caregiver approves.  If you develop varicose veins, wear support hose. Elevate your feet for 15 minutes, 3 4 times a day. Limit salt in your diet.  Avoid heavy lifting, wear low heal shoes, and practice good posture.  Rest a lot with your legs elevated if you have leg cramps or low back pain.  Visit your dentist if you have not gone during your pregnancy. Use a soft toothbrush to brush your teeth and be gentle when you floss.  A sexual relationship may be continued unless your caregiver directs you otherwise.  Do not travel far distances unless it is absolutely necessary and only with the approval of your caregiver.  Take prenatal classes to understand, practice, and ask questions about the labor and delivery.  Make a trial run to the hospital.  Pack your hospital bag.  Prepare the baby's nursery.  Continue to go to all your prenatal visits as directed  by your caregiver. SEEK MEDICAL CARE IF:  You are unsure if you are in labor or if your water has broken.  You have dizziness.  You have mild pelvic cramps, pelvic pressure, or nagging pain in your abdominal area.  You have persistent nausea, vomiting, or diarrhea.  You have a bad smelling vaginal discharge.  You have pain with urination. SEEK IMMEDIATE MEDICAL CARE IF:   You have a fever.  You are leaking fluid from your vagina.  You have spotting or bleeding from your vagina.  You have severe abdominal cramping or pain.  You have rapid weight loss or gain.  You have shortness of breath with chest pain.  You notice sudden or extreme swelling of your face, hands, ankles, feet, or legs.  You have not felt your baby move in over an hour.  You have severe headaches that do not go away with medicine.  You have vision changes. Document Released: 01/19/2001 Document Revised: 09/27/2012 Document Reviewed:   You have severe abdominal cramping or pain.   You have rapid weight loss or gain.   You have shortness of breath with chest pain.   You notice sudden or extreme swelling of your face, hands, ankles, feet, or legs.   You have not felt your baby move in over an hour.   You have severe headaches that do not go away with medicine.   You have vision changes.  Document Released: 01/19/2001 Document Revised: 09/27/2012 Document Reviewed: 03/28/2012  [employer] Patient Information 2014 [employer].

## 2013-05-16 NOTE — Progress Notes (Signed)
Pulse: 93

## 2013-05-16 NOTE — Telephone Encounter (Signed)
Message copied by Michel Harrow on Wed May 16, 2013  4:26 PM ------      Message from: Kassie Mends      Created: Wed May 16, 2013  1:05 PM      Regarding: call pt       Hey ladies,              Will one of you be willing to call this pt and let her know the following:            1) I spoke to Dr. Lisbeth Renshaw over lunch and we reviewed all your dating and everything in the system.  He and I both agree that the due date has to be 07/10/13 based on the study.              2) as we discussed in clinic today, that means we can move up the c/section by 2 days so she will get a new time on 07/03/13.              3) we both discussed and he still believes that the twice weekly testing is a good idea so we will start that in 1 week as she already has the appointment.             4) I will see her in 2 weeks!  ------

## 2013-05-16 NOTE — Telephone Encounter (Signed)
Called pt and informed pt that Dr. Olevia Bowens spoke with "Dr. Lisbeth Renshaw over lunch and reviewed all her dating and everything in the system. Dr.Beck states that both agree that the due date has to be 07/10/13 based on the study; as the patient and Dr. Olevia Bowens discussed in clinic today, that means wecan move up the c/section by 2 days so she will get a new time on 07/03/13, which has been set already; "we both discussed and he still believes that the twice weekly testing is a good idea so we will start that in 1 week as she already had the appointment and that Dr. Olevia Bowens will see her in 2 weeks. Pt stated understanding with no further questions.

## 2013-05-16 NOTE — Progress Notes (Signed)
28 yo [id number] here for ROBV @ [redacted]w[redacted]d.   - still refusing all lab draws and urine checks  - states had spotting and regular contractions over the weekend. None since then. Didn't come in to the hospital because she "doesn't trust this place". Husband says he begged her to come but she wouldn't - states has had no contractions since.   - seen this AM for [place] and BPP by MFM and BPP was 8/8 and US showed baby that was large with all measurements >80th%ile and AC>93rd suggesting glucose intolerance. States she checks sugars at home and none have been higher than 140- but per the pt this is normal for her.  Says she doesn't believe she has diabetes and refuses testing.   - +FM - no recent ctx, lof, vb - significant vaginal pain.   - still disagrees with date of c-section and demanding we move up the date.  Believes she knows the date of conception and our [place] is "wrong".  States she will not come to the hospital if she starts contracting because she doesn't trust [place] and so we need to move up the date so that her risk of still born decreases.    O: see flowsheet  A/P  1) EDD - went personally and discussed with Dr. Lisbeth Renshaw in MFM. We reviewed dating and [place] results.   - the due date was moved up two days due to a discrepancy in how it was put in in [place] but she is still dated by a [redacted]w[redacted]d CRL which is 8 days off from her LMP.   - both MFM and myself agree this is fixed and her c/s was scheduled on 07/03/13 @[redacted]w[redacted]d .  - the pt was let know this information.   2) likely glucose intolerance - pt still refusing glucola - states she will continue to check sugars at home from time to time.    3) spotting/irregular ctx over the weekend - refuses cervical check today or further evaluation  4) overall dissatisfaction with care - discussed with pt that I am concerned because [facility] leading [facility] to a place where she could be putting herself or her unborn child at risk.   - I offered referral to [facility] in order to be seen there as she says she would trust them but the pt refuses saysing "I'm not going to drive to [place]". The father of the baby agrees that transfer would be a good idea and agrees to drive her but pt refuses.  - we discussed that she will need to come in for contractions or vaginal bleeding in the future as this could be a serious concern and pt stated "I agree that if I think it is serious enough, I will come" - again re-iterated my concern that should she not feel she can trust [place], it is unsafe for her to continue to receive her care here and my recommendation is for her to transfer care. She says she will "think about it"  4) hx of IUFD - will start twice weekly testing at 32 weeks   5) f/u with [place] in 2 weeks.

## 2013-05-23 ENCOUNTER — Other Ambulatory Visit: Payer: Self-pay | Admitting: Obstetrics and Gynecology

## 2013-05-23 ENCOUNTER — Ambulatory Visit (INDEPENDENT_AMBULATORY_CARE_PROVIDER_SITE_OTHER): Payer: Managed Care, Other (non HMO) | Admitting: Obstetrics and Gynecology

## 2013-05-23 VITALS — BP 130/70 | Wt 375.7 lb

## 2013-05-23 DIAGNOSIS — O4690 Antepartum hemorrhage, unspecified, unspecified trimester: Secondary | ICD-10-CM

## 2013-05-23 DIAGNOSIS — O09299 Supervision of pregnancy with other poor reproductive or obstetric history, unspecified trimester: Secondary | ICD-10-CM | POA: Insufficient documentation

## 2013-05-23 DIAGNOSIS — O2620 Pregnancy care for patient with recurrent pregnancy loss, unspecified trimester: Secondary | ICD-10-CM

## 2013-05-23 DIAGNOSIS — E669 Obesity, unspecified: Secondary | ICD-10-CM

## 2013-05-23 DIAGNOSIS — O99210 Obesity complicating pregnancy, unspecified trimester: Secondary | ICD-10-CM

## 2013-05-23 DIAGNOSIS — O352XX0 Maternal care for (suspected) hereditary disease in fetus, not applicable or unspecified: Secondary | ICD-10-CM

## 2013-05-23 DIAGNOSIS — O34219 Maternal care for unspecified type scar from previous cesarean delivery: Secondary | ICD-10-CM

## 2013-05-23 LAB — GLUCOSE, CAPILLARY: GLUCOSE-CAPILLARY: 89 mg/dL (ref 70–99)

## 2013-05-23 NOTE — Patient Instructions (Signed)
Third Trimester of Pregnancy The third trimester is from week 29 through week 42, months 7 through 9. The third trimester is a time when the fetus is growing rapidly. At the end of the ninth month, the fetus is about 20 inches in length and weighs 6 10 pounds.  BODY CHANGES Your body goes through many changes during pregnancy. The changes vary from woman to woman.   Your weight will continue to increase. You can expect to gain 25 35 pounds (11 16 kg) by the end of the pregnancy.  You may begin to get stretch marks on your hips, abdomen, and breasts.  You may urinate more often because the fetus is moving lower into your pelvis and pressing on your bladder.  You may develop or continue to have heartburn as a result of your pregnancy.  You may develop constipation because certain hormones are causing the muscles that push waste through your intestines to slow down.  You may develop hemorrhoids or swollen, bulging veins (varicose veins).  You may have pelvic pain because of the weight gain and pregnancy hormones relaxing your joints between the bones in your pelvis. Back aches may result from over exertion of the muscles supporting your posture.  Your breasts will continue to grow and be tender. A yellow discharge may leak from your breasts called colostrum.  Your belly button may stick out.  You may feel short of breath because of your expanding uterus.  You may notice the fetus "dropping," or moving lower in your abdomen.  You may have a bloody mucus discharge. This usually occurs a few days to a week before labor begins.  Your cervix becomes thin and soft (effaced) near your due date. WHAT TO EXPECT AT YOUR PRENATAL EXAMS  You will have prenatal exams every 2 weeks until week 36. Then, you will have weekly prenatal exams. During a routine prenatal visit:  You will be weighed to make sure you and the fetus are growing normally.  Your blood pressure is taken.  Your abdomen will be  measured to track your baby's growth.  The fetal heartbeat will be listened to.  Any test results from the previous visit will be discussed.  You may have a cervical check near your due date to see if you have effaced. At around 36 weeks, your caregiver will check your cervix. At the same time, your caregiver will also perform a test on the secretions of the vaginal tissue. This test is to determine if a type of bacteria, Group B streptococcus, is present. Your caregiver will explain this further. Your caregiver may ask you:  What your birth plan is.  How you are feeling.  If you are feeling the baby move.  If you have had any abnormal symptoms, such as leaking fluid, bleeding, severe headaches, or abdominal cramping.  If you have any questions. Other tests or screenings that may be performed during your third trimester include:  Blood tests that check for low iron levels (anemia).  Fetal testing to check the health, activity level, and growth of the fetus. Testing is done if you have certain medical conditions or if there are problems during the pregnancy. FALSE LABOR You may feel small, irregular contractions that eventually go away. These are called Braxton Hicks contractions, or false labor. Contractions may last for hours, days, or even weeks before true labor sets in. If contractions come at regular intervals, intensify, or become painful, it is best to be seen by your caregiver.  SIGNS OF LABOR   Menstrual-like cramps.  Contractions that are 5 minutes apart or less.  Contractions that start on the top of the uterus and spread down to the lower abdomen and back.  A sense of increased pelvic pressure or back pain.  A watery or bloody mucus discharge that comes from the vagina. If you have any of these signs before the 37th week of pregnancy, call your caregiver right away. You need to go to the hospital to get checked immediately. HOME CARE INSTRUCTIONS   Avoid all  smoking, herbs, alcohol, and unprescribed drugs. These chemicals affect the formation and growth of the baby.  Follow your caregiver's instructions regarding medicine use. There are medicines that are either safe or unsafe to take during pregnancy.  Exercise only as directed by your caregiver. Experiencing uterine cramps is a good sign to stop exercising.  Continue to eat regular, healthy meals.  Wear a good support bra for breast tenderness.  Do not use hot tubs, steam rooms, or saunas.  Wear your seat belt at all times when driving.  Avoid raw meat, uncooked cheese, cat litter boxes, and soil used by cats. These carry germs that can cause birth defects in the baby.  Take your prenatal vitamins.  Try taking a stool softener (if your caregiver approves) if you develop constipation. Eat more high-fiber foods, such as fresh vegetables or fruit and whole grains. Drink plenty of fluids to keep your urine clear or pale yellow.  Take warm sitz baths to soothe any pain or discomfort caused by hemorrhoids. Use hemorrhoid cream if your caregiver approves.  If you develop varicose veins, wear support hose. Elevate your feet for 15 minutes, 3 4 times a day. Limit salt in your diet.  Avoid heavy lifting, wear low heal shoes, and practice good posture.  Rest a lot with your legs elevated if you have leg cramps or low back pain.  Visit your dentist if you have not gone during your pregnancy. Use a soft toothbrush to brush your teeth and be gentle when you floss.  A sexual relationship may be continued unless your caregiver directs you otherwise.  Do not travel far distances unless it is absolutely necessary and only with the approval of your caregiver.  Take prenatal classes to understand, practice, and ask questions about the labor and delivery.  Make a trial run to the hospital.  Pack your hospital bag.  Prepare the baby's nursery.  Continue to go to all your prenatal visits as directed  by your caregiver. SEEK MEDICAL CARE IF:  You are unsure if you are in labor or if your water has broken.  You have dizziness.  You have mild pelvic cramps, pelvic pressure, or nagging pain in your abdominal area.  You have persistent nausea, vomiting, or diarrhea.  You have a bad smelling vaginal discharge.  You have pain with urination. SEEK IMMEDIATE MEDICAL CARE IF:   You have a fever.  You are leaking fluid from your vagina.  You have spotting or bleeding from your vagina.  You have severe abdominal cramping or pain.  You have rapid weight loss or gain.  You have shortness of breath with chest pain.  You notice sudden or extreme swelling of your face, hands, ankles, feet, or legs.  You have not felt your baby move in over an hour.  You have severe headaches that do not go away with medicine.  You have vision changes. Document Released: 01/19/2001 Document Revised: 09/27/2012 Document Reviewed:   You have severe abdominal cramping or pain.   You have rapid weight loss or gain.   You have shortness of breath with chest pain.   You notice sudden or extreme swelling of your face, hands, ankles, feet, or legs.   You have not felt your baby move in over an hour.   You have severe headaches that do not go away with medicine.   You have vision changes.  Document Released: 01/19/2001 Document Revised: 09/27/2012 Document Reviewed: 03/28/2012  [employer] Patient Information 2014 [employer].

## 2013-05-23 NOTE — Progress Notes (Signed)
ROI filled out again to request records from [facility]. While filling out, pt. States "this is the third time ive filled this out. They probably won't send them because I requested that they dont ever give out my information. It is not something I want to discuss with anyone. I want it behind me." Informed pt. That by signing the paper it gives them permission to sent [place] the records. Pt. Verbalized understanding. ROI to be faxed.

## 2013-05-23 NOTE — Progress Notes (Signed)
[place] 05/16/13: AFI 22, EFW 86th, AC 93rd. Has checked CBGs at home but not for last 2 wks: states highest fasting 96 and most 79-77, highest PP 127. 8 hrs fasting now: CBG 86. Advised do home checks and bring log book. Reviewed dating criteria again, seemed satisfied.  ROI [facility] re  intrapartum demise at 95 wks in 2010 per pt RLP discussed.

## 2013-05-23 NOTE — Progress Notes (Signed)
P = 84    Pt reports bright red spotting 4 days ago.  Pt did not submit urine specimen today- states "I don't do urines here."  Weekly AFI/NST @ MFM on Fridays scheduled

## 2013-05-25 ENCOUNTER — Ambulatory Visit (HOSPITAL_COMMUNITY)
Admission: RE | Admit: 2013-05-25 | Discharge: 2013-05-25 | Disposition: A | Discharge status: Home / Self Care | Payer: Managed Care, Other (non HMO) | Source: Ambulatory Visit | Attending: Sports Medicine | Admitting: Sports Medicine

## 2013-05-25 ENCOUNTER — Ambulatory Visit (HOSPITAL_COMMUNITY)
Admission: RE | Admit: 2013-05-25 | Discharge: 2013-05-25 | Disposition: A | Discharge status: Home / Self Care | Payer: Managed Care, Other (non HMO) | Source: Ambulatory Visit | Attending: Family Medicine | Admitting: Family Medicine

## 2013-05-25 ENCOUNTER — Other Ambulatory Visit: Payer: Managed Care, Other (non HMO)

## 2013-05-25 DIAGNOSIS — O99210 Obesity complicating pregnancy, unspecified trimester: Secondary | ICD-10-CM

## 2013-05-25 DIAGNOSIS — O09299 Supervision of pregnancy with other poor reproductive or obstetric history, unspecified trimester: Secondary | ICD-10-CM

## 2013-05-25 DIAGNOSIS — O34219 Maternal care for unspecified type scar from previous cesarean delivery: Secondary | ICD-10-CM

## 2013-05-25 DIAGNOSIS — E669 Obesity, unspecified: Secondary | ICD-10-CM | POA: Insufficient documentation

## 2013-05-25 DIAGNOSIS — O4690 Antepartum hemorrhage, unspecified, unspecified trimester: Secondary | ICD-10-CM

## 2013-05-25 DIAGNOSIS — O352XX0 Maternal care for (suspected) hereditary disease in fetus, not applicable or unspecified: Secondary | ICD-10-CM | POA: Insufficient documentation

## 2013-05-25 DIAGNOSIS — O2620 Pregnancy care for patient with recurrent pregnancy loss, unspecified trimester: Secondary | ICD-10-CM | POA: Insufficient documentation

## 2013-05-28 ENCOUNTER — Telehealth: Payer: Self-pay | Admitting: *Deleted

## 2013-05-28 NOTE — Telephone Encounter (Signed)
Pt called regarding future clinic appts. She stated that she prefers to have twice weekly NST's @ MFM only and has scheduled those appts. I advised pt that she will still need clinic appts for prenatal care. She was given appt for 4/22 @ 0830.  Pt agreed and voiced understanding of all information.

## 2013-05-29 ENCOUNTER — Other Ambulatory Visit (HOSPITAL_COMMUNITY): Payer: Self-pay | Admitting: Maternal and Fetal Medicine

## 2013-05-29 ENCOUNTER — Encounter (HOSPITAL_COMMUNITY): Payer: Self-pay

## 2013-05-29 ENCOUNTER — Ambulatory Visit (HOSPITAL_COMMUNITY)
Admission: RE | Admit: 2013-05-29 | Discharge: 2013-05-29 | Disposition: A | Discharge status: Home / Self Care | Payer: Managed Care, Other (non HMO) | Source: Ambulatory Visit | Attending: Obstetrics & Gynecology | Admitting: Obstetrics & Gynecology

## 2013-05-29 VITALS — BP 105/54 | HR 87 | Wt 382.8 lb

## 2013-05-29 DIAGNOSIS — O34219 Maternal care for unspecified type scar from previous cesarean delivery: Secondary | ICD-10-CM | POA: Insufficient documentation

## 2013-05-29 DIAGNOSIS — O99210 Obesity complicating pregnancy, unspecified trimester: Secondary | ICD-10-CM

## 2013-05-29 DIAGNOSIS — O4400 Placenta previa specified as without hemorrhage, unspecified trimester: Secondary | ICD-10-CM

## 2013-05-29 DIAGNOSIS — E669 Obesity, unspecified: Secondary | ICD-10-CM | POA: Insufficient documentation

## 2013-05-29 DIAGNOSIS — O3663X0 Maternal care for excessive fetal growth, third trimester, not applicable or unspecified: Secondary | ICD-10-CM

## 2013-05-29 DIAGNOSIS — O352XX0 Maternal care for (suspected) hereditary disease in fetus, not applicable or unspecified: Secondary | ICD-10-CM | POA: Insufficient documentation

## 2013-05-29 DIAGNOSIS — O2620 Pregnancy care for patient with recurrent pregnancy loss, unspecified trimester: Secondary | ICD-10-CM | POA: Insufficient documentation

## 2013-05-29 DIAGNOSIS — O09299 Supervision of pregnancy with other poor reproductive or obstetric history, unspecified trimester: Secondary | ICD-10-CM | POA: Insufficient documentation

## 2013-05-29 DIAGNOSIS — O358XX0 Maternal care for other (suspected) fetal abnormality and damage, not applicable or unspecified: Secondary | ICD-10-CM

## 2013-05-29 DIAGNOSIS — R000 Tachycardia, unspecified: Secondary | ICD-10-CM

## 2013-05-29 DIAGNOSIS — O0991 Supervision of high risk pregnancy, unspecified, first trimester: Secondary | ICD-10-CM

## 2013-05-29 DIAGNOSIS — O35BXX0 Maternal care for other (suspected) fetal abnormality and damage, fetal cardiac anomalies, not applicable or unspecified: Secondary | ICD-10-CM

## 2013-05-30 ENCOUNTER — Ambulatory Visit (INDEPENDENT_AMBULATORY_CARE_PROVIDER_SITE_OTHER): Payer: Managed Care, Other (non HMO) | Admitting: Family Medicine

## 2013-05-30 ENCOUNTER — Encounter: Payer: Self-pay | Admitting: Family Medicine

## 2013-05-30 ENCOUNTER — Other Ambulatory Visit: Payer: Managed Care, Other (non HMO)

## 2013-05-30 ENCOUNTER — Encounter: Payer: Managed Care, Other (non HMO) | Admitting: Family Medicine

## 2013-05-30 VITALS — BP 119/77 | HR 97 | Temp 97.8°F | Wt 378.3 lb

## 2013-05-30 DIAGNOSIS — O0991 Supervision of high risk pregnancy, unspecified, first trimester: Secondary | ICD-10-CM

## 2013-05-30 DIAGNOSIS — O4410 Placenta previa with hemorrhage, unspecified trimester: Secondary | ICD-10-CM

## 2013-05-30 DIAGNOSIS — O34219 Maternal care for unspecified type scar from previous cesarean delivery: Secondary | ICD-10-CM

## 2013-05-30 DIAGNOSIS — O358XX0 Maternal care for other (suspected) fetal abnormality and damage, not applicable or unspecified: Secondary | ICD-10-CM

## 2013-05-30 DIAGNOSIS — O35BXX0 Maternal care for other (suspected) fetal abnormality and damage, fetal cardiac anomalies, not applicable or unspecified: Secondary | ICD-10-CM

## 2013-05-30 DIAGNOSIS — O09299 Supervision of pregnancy with other poor reproductive or obstetric history, unspecified trimester: Secondary | ICD-10-CM

## 2013-05-30 DIAGNOSIS — O4400 Placenta previa specified as without hemorrhage, unspecified trimester: Secondary | ICD-10-CM

## 2013-05-30 DIAGNOSIS — O0990 Supervision of high risk pregnancy, unspecified, unspecified trimester: Secondary | ICD-10-CM

## 2013-05-30 MED ORDER — FLUCONAZOLE 150 MG PO TABS
150.0000 mg | ORAL_TABLET | Freq: Once | ORAL | Status: DC
Start: 2013-05-30 — End: 2013-06-19

## 2013-05-30 NOTE — Progress Notes (Signed)
Pressure-vaginal, pelvic

## 2013-05-30 NOTE — Progress Notes (Signed)
S: 28 yo [id number] @ [redacted]w[redacted]d here for robv  - has constant vaginal pressure - occasionally some bleeding but doesn't come for evaluation as is just slight on the tissue paper.  - no ctx, lof. +FM - still refusing lab work. Checks FSBG at bedtime. Postprandials <130  O: see flowsheet  A/P - cervix checked today due to bleeding and somewhat effaced. Closed. Bleeding likely due to that.  But on exam also appears visibly irritated with thick discharge. Will treat for yeast and see if symptoms improve that way.   - discussed PTL precautions - cont twice weekly testing for hx of IUFD - again discussed risks associated with not allowing [place] to check labs or blood sugars. - f/u in 2 weeks

## 2013-05-30 NOTE — Patient Instructions (Signed)
Third Trimester of Pregnancy The third trimester is from week 29 through week 42, months 7 through 9. The third trimester is a time when the fetus is growing rapidly. At the end of the ninth month, the fetus is about 20 inches in length and weighs 6 10 pounds.  BODY CHANGES Your body goes through many changes during pregnancy. The changes vary from woman to woman.   Your weight will continue to increase. You can expect to gain 25 35 pounds (11 16 kg) by the end of the pregnancy.  You may begin to get stretch marks on your hips, abdomen, and breasts.  You may urinate more often because the fetus is moving lower into your pelvis and pressing on your bladder.  You may develop or continue to have heartburn as a result of your pregnancy.  You may develop constipation because certain hormones are causing the muscles that push waste through your intestines to slow down.  You may develop hemorrhoids or swollen, bulging veins (varicose veins).  You may have pelvic pain because of the weight gain and pregnancy hormones relaxing your joints between the bones in your pelvis. Back aches may result from over exertion of the muscles supporting your posture.  Your breasts will continue to grow and be tender. A yellow discharge may leak from your breasts called colostrum.  Your belly button may stick out.  You may feel short of breath because of your expanding uterus.  You may notice the fetus "dropping," or moving lower in your abdomen.  You may have a bloody mucus discharge. This usually occurs a few days to a week before labor begins.  Your cervix becomes thin and soft (effaced) near your due date. WHAT TO EXPECT AT YOUR PRENATAL EXAMS  You will have prenatal exams every 2 weeks until week 36. Then, you will have weekly prenatal exams. During a routine prenatal visit:  You will be weighed to make sure you and the fetus are growing normally.  Your blood pressure is taken.  Your abdomen will be  measured to track your baby's growth.  The fetal heartbeat will be listened to.  Any test results from the previous visit will be discussed.  You may have a cervical check near your due date to see if you have effaced. At around 36 weeks, your caregiver will check your cervix. At the same time, your caregiver will also perform a test on the secretions of the vaginal tissue. This test is to determine if a type of bacteria, Group B streptococcus, is present. Your caregiver will explain this further. Your caregiver may ask you:  What your birth plan is.  How you are feeling.  If you are feeling the baby move.  If you have had any abnormal symptoms, such as leaking fluid, bleeding, severe headaches, or abdominal cramping.  If you have any questions. Other tests or screenings that may be performed during your third trimester include:  Blood tests that check for low iron levels (anemia).  Fetal testing to check the health, activity level, and growth of the fetus. Testing is done if you have certain medical conditions or if there are problems during the pregnancy. FALSE LABOR You may feel small, irregular contractions that eventually go away. These are called Braxton Hicks contractions, or false labor. Contractions may last for hours, days, or even weeks before true labor sets in. If contractions come at regular intervals, intensify, or become painful, it is best to be seen by your caregiver.  SIGNS OF LABOR   Menstrual-like cramps.  Contractions that are 5 minutes apart or less.  Contractions that start on the top of the uterus and spread down to the lower abdomen and back.  A sense of increased pelvic pressure or back pain.  A watery or bloody mucus discharge that comes from the vagina. If you have any of these signs before the 37th week of pregnancy, call your caregiver right away. You need to go to the hospital to get checked immediately. HOME CARE INSTRUCTIONS   Avoid all  smoking, herbs, alcohol, and unprescribed drugs. These chemicals affect the formation and growth of the baby.  Follow your caregiver's instructions regarding medicine use. There are medicines that are either safe or unsafe to take during pregnancy.  Exercise only as directed by your caregiver. Experiencing uterine cramps is a good sign to stop exercising.  Continue to eat regular, healthy meals.  Wear a good support bra for breast tenderness.  Do not use hot tubs, steam rooms, or saunas.  Wear your seat belt at all times when driving.  Avoid raw meat, uncooked cheese, cat litter boxes, and soil used by cats. These carry germs that can cause birth defects in the baby.  Take your prenatal vitamins.  Try taking a stool softener (if your caregiver approves) if you develop constipation. Eat more high-fiber foods, such as fresh vegetables or fruit and whole grains. Drink plenty of fluids to keep your urine clear or pale yellow.  Take warm sitz baths to soothe any pain or discomfort caused by hemorrhoids. Use hemorrhoid cream if your caregiver approves.  If you develop varicose veins, wear support hose. Elevate your feet for 15 minutes, 3 4 times a day. Limit salt in your diet.  Avoid heavy lifting, wear low heal shoes, and practice good posture.  Rest a lot with your legs elevated if you have leg cramps or low back pain.  Visit your dentist if you have not gone during your pregnancy. Use a soft toothbrush to brush your teeth and be gentle when you floss.  A sexual relationship may be continued unless your caregiver directs you otherwise.  Do not travel far distances unless it is absolutely necessary and only with the approval of your caregiver.  Take prenatal classes to understand, practice, and ask questions about the labor and delivery.  Make a trial run to the hospital.  Pack your hospital bag.  Prepare the baby's nursery.  Continue to go to all your prenatal visits as directed  by your caregiver. SEEK MEDICAL CARE IF:  You are unsure if you are in labor or if your water has broken.  You have dizziness.  You have mild pelvic cramps, pelvic pressure, or nagging pain in your abdominal area.  You have persistent nausea, vomiting, or diarrhea.  You have a bad smelling vaginal discharge.  You have pain with urination. SEEK IMMEDIATE MEDICAL CARE IF:   You have a fever.  You are leaking fluid from your vagina.  You have spotting or bleeding from your vagina.  You have severe abdominal cramping or pain.  You have rapid weight loss or gain.  You have shortness of breath with chest pain.  You notice sudden or extreme swelling of your face, hands, ankles, feet, or legs.  You have not felt your baby move in over an hour.  You have severe headaches that do not go away with medicine.  You have vision changes. Document Released: 01/19/2001 Document Revised: 09/27/2012 Document Reviewed:   You have severe abdominal cramping or pain.   You have rapid weight loss or gain.   You have shortness of breath with chest pain.   You notice sudden or extreme swelling of your face, hands, ankles, feet, or legs.   You have not felt your baby move in over an hour.   You have severe headaches that do not go away with medicine.   You have vision changes.  Document Released: 01/19/2001 Document Revised: 09/27/2012 Document Reviewed: 03/28/2012  [employer] Patient Information 2014 [employer].

## 2013-06-01 ENCOUNTER — Encounter (HOSPITAL_COMMUNITY): Payer: Self-pay

## 2013-06-01 ENCOUNTER — Ambulatory Visit (HOSPITAL_COMMUNITY)
Admission: RE | Admit: 2013-06-01 | Discharge: 2013-06-01 | Disposition: A | Discharge status: Home / Self Care | Payer: Managed Care, Other (non HMO) | Source: Ambulatory Visit | Attending: Sports Medicine | Admitting: Sports Medicine

## 2013-06-01 ENCOUNTER — Ambulatory Visit (HOSPITAL_COMMUNITY)
Admission: RE | Admit: 2013-06-01 | Discharge: 2013-06-01 | Disposition: A | Discharge status: Home / Self Care | Payer: Managed Care, Other (non HMO) | Source: Ambulatory Visit | Attending: Family Medicine | Admitting: Family Medicine

## 2013-06-01 DIAGNOSIS — Z3689 Encounter for other specified antenatal screening: Secondary | ICD-10-CM | POA: Insufficient documentation

## 2013-06-01 DIAGNOSIS — O0990 Supervision of high risk pregnancy, unspecified, unspecified trimester: Secondary | ICD-10-CM | POA: Insufficient documentation

## 2013-06-01 DIAGNOSIS — O3660X0 Maternal care for excessive fetal growth, unspecified trimester, not applicable or unspecified: Secondary | ICD-10-CM | POA: Insufficient documentation

## 2013-06-01 DIAGNOSIS — O3663X0 Maternal care for excessive fetal growth, third trimester, not applicable or unspecified: Secondary | ICD-10-CM

## 2013-06-01 DIAGNOSIS — O99210 Obesity complicating pregnancy, unspecified trimester: Secondary | ICD-10-CM

## 2013-06-01 DIAGNOSIS — E669 Obesity, unspecified: Secondary | ICD-10-CM | POA: Insufficient documentation

## 2013-06-05 ENCOUNTER — Encounter: Payer: Managed Care, Other (non HMO) | Admitting: Obstetrics & Gynecology

## 2013-06-05 ENCOUNTER — Ambulatory Visit (HOSPITAL_COMMUNITY)
Admission: RE | Admit: 2013-06-05 | Discharge: 2013-06-05 | Disposition: A | Discharge status: Home / Self Care | Payer: Managed Care, Other (non HMO) | Source: Ambulatory Visit | Attending: Family Medicine | Admitting: Family Medicine

## 2013-06-05 DIAGNOSIS — O352XX0 Maternal care for (suspected) hereditary disease in fetus, not applicable or unspecified: Secondary | ICD-10-CM | POA: Insufficient documentation

## 2013-06-05 DIAGNOSIS — O2620 Pregnancy care for patient with recurrent pregnancy loss, unspecified trimester: Secondary | ICD-10-CM | POA: Insufficient documentation

## 2013-06-05 DIAGNOSIS — E669 Obesity, unspecified: Secondary | ICD-10-CM | POA: Insufficient documentation

## 2013-06-05 DIAGNOSIS — O99210 Obesity complicating pregnancy, unspecified trimester: Secondary | ICD-10-CM

## 2013-06-05 NOTE — ED Notes (Signed)
Pt states she is feeling irregular ctx.  Has been feeling them for the last few days.  States they will be regular for a while and then stop.  Encouraged patient to call MD when ctx become more regular, or if she has any bleeding or decreased fetal movement.  Patient verbalized understanding.

## 2013-06-06 ENCOUNTER — Other Ambulatory Visit: Payer: Managed Care, Other (non HMO)

## 2013-06-07 ENCOUNTER — Telehealth: Payer: Self-pay | Admitting: *Deleted

## 2013-06-07 NOTE — Telephone Encounter (Signed)
Patient reports that when she gets up and is moving around a lot she starts to have contractions. When she rests they go away. She reports that the baby is moving well. She has an appt with MFM tomorrow for NST. i advised that she rest this afternoon and stay well hydrated. I also advised that if she develops regular contractions or baby is not moving, she develops bleeding or leakage of fluid to go to MAU. Pt agreeable. I also advised that she tell MFM that she has been experiencing contractions tomorrow while she is on monitor. Patient had no further questions.

## 2013-06-07 NOTE — Telephone Encounter (Signed)
Patient called requesting a call back regarding some concerns that she has over the past few days.

## 2013-06-08 ENCOUNTER — Ambulatory Visit (HOSPITAL_COMMUNITY)
Admission: RE | Admit: 2013-06-08 | Discharge: 2013-06-08 | Disposition: A | Discharge status: Home / Self Care | Payer: Managed Care, Other (non HMO) | Source: Ambulatory Visit | Attending: Family Medicine | Admitting: Family Medicine

## 2013-06-08 ENCOUNTER — Ambulatory Visit (HOSPITAL_COMMUNITY)
Admission: RE | Admit: 2013-06-08 | Discharge: 2013-06-08 | Disposition: A | Discharge status: Home / Self Care | Payer: Managed Care, Other (non HMO) | Source: Ambulatory Visit | Attending: Obstetrics & Gynecology | Admitting: Obstetrics & Gynecology

## 2013-06-08 VITALS — BP 133/77 | HR 122 | Wt 381.0 lb

## 2013-06-08 DIAGNOSIS — Z3689 Encounter for other specified antenatal screening: Secondary | ICD-10-CM | POA: Insufficient documentation

## 2013-06-08 DIAGNOSIS — O2620 Pregnancy care for patient with recurrent pregnancy loss, unspecified trimester: Secondary | ICD-10-CM

## 2013-06-08 DIAGNOSIS — O34219 Maternal care for unspecified type scar from previous cesarean delivery: Secondary | ICD-10-CM

## 2013-06-08 DIAGNOSIS — O09299 Supervision of pregnancy with other poor reproductive or obstetric history, unspecified trimester: Secondary | ICD-10-CM

## 2013-06-08 DIAGNOSIS — O0991 Supervision of high risk pregnancy, unspecified, first trimester: Secondary | ICD-10-CM

## 2013-06-08 DIAGNOSIS — O3663X0 Maternal care for excessive fetal growth, third trimester, not applicable or unspecified: Secondary | ICD-10-CM

## 2013-06-08 DIAGNOSIS — R000 Tachycardia, unspecified: Secondary | ICD-10-CM

## 2013-06-08 DIAGNOSIS — O99210 Obesity complicating pregnancy, unspecified trimester: Secondary | ICD-10-CM

## 2013-06-08 DIAGNOSIS — O358XX0 Maternal care for other (suspected) fetal abnormality and damage, not applicable or unspecified: Secondary | ICD-10-CM

## 2013-06-08 DIAGNOSIS — O35BXX0 Maternal care for other (suspected) fetal abnormality and damage, fetal cardiac anomalies, not applicable or unspecified: Secondary | ICD-10-CM

## 2013-06-08 DIAGNOSIS — O4400 Placenta previa specified as without hemorrhage, unspecified trimester: Secondary | ICD-10-CM

## 2013-06-08 DIAGNOSIS — O3660X0 Maternal care for excessive fetal growth, unspecified trimester, not applicable or unspecified: Secondary | ICD-10-CM | POA: Insufficient documentation

## 2013-06-12 ENCOUNTER — Ambulatory Visit (HOSPITAL_COMMUNITY)
Admission: RE | Admit: 2013-06-12 | Discharge: 2013-06-12 | Disposition: A | Discharge status: Home / Self Care | Payer: Managed Care, Other (non HMO) | Source: Ambulatory Visit | Attending: Family Medicine | Admitting: Family Medicine

## 2013-06-12 ENCOUNTER — Encounter (HOSPITAL_COMMUNITY): Payer: Self-pay

## 2013-06-12 DIAGNOSIS — O358XX0 Maternal care for other (suspected) fetal abnormality and damage, not applicable or unspecified: Secondary | ICD-10-CM | POA: Insufficient documentation

## 2013-06-12 DIAGNOSIS — O4410 Placenta previa with hemorrhage, unspecified trimester: Secondary | ICD-10-CM | POA: Insufficient documentation

## 2013-06-12 DIAGNOSIS — O2620 Pregnancy care for patient with recurrent pregnancy loss, unspecified trimester: Secondary | ICD-10-CM | POA: Insufficient documentation

## 2013-06-12 DIAGNOSIS — O0990 Supervision of high risk pregnancy, unspecified, unspecified trimester: Secondary | ICD-10-CM | POA: Insufficient documentation

## 2013-06-13 ENCOUNTER — Ambulatory Visit (INDEPENDENT_AMBULATORY_CARE_PROVIDER_SITE_OTHER): Payer: Managed Care, Other (non HMO) | Admitting: Family Medicine

## 2013-06-13 ENCOUNTER — Encounter: Payer: Managed Care, Other (non HMO) | Admitting: Family Medicine

## 2013-06-13 ENCOUNTER — Ambulatory Visit (HOSPITAL_COMMUNITY): Payer: Managed Care, Other (non HMO)

## 2013-06-13 VITALS — BP 122/82 | HR 84 | Temp 98.2°F | Wt 376.0 lb

## 2013-06-13 DIAGNOSIS — R0609 Other forms of dyspnea: Secondary | ICD-10-CM

## 2013-06-13 DIAGNOSIS — O0991 Supervision of high risk pregnancy, unspecified, first trimester: Secondary | ICD-10-CM

## 2013-06-13 DIAGNOSIS — R0989 Other specified symptoms and signs involving the circulatory and respiratory systems: Secondary | ICD-10-CM

## 2013-06-13 DIAGNOSIS — O2620 Pregnancy care for patient with recurrent pregnancy loss, unspecified trimester: Secondary | ICD-10-CM

## 2013-06-13 DIAGNOSIS — O09299 Supervision of pregnancy with other poor reproductive or obstetric history, unspecified trimester: Secondary | ICD-10-CM

## 2013-06-13 DIAGNOSIS — R0600 Dyspnea, unspecified: Secondary | ICD-10-CM

## 2013-06-13 DIAGNOSIS — O34219 Maternal care for unspecified type scar from previous cesarean delivery: Secondary | ICD-10-CM

## 2013-06-13 DIAGNOSIS — O0990 Supervision of high risk pregnancy, unspecified, unspecified trimester: Secondary | ICD-10-CM

## 2013-06-13 DIAGNOSIS — O4400 Placenta previa specified as without hemorrhage, unspecified trimester: Secondary | ICD-10-CM

## 2013-06-13 DIAGNOSIS — O4410 Placenta previa with hemorrhage, unspecified trimester: Secondary | ICD-10-CM

## 2013-06-13 NOTE — Progress Notes (Signed)
Pt. Refusing routine GBS and cultures today. Refused to give urine sample.

## 2013-06-13 NOTE — Patient Instructions (Signed)
Third Trimester of Pregnancy The third trimester is from week 29 through week 42, months 7 through 9. The third trimester is a time when the fetus is growing rapidly. At the end of the ninth month, the fetus is about 20 inches in length and weighs 6 10 pounds.  BODY CHANGES Your body goes through many changes during pregnancy. The changes vary from woman to woman.   Your weight will continue to increase. You can expect to gain 25 35 pounds (11 16 kg) by the end of the pregnancy.  You may begin to get stretch marks on your hips, abdomen, and breasts.  You may urinate more often because the fetus is moving lower into your pelvis and pressing on your bladder.  You may develop or continue to have heartburn as a result of your pregnancy.  You may develop constipation because certain hormones are causing the muscles that push waste through your intestines to slow down.  You may develop hemorrhoids or swollen, bulging veins (varicose veins).  You may have pelvic pain because of the weight gain and pregnancy hormones relaxing your joints between the bones in your pelvis. Back aches may result from over exertion of the muscles supporting your posture.  Your breasts will continue to grow and be tender. A yellow discharge may leak from your breasts called colostrum.  Your belly button may stick out.  You may feel short of breath because of your expanding uterus.  You may notice the fetus "dropping," or moving lower in your abdomen.  You may have a bloody mucus discharge. This usually occurs a few days to a week before labor begins.  Your cervix becomes thin and soft (effaced) near your due date. WHAT TO EXPECT AT YOUR PRENATAL EXAMS  You will have prenatal exams every 2 weeks until week 36. Then, you will have weekly prenatal exams. During a routine prenatal visit:  You will be weighed to make sure you and the fetus are growing normally.  Your blood pressure is taken.  Your abdomen will be  measured to track your baby's growth.  The fetal heartbeat will be listened to.  Any test results from the previous visit will be discussed.  You may have a cervical check near your due date to see if you have effaced. At around 36 weeks, your caregiver will check your cervix. At the same time, your caregiver will also perform a test on the secretions of the vaginal tissue. This test is to determine if a type of bacteria, Group B streptococcus, is present. Your caregiver will explain this further. Your caregiver may ask you:  What your birth plan is.  How you are feeling.  If you are feeling the baby move.  If you have had any abnormal symptoms, such as leaking fluid, bleeding, severe headaches, or abdominal cramping.  If you have any questions. Other tests or screenings that may be performed during your third trimester include:  Blood tests that check for low iron levels (anemia).  Fetal testing to check the health, activity level, and growth of the fetus. Testing is done if you have certain medical conditions or if there are problems during the pregnancy. FALSE LABOR You may feel small, irregular contractions that eventually go away. These are called Braxton Hicks contractions, or false labor. Contractions may last for hours, days, or even weeks before true labor sets in. If contractions come at regular intervals, intensify, or become painful, it is best to be seen by your caregiver.  SIGNS OF LABOR   Menstrual-like cramps.  Contractions that are 5 minutes apart or less.  Contractions that start on the top of the uterus and spread down to the lower abdomen and back.  A sense of increased pelvic pressure or back pain.  A watery or bloody mucus discharge that comes from the vagina. If you have any of these signs before the 37th week of pregnancy, call your caregiver right away. You need to go to the hospital to get checked immediately. HOME CARE INSTRUCTIONS   Avoid all  smoking, herbs, alcohol, and unprescribed drugs. These chemicals affect the formation and growth of the baby.  Follow your caregiver's instructions regarding medicine use. There are medicines that are either safe or unsafe to take during pregnancy.  Exercise only as directed by your caregiver. Experiencing uterine cramps is a good sign to stop exercising.  Continue to eat regular, healthy meals.  Wear a good support bra for breast tenderness.  Do not use hot tubs, steam rooms, or saunas.  Wear your seat belt at all times when driving.  Avoid raw meat, uncooked cheese, cat litter boxes, and soil used by cats. These carry germs that can cause birth defects in the baby.  Take your prenatal vitamins.  Try taking a stool softener (if your caregiver approves) if you develop constipation. Eat more high-fiber foods, such as fresh vegetables or fruit and whole grains. Drink plenty of fluids to keep your urine clear or pale yellow.  Take warm sitz baths to soothe any pain or discomfort caused by hemorrhoids. Use hemorrhoid cream if your caregiver approves.  If you develop varicose veins, wear support hose. Elevate your feet for 15 minutes, 3 4 times a day. Limit salt in your diet.  Avoid heavy lifting, wear low heal shoes, and practice good posture.  Rest a lot with your legs elevated if you have leg cramps or low back pain.  Visit your dentist if you have not gone during your pregnancy. Use a soft toothbrush to brush your teeth and be gentle when you floss.  A sexual relationship may be continued unless your caregiver directs you otherwise.  Do not travel far distances unless it is absolutely necessary and only with the approval of your caregiver.  Take prenatal classes to understand, practice, and ask questions about the labor and delivery.  Make a trial run to the hospital.  Pack your hospital bag.  Prepare the baby's nursery.  Continue to go to all your prenatal visits as directed  by your caregiver. SEEK MEDICAL CARE IF:  You are unsure if you are in labor or if your water has broken.  You have dizziness.  You have mild pelvic cramps, pelvic pressure, or nagging pain in your abdominal area.  You have persistent nausea, vomiting, or diarrhea.  You have a bad smelling vaginal discharge.  You have pain with urination. SEEK IMMEDIATE MEDICAL CARE IF:   You have a fever.  You are leaking fluid from your vagina.  You have spotting or bleeding from your vagina.  You have severe abdominal cramping or pain.  You have rapid weight loss or gain.  You have shortness of breath with chest pain.  You notice sudden or extreme swelling of your face, hands, ankles, feet, or legs.  You have not felt your baby move in over an hour.  You have severe headaches that do not go away with medicine.  You have vision changes. Document Released: 01/19/2001 Document Revised: 09/27/2012 Document Reviewed:   You have severe abdominal cramping or pain.   You have rapid weight loss or gain.   You have shortness of breath with chest pain.   You notice sudden or extreme swelling of your face, hands, ankles, feet, or legs.   You have not felt your baby move in over an hour.   You have severe headaches that do not go away with medicine.   You have vision changes.  Document Released: 01/19/2001 Document Revised: 09/27/2012 Document Reviewed: 03/28/2012  [employer] Patient Information 2014 [employer].

## 2013-06-13 NOTE — Progress Notes (Signed)
S: 28 yo [id number] here for ROBV - doing well - irregular contractions - no vb, lof. +FM - getting twice weekly testing at MFM - does not want cultures today  O: see flowsheet  A?P - again refusing all testing - surgery scheduled with BTL for 39 weeks - will fill out FMLA paperwork for her for work - starting out of 06/03/13.   - pt to fax paperwork  F/u in 1 week

## 2013-06-15 ENCOUNTER — Encounter (HOSPITAL_COMMUNITY): Payer: Self-pay

## 2013-06-15 ENCOUNTER — Ambulatory Visit (HOSPITAL_COMMUNITY)
Admission: RE | Admit: 2013-06-15 | Discharge: 2013-06-15 | Disposition: A | Discharge status: Home / Self Care | Payer: Managed Care, Other (non HMO) | Source: Ambulatory Visit | Attending: Obstetrics and Gynecology | Admitting: Obstetrics and Gynecology

## 2013-06-15 ENCOUNTER — Ambulatory Visit (HOSPITAL_COMMUNITY)
Admission: RE | Admit: 2013-06-15 | Discharge: 2013-06-15 | Disposition: A | Discharge status: Home / Self Care | Payer: Managed Care, Other (non HMO) | Source: Ambulatory Visit | Attending: Family Medicine | Admitting: Family Medicine

## 2013-06-15 VITALS — BP 119/56 | HR 91 | Wt 383.0 lb

## 2013-06-15 DIAGNOSIS — R000 Tachycardia, unspecified: Secondary | ICD-10-CM

## 2013-06-15 DIAGNOSIS — O3660X0 Maternal care for excessive fetal growth, unspecified trimester, not applicable or unspecified: Secondary | ICD-10-CM | POA: Insufficient documentation

## 2013-06-15 DIAGNOSIS — O2620 Pregnancy care for patient with recurrent pregnancy loss, unspecified trimester: Secondary | ICD-10-CM

## 2013-06-15 DIAGNOSIS — O4400 Placenta previa specified as without hemorrhage, unspecified trimester: Secondary | ICD-10-CM

## 2013-06-15 DIAGNOSIS — O34219 Maternal care for unspecified type scar from previous cesarean delivery: Secondary | ICD-10-CM

## 2013-06-15 DIAGNOSIS — O3663X0 Maternal care for excessive fetal growth, third trimester, not applicable or unspecified: Secondary | ICD-10-CM

## 2013-06-15 DIAGNOSIS — O358XX0 Maternal care for other (suspected) fetal abnormality and damage, not applicable or unspecified: Secondary | ICD-10-CM

## 2013-06-15 DIAGNOSIS — O35BXX0 Maternal care for other (suspected) fetal abnormality and damage, fetal cardiac anomalies, not applicable or unspecified: Secondary | ICD-10-CM

## 2013-06-15 DIAGNOSIS — O0991 Supervision of high risk pregnancy, unspecified, first trimester: Secondary | ICD-10-CM

## 2013-06-15 DIAGNOSIS — O99210 Obesity complicating pregnancy, unspecified trimester: Secondary | ICD-10-CM

## 2013-06-15 DIAGNOSIS — O09299 Supervision of pregnancy with other poor reproductive or obstetric history, unspecified trimester: Secondary | ICD-10-CM

## 2013-06-18 ENCOUNTER — Other Ambulatory Visit: Payer: Self-pay | Admitting: Obstetrics and Gynecology

## 2013-06-18 DIAGNOSIS — E669 Obesity, unspecified: Secondary | ICD-10-CM

## 2013-06-18 DIAGNOSIS — O2620 Pregnancy care for patient with recurrent pregnancy loss, unspecified trimester: Secondary | ICD-10-CM

## 2013-06-18 DIAGNOSIS — O99210 Obesity complicating pregnancy, unspecified trimester: Secondary | ICD-10-CM

## 2013-06-18 DIAGNOSIS — O34219 Maternal care for unspecified type scar from previous cesarean delivery: Secondary | ICD-10-CM

## 2013-06-18 DIAGNOSIS — O352XX0 Maternal care for (suspected) hereditary disease in fetus, not applicable or unspecified: Secondary | ICD-10-CM

## 2013-06-18 DIAGNOSIS — O09299 Supervision of pregnancy with other poor reproductive or obstetric history, unspecified trimester: Secondary | ICD-10-CM

## 2013-06-19 ENCOUNTER — Inpatient Hospital Stay (HOSPITAL_COMMUNITY)
Admission: AD | Admit: 2013-06-19 | Discharge: 2013-06-19 | Disposition: A | Discharge status: Home / Self Care | Payer: Managed Care, Other (non HMO) | Source: Ambulatory Visit | Attending: Obstetrics & Gynecology | Admitting: Obstetrics & Gynecology

## 2013-06-19 ENCOUNTER — Other Ambulatory Visit: Payer: Self-pay | Admitting: Family Medicine

## 2013-06-19 ENCOUNTER — Ambulatory Visit (HOSPITAL_COMMUNITY)
Admission: RE | Admit: 2013-06-19 | Discharge: 2013-06-19 | Disposition: A | Discharge status: Home / Self Care | Payer: Managed Care, Other (non HMO) | Source: Ambulatory Visit | Attending: Family Medicine | Admitting: Family Medicine

## 2013-06-19 ENCOUNTER — Encounter (HOSPITAL_COMMUNITY): Payer: Self-pay | Admitting: *Deleted

## 2013-06-19 ENCOUNTER — Encounter (HOSPITAL_COMMUNITY): Payer: Self-pay | Admitting: Pharmacist

## 2013-06-19 DIAGNOSIS — O99419 Diseases of the circulatory system complicating pregnancy, unspecified trimester: Secondary | ICD-10-CM | POA: Insufficient documentation

## 2013-06-19 DIAGNOSIS — O368390 Maternal care for abnormalities of the fetal heart rate or rhythm, unspecified trimester, not applicable or unspecified: Secondary | ICD-10-CM

## 2013-06-19 DIAGNOSIS — O479 False labor, unspecified: Secondary | ICD-10-CM | POA: Insufficient documentation

## 2013-06-19 DIAGNOSIS — O4400 Placenta previa specified as without hemorrhage, unspecified trimester: Secondary | ICD-10-CM | POA: Insufficient documentation

## 2013-06-19 DIAGNOSIS — O34219 Maternal care for unspecified type scar from previous cesarean delivery: Secondary | ICD-10-CM | POA: Insufficient documentation

## 2013-06-19 DIAGNOSIS — E669 Obesity, unspecified: Secondary | ICD-10-CM | POA: Insufficient documentation

## 2013-06-19 DIAGNOSIS — Q289 Congenital malformation of circulatory system, unspecified: Secondary | ICD-10-CM

## 2013-06-19 DIAGNOSIS — Z36 Encounter for antenatal screening of mother: Secondary | ICD-10-CM

## 2013-06-19 DIAGNOSIS — Z3689 Encounter for other specified antenatal screening: Secondary | ICD-10-CM

## 2013-06-19 DIAGNOSIS — O99210 Obesity complicating pregnancy, unspecified trimester: Secondary | ICD-10-CM

## 2013-06-19 DIAGNOSIS — Q249 Congenital malformation of heart, unspecified: Secondary | ICD-10-CM | POA: Insufficient documentation

## 2013-06-19 NOTE — Discharge Instructions (Signed)
Fetal Biophysical Profile °This is a test that measures five different variables of the fetus: Heart rate, breathing movement, total movement of the baby, fetal muscle tone, the amount of amniotic fluid, and the heart rate activity of the fetus. The five variables are measured individually and contribute either a 2 or a 0 to the overall scoring of the test. The measurements are as follows: °· Fetal heart rate activity. This is measured and scored in the same way as a non-stress test. The fetal heart rate is considered reactive when there are movement-associated fetal heart rate increases of at least 15 beats per minute above baseline, and 15 seconds in duration over a 20-minute period. A score of 2 is given for reactivity, and a score of 0 indicates that the fetal heart rate is non-reactive. °· Fetal breathing movements. This is scored based on fetal breathing movements and indicate fetal well-being. Their absence may indicate a low oxygen level for the fetus. Fetal breathing increases in frequency and uniformity after the 36th week of pregnancy. To earn a score of 2, the fetus must have at least one episode of fetal breathing lasting at least 60 seconds within a 30-minute observation. Absence of this breathing is scored a 0 on the BPP. °· Fetal body movements. Fetal activity is a reflection of brain integrity and function. The presence of at least three episodes of fetal movements within a 30-minute period is given a score of 2. A score of 0 is given with two or less movements in this time period. Fetal activity is highest 1 to 3 hours after the mother has eaten a meal. °· Fetal tone. In the uterus, the fetus is normally in a position of flexion. This means the head is bent down towards the knees. The fetus also stretches, rolls, and moves in the uterus. The arms, legs, trunk, and head may be flexed and extended. A score of 2 is earned when there is at least one episode of active extension with return flexion. A  score of 0 is given for slow extension with a return to only partial flexion. Fetal movement not followed by return to flexion, limbs or spine in extension, and an open fetal hand score 0. °· Amniotic fluid volume. Amniotic fluid volume has been demonstrated to be a good method of predicting fetal distress. Too little amniotic fluid has been associated with fetal abnormalities, slow uterine growth, and over due pregnancy. A score of 2 is given for this when there is at least one pocket of amniotic fluid that measures 1 cm in a specific area. A score of 0 indicates either that fluid is absent in most areas of the uterine cavity or that the largest pocket of fluid measures less than 1 cm. °PREPARATION FOR TEST °No preparation or fasting is necessary. °NORMAL FINDINGS °· A score of 8-10 points (if amniotic fluid volume is adequate). °· Possible critical values: Less than 4 may necessitate immediate delivery of fetus. °Ranges for normal findings may vary among different laboratories and hospitals. You should always check with your doctor after having lab work or other tests done to discuss the meaning of your test results and whether your values are considered within normal limits. °MEANING OF TEST  °Your caregiver will go over the test results with you and discuss the importance and meaning of your results, as well as treatment options and the need for additional tests if necessary. °OBTAINING THE TEST RESULTS  °It is your responsibility to obtain your test   results. Ask the lab or department performing the test when and how you will get your results. Document Released: 05/28/2004 Document Revised: 04/19/2011 Document Reviewed: 01/05/2008 [facility] Patient Information 2014 [place], [place]. Nonstress Test The nonstress test is a procedure that monitors the fetus's heartbeat. The test will monitor the heartbeat when the fetus is at rest and while the fetus is moving. In a healthy fetus, there will be an increase in  fetal heart rate when the fetus moves or kicks. The heart rate will decrease at rest. This test helps determine if the fetus is healthy. The test may take up to a half hour. Your caregiver will look at a number of patterns in the heart rate tracing to make sure your baby is thriving. If there is concern, your caregiver may order additional tests or may suggest another course of action. This test is often done in the third trimester and can help determine if an early delivery is needed and safe. Common reasons to have this test are:  You are past your due date.  You have a high-risk pregnancy.  You are feeling less movement than normal.  You have lost a pregnancy in the past.  Your caregiver suspects fetal growth problems.  You have too much or too little amniotic fluid. BEFORE THE PROCEDURE  Eat a meal right before the test or as directed by your caregiver. Food may help stimulate fetal movements.  Use the restroom right before the test. PROCEDURE  Two belts will be placed around your abdomen. These belts have monitors attached to them. One records the fetal heart rate and the other records uterine contractions.  You may be asked to lie down on your side or to stay sitting upright.  You may be given a button to press when you feel movement.  The fetal heartbeat is listened to and watched on a screen. The heartbeat is recorded on a sheet of paper.  If the fetus seems to be sleeping, you may be asked to drink some juice or soda, gently press your abdomen, or make some noise to wake the fetus. AFTER THE PROCEDURE  Your caregiver will discuss the test results with you and make recommendations for the near future. Document Released: 01/15/2002 Document Revised: 05/22/2012 Document Reviewed: 02/29/2012 [facility] Patient Information 2014 [place], [place].

## 2013-06-19 NOTE — MAU Provider Note (Signed)
First Provider Initiated Contact with Patient 06/19/13 1755      Chief Complaint:  Non-stress Test   Kathleen Andrews is  28 y.o. [id number] at [redacted]w[redacted]d with multiple risk factors for which she undergoes Biweekly NSA. Referred from MFM for nonreassuring FHT this afternoon.  Subsequent BPP 8/8.   Pt denies contractions, no vaginal bleeding, LOF or change in vaginal discharge.  Positive fetal movement.  Obstetrical/Gynecological History: OB History   Grav Para Term Preterm Abortions TAB SAB Ect Mult Living   10 3 3  0 6 1 5  0 0 2     Past Medical History: Past Medical History  Diagnosis Date  . Urinary tract bacterial infections   . OBESITY, NOS   . Urge urinary incontinence   . Recurrent UTI   . Axillary adenopathy   . Amenorrhea due to oral contraceptive   .  Supervision of high-risk pregnancy in first trimester   . Tachycardia   . Previous cesarean delivery, antepartum condition or complication   . Congenital heart disease, fetal, affecting care of mother, antepartum-in previous child   . Obesity complicating peripregnancy, antepartum   . History of IUGR (intrauterine growth retardation) and stillbirth, currently pregnant   . Pregnancy complicated by previous recurrent miscarriages     Past Surgical History: Past Surgical History  Procedure Laterality Date  . Ankle surgery    . Cesarean section      X 2 - failure to progress and repeat elective    Family History: Family History  Problem Relation Age of Onset  . Cancer Other   . Hypertension Other   . Hyperlipidemia Other   . Blindness Other   . Deafness Other   . Diabetes Father   . Heart disease Other     mother adopted but reported strong history    Social History: History  Substance Use Topics  . Smoking status: Never Smoker   . Smokeless tobacco: Never Used  . Alcohol Use: No    Allergies:  Allergies  Allergen Reactions  . Tomato     Hives  . Eggs Or Egg-Derived Products Hives and Rash  . Latex Hives  and Rash    Meds:  Prescriptions prior to admission  Medication Sig Dispense Refill  . acetaminophen (TYLENOL) 650 MG CR tablet Take 650 mg by mouth every 8 (eight) hours as needed for pain.      . calcium carbonate (TUMS - DOSED IN MG ELEMENTAL CALCIUM) 500 MG chewable tablet Chew 1 tablet by mouth 3 (three) times daily as needed for indigestion or heartburn.        Review of Systems -   Review of Systems  Constitutional: No malaise HEENT: No headache or vision changes CV: Intermittent tachycardia with palpitations, no chest pain Resp: no dyspnea Ab: no ctx Ext: No edema   Physical Exam  Blood pressure 123/64, pulse 89, temperature 98.7 F (37.1 C), temperature source Oral, resp. rate 18, last menstrual period 09/25/2012. GENERAL: Well-developed, well-nourished female in no acute distress.  LUNGS: Clear to auscultation bilaterally.  HEART: Regular rate and rhythm. ABDOMEN: Soft, nontender, nondistended, gravid.  EXTREMITIES: Nontender, no edema, 2+ distal pulses. CERVICAL EXAM: Not performed FHT:  Baseline 140s, + accels, moderate variability, no decels. Category I tracing. Contractions: Irregular   Labs: No results found for this or any previous visit (from the past 24 hour(s)). Imaging Studies:  US Ob Limited  06/19/2013   OBSTETRICAL ULTRASOUND: This exam was performed within a [facility].  The OB US report was generated in the AS system, and faxed to the ordering physician.   This report is also available in [employer] and in the [facility]. See AS Obstetric US report.  US Ob Limited  06/08/2013   OBSTETRICAL ULTRASOUND: This exam was performed within a [place] [facility] Department. The OB US report was generated in the AS system, and faxed to the ordering physician.   This report is also available in [employer] and in the [facility]. See AS Obstetric US report.  US Ob Limited  06/01/2013    OBSTETRICAL ULTRASOUND: This exam was performed within a [facility] Department. The OB US report was generated in the AS system, and faxed to the ordering physician.   This report is also available in [employer] and in the [facility]. See AS Obstetric US report.  US Ob Limited  05/25/2013   OBSTETRICAL ULTRASOUND: This exam was performed within a [facility] Department. The OB US report was generated in the AS system, and faxed to the ordering physician.   This report is also available in [employer] and in the [facility]. See AS Obstetric US report.  US Ob Follow Up  06/15/2013   OBSTETRICAL ULTRASOUND: This exam was performed within a [facility] Department. The OB US report was generated in the AS system, and faxed to the ordering physician.   This report is also available in [employer] and in the [facility]. See AS Obstetric US report.  US Fetal Bpp W/o Non Stress  06/19/2013   OBSTETRICAL ULTRASOUND: This exam was performed within a [facility] Department. The OB US report was generated in the AS system, and faxed to the ordering physician.   This report is also available in [employer] and in the [facility]. See AS Obstetric US report.   Assessment: Kathleen Andrews is  28 y.o. [id number] at [redacted]w[redacted]d with multiple risk factors presents from MFM for a nonreassuring FHT. Subsequent BPP 8/8. - Fetal Wellbeing: Category I tracing  Plan: 1. Keep already scheduled follow-up appointment with biweekly testing 2. Labor Precautions given  Clarisa Schools 5/12/20156:10 PM  I spoke with and examined patient and agree with resident's note and plan of care.  Fredrik Rigger, MD OB Fellow 06/19/2013 8:52 PM

## 2013-06-19 NOTE — MAU Note (Signed)
Pt sent from MFM for non-reassuring EFM strip, BPP 8/8, pt states she has some uc's, denies bleeding or LOF.

## 2013-06-21 ENCOUNTER — Ambulatory Visit (INDEPENDENT_AMBULATORY_CARE_PROVIDER_SITE_OTHER): Payer: Managed Care, Other (non HMO) | Admitting: Family Medicine

## 2013-06-21 VITALS — BP 116/79 | HR 80 | Wt 382.0 lb

## 2013-06-21 DIAGNOSIS — O0991 Supervision of high risk pregnancy, unspecified, first trimester: Secondary | ICD-10-CM

## 2013-06-21 DIAGNOSIS — O34219 Maternal care for unspecified type scar from previous cesarean delivery: Secondary | ICD-10-CM

## 2013-06-21 DIAGNOSIS — O358XX0 Maternal care for other (suspected) fetal abnormality and damage, not applicable or unspecified: Secondary | ICD-10-CM

## 2013-06-21 DIAGNOSIS — O35BXX0 Maternal care for other (suspected) fetal abnormality and damage, fetal cardiac anomalies, not applicable or unspecified: Secondary | ICD-10-CM

## 2013-06-21 DIAGNOSIS — O0990 Supervision of high risk pregnancy, unspecified, unspecified trimester: Secondary | ICD-10-CM

## 2013-06-21 NOTE — Progress Notes (Signed)
S: 28 yo [id number] here for ROBV - doing well - has questions about wound vac- doesn't want one - wants to discuss vertical vs transverse skin incisions.  - no regular ctx, lof, vb. +FM  O: see flowsheet  A/P - continues to refuse testing here - following up with MFM for twice weekly testing.  - f/u in clinic in 1 week.

## 2013-06-21 NOTE — MAU Provider Note (Signed)
Attestation of Attending Supervision of Obstetric Fellow: Evaluation and management procedures were performed by the Obstetric Fellow under my supervision and collaboration.  I have reviewed the Obstetric Fellow's note and chart, and I agree with the management and plan.  UGONNA  ANYANWU, MD, FACOG Attending Obstetrician & Gynecologist Faculty Practice, [facility]   

## 2013-06-21 NOTE — Patient Instructions (Signed)
Third Trimester of Pregnancy The third trimester is from week 29 through week 42, months 7 through 9. The third trimester is a time when the fetus is growing rapidly. At the end of the ninth month, the fetus is about 20 inches in length and weighs 6 10 pounds.  BODY CHANGES Your body goes through many changes during pregnancy. The changes vary from woman to woman.   Your weight will continue to increase. You can expect to gain 25 35 pounds (11 16 kg) by the end of the pregnancy.  You may begin to get stretch marks on your hips, abdomen, and breasts.  You may urinate more often because the fetus is moving lower into your pelvis and pressing on your bladder.  You may develop or continue to have heartburn as a result of your pregnancy.  You may develop constipation because certain hormones are causing the muscles that push waste through your intestines to slow down.  You may develop hemorrhoids or swollen, bulging veins (varicose veins).  You may have pelvic pain because of the weight gain and pregnancy hormones relaxing your joints between the bones in your pelvis. Back aches may result from over exertion of the muscles supporting your posture.  Your breasts will continue to grow and be tender. A yellow discharge may leak from your breasts called colostrum.  Your belly button may stick out.  You may feel short of breath because of your expanding uterus.  You may notice the fetus "dropping," or moving lower in your abdomen.  You may have a bloody mucus discharge. This usually occurs a few days to a week before labor begins.  Your cervix becomes thin and soft (effaced) near your due date. WHAT TO EXPECT AT YOUR PRENATAL EXAMS  You will have prenatal exams every 2 weeks until week 36. Then, you will have weekly prenatal exams. During a routine prenatal visit:  You will be weighed to make sure you and the fetus are growing normally.  Your blood pressure is taken.  Your abdomen will be  measured to track your baby's growth.  The fetal heartbeat will be listened to.  Any test results from the previous visit will be discussed.  You may have a cervical check near your due date to see if you have effaced. At around 36 weeks, your caregiver will check your cervix. At the same time, your caregiver will also perform a test on the secretions of the vaginal tissue. This test is to determine if a type of bacteria, Group B streptococcus, is present. Your caregiver will explain this further. Your caregiver may ask you:  What your birth plan is.  How you are feeling.  If you are feeling the baby move.  If you have had any abnormal symptoms, such as leaking fluid, bleeding, severe headaches, or abdominal cramping.  If you have any questions. Other tests or screenings that may be performed during your third trimester include:  Blood tests that check for low iron levels (anemia).  Fetal testing to check the health, activity level, and growth of the fetus. Testing is done if you have certain medical conditions or if there are problems during the pregnancy. FALSE LABOR You may feel small, irregular contractions that eventually go away. These are called Braxton Hicks contractions, or false labor. Contractions may last for hours, days, or even weeks before true labor sets in. If contractions come at regular intervals, intensify, or become painful, it is best to be seen by your caregiver.  SIGNS OF LABOR   Menstrual-like cramps.  Contractions that are 5 minutes apart or less.  Contractions that start on the top of the uterus and spread down to the lower abdomen and back.  A sense of increased pelvic pressure or back pain.  A watery or bloody mucus discharge that comes from the vagina. If you have any of these signs before the 37th week of pregnancy, call your caregiver right away. You need to go to the hospital to get checked immediately. HOME CARE INSTRUCTIONS   Avoid all  smoking, herbs, alcohol, and unprescribed drugs. These chemicals affect the formation and growth of the baby.  Follow your caregiver's instructions regarding medicine use. There are medicines that are either safe or unsafe to take during pregnancy.  Exercise only as directed by your caregiver. Experiencing uterine cramps is a good sign to stop exercising.  Continue to eat regular, healthy meals.  Wear a good support bra for breast tenderness.  Do not use hot tubs, steam rooms, or saunas.  Wear your seat belt at all times when driving.  Avoid raw meat, uncooked cheese, cat litter boxes, and soil used by cats. These carry germs that can cause birth defects in the baby.  Take your prenatal vitamins.  Try taking a stool softener (if your caregiver approves) if you develop constipation. Eat more high-fiber foods, such as fresh vegetables or fruit and whole grains. Drink plenty of fluids to keep your urine clear or pale yellow.  Take warm sitz baths to soothe any pain or discomfort caused by hemorrhoids. Use hemorrhoid cream if your caregiver approves.  If you develop varicose veins, wear support hose. Elevate your feet for 15 minutes, 3 4 times a day. Limit salt in your diet.  Avoid heavy lifting, wear low heal shoes, and practice good posture.  Rest a lot with your legs elevated if you have leg cramps or low back pain.  Visit your dentist if you have not gone during your pregnancy. Use a soft toothbrush to brush your teeth and be gentle when you floss.  A sexual relationship may be continued unless your caregiver directs you otherwise.  Do not travel far distances unless it is absolutely necessary and only with the approval of your caregiver.  Take prenatal classes to understand, practice, and ask questions about the labor and delivery.  Make a trial run to the hospital.  Pack your hospital bag.  Prepare the baby's nursery.  Continue to go to all your prenatal visits as directed  by your caregiver. SEEK MEDICAL CARE IF:  You are unsure if you are in labor or if your water has broken.  You have dizziness.  You have mild pelvic cramps, pelvic pressure, or nagging pain in your abdominal area.  You have persistent nausea, vomiting, or diarrhea.  You have a bad smelling vaginal discharge.  You have pain with urination. SEEK IMMEDIATE MEDICAL CARE IF:   You have a fever.  You are leaking fluid from your vagina.  You have spotting or bleeding from your vagina.  You have severe abdominal cramping or pain.  You have rapid weight loss or gain.  You have shortness of breath with chest pain.  You notice sudden or extreme swelling of your face, hands, ankles, feet, or legs.  You have not felt your baby move in over an hour.  You have severe headaches that do not go away with medicine.  You have vision changes. Document Released: 01/19/2001 Document Revised: 09/27/2012 Document Reviewed:   You have severe abdominal cramping or pain.   You have rapid weight loss or gain.   You have shortness of breath with chest pain.   You notice sudden or extreme swelling of your face, hands, ankles, feet, or legs.   You have not felt your baby move in over an hour.   You have severe headaches that do not go away with medicine.   You have vision changes.  Document Released: 01/19/2001 Document Revised: 09/27/2012 Document Reviewed: 03/28/2012  [employer] Patient Information 2014 [employer].

## 2013-06-22 ENCOUNTER — Ambulatory Visit (HOSPITAL_COMMUNITY)
Admission: RE | Admit: 2013-06-22 | Discharge: 2013-06-22 | Disposition: A | Discharge status: Home / Self Care | Payer: Managed Care, Other (non HMO) | Source: Ambulatory Visit | Attending: Family Medicine | Admitting: Family Medicine

## 2013-06-22 VITALS — BP 136/77 | HR 118 | Wt 388.0 lb

## 2013-06-22 DIAGNOSIS — O35BXX0 Maternal care for other (suspected) fetal abnormality and damage, fetal cardiac anomalies, not applicable or unspecified: Secondary | ICD-10-CM

## 2013-06-22 DIAGNOSIS — O0991 Supervision of high risk pregnancy, unspecified, first trimester: Secondary | ICD-10-CM

## 2013-06-22 DIAGNOSIS — O2620 Pregnancy care for patient with recurrent pregnancy loss, unspecified trimester: Secondary | ICD-10-CM

## 2013-06-22 DIAGNOSIS — O4400 Placenta previa specified as without hemorrhage, unspecified trimester: Secondary | ICD-10-CM | POA: Insufficient documentation

## 2013-06-22 DIAGNOSIS — O99210 Obesity complicating pregnancy, unspecified trimester: Secondary | ICD-10-CM

## 2013-06-22 DIAGNOSIS — R000 Tachycardia, unspecified: Secondary | ICD-10-CM

## 2013-06-22 DIAGNOSIS — O34219 Maternal care for unspecified type scar from previous cesarean delivery: Secondary | ICD-10-CM | POA: Insufficient documentation

## 2013-06-22 DIAGNOSIS — O09299 Supervision of pregnancy with other poor reproductive or obstetric history, unspecified trimester: Secondary | ICD-10-CM

## 2013-06-22 DIAGNOSIS — Q289 Congenital malformation of circulatory system, unspecified: Secondary | ICD-10-CM

## 2013-06-22 DIAGNOSIS — E669 Obesity, unspecified: Secondary | ICD-10-CM

## 2013-06-22 DIAGNOSIS — O358XX0 Maternal care for other (suspected) fetal abnormality and damage, not applicable or unspecified: Secondary | ICD-10-CM

## 2013-06-22 DIAGNOSIS — O352XX0 Maternal care for (suspected) hereditary disease in fetus, not applicable or unspecified: Secondary | ICD-10-CM

## 2013-06-22 DIAGNOSIS — Q249 Congenital malformation of heart, unspecified: Secondary | ICD-10-CM | POA: Insufficient documentation

## 2013-06-22 DIAGNOSIS — O99419 Diseases of the circulatory system complicating pregnancy, unspecified trimester: Secondary | ICD-10-CM | POA: Insufficient documentation

## 2013-06-23 ENCOUNTER — Encounter: Payer: Self-pay | Admitting: *Deleted

## 2013-06-26 ENCOUNTER — Ambulatory Visit (HOSPITAL_COMMUNITY)
Admission: RE | Admit: 2013-06-26 | Discharge: 2013-06-26 | Disposition: A | Discharge status: Home / Self Care | Payer: Managed Care, Other (non HMO) | Source: Ambulatory Visit | Attending: Family Medicine | Admitting: Family Medicine

## 2013-06-26 VITALS — BP 107/54 | HR 82 | Wt 387.8 lb

## 2013-06-26 DIAGNOSIS — O4400 Placenta previa specified as without hemorrhage, unspecified trimester: Secondary | ICD-10-CM

## 2013-06-26 DIAGNOSIS — O99210 Obesity complicating pregnancy, unspecified trimester: Secondary | ICD-10-CM

## 2013-06-26 DIAGNOSIS — O2620 Pregnancy care for patient with recurrent pregnancy loss, unspecified trimester: Secondary | ICD-10-CM

## 2013-06-26 DIAGNOSIS — R000 Tachycardia, unspecified: Secondary | ICD-10-CM

## 2013-06-26 DIAGNOSIS — O4410 Placenta previa with hemorrhage, unspecified trimester: Secondary | ICD-10-CM | POA: Insufficient documentation

## 2013-06-26 DIAGNOSIS — O358XX0 Maternal care for other (suspected) fetal abnormality and damage, not applicable or unspecified: Secondary | ICD-10-CM | POA: Insufficient documentation

## 2013-06-26 DIAGNOSIS — O09299 Supervision of pregnancy with other poor reproductive or obstetric history, unspecified trimester: Secondary | ICD-10-CM

## 2013-06-26 DIAGNOSIS — O34219 Maternal care for unspecified type scar from previous cesarean delivery: Secondary | ICD-10-CM

## 2013-06-26 DIAGNOSIS — O0991 Supervision of high risk pregnancy, unspecified, first trimester: Secondary | ICD-10-CM

## 2013-06-26 DIAGNOSIS — O35BXX0 Maternal care for other (suspected) fetal abnormality and damage, fetal cardiac anomalies, not applicable or unspecified: Secondary | ICD-10-CM

## 2013-06-27 ENCOUNTER — Encounter (HOSPITAL_COMMUNITY): Payer: Self-pay | Admitting: *Deleted

## 2013-06-27 NOTE — Patient Instructions (Addendum)
   Your procedure is scheduled on:  Tuesday, May 26  Enter through the Main Entrance of [facility] at: 7:30 AM Pick up the phone at the desk and dial [phone] and inform us of your arrival.  Please call this number if you have any problems the morning of surgery: [phone]  Remember: Do not eat or drink after midnight: Monday Take these medicines the morning of surgery with a SIP OF WATER:  Do not wear jewelry, make-up, or FINGER nail polish No metal in your hair or on your body. Do not wear lotions, powders, perfumes.  You may wear deodorant.  Do not bring valuables to the hospital. Contacts, dentures or bridgework may not be worn into surgery.  Leave suitcase in the car. After Surgery it may be brought to your room. For patients being admitted to the hospital, checkout time is 11:00am the day of discharge.

## 2013-06-28 ENCOUNTER — Encounter (HOSPITAL_COMMUNITY)
Admission: RE | Admit: 2013-06-28 | Discharge: 2013-06-28 | Disposition: A | Discharge status: Home / Self Care | Payer: Managed Care, Other (non HMO) | Source: Ambulatory Visit | Attending: Obstetrics & Gynecology | Admitting: Obstetrics & Gynecology

## 2013-06-28 ENCOUNTER — Encounter (HOSPITAL_COMMUNITY): Payer: Self-pay

## 2013-06-28 VITALS — BP 140/100 | HR 93 | Temp 98.4°F | Resp 20 | Ht 64.0 in | Wt 386.4 lb

## 2013-06-28 DIAGNOSIS — O09299 Supervision of pregnancy with other poor reproductive or obstetric history, unspecified trimester: Secondary | ICD-10-CM

## 2013-06-28 DIAGNOSIS — R000 Tachycardia, unspecified: Secondary | ICD-10-CM

## 2013-06-28 DIAGNOSIS — O99210 Obesity complicating pregnancy, unspecified trimester: Secondary | ICD-10-CM

## 2013-06-28 DIAGNOSIS — O35BXX0 Maternal care for other (suspected) fetal abnormality and damage, fetal cardiac anomalies, not applicable or unspecified: Secondary | ICD-10-CM

## 2013-06-28 DIAGNOSIS — O4400 Placenta previa specified as without hemorrhage, unspecified trimester: Secondary | ICD-10-CM

## 2013-06-28 DIAGNOSIS — O0991 Supervision of high risk pregnancy, unspecified, first trimester: Secondary | ICD-10-CM

## 2013-06-28 DIAGNOSIS — O2620 Pregnancy care for patient with recurrent pregnancy loss, unspecified trimester: Secondary | ICD-10-CM

## 2013-06-28 DIAGNOSIS — O34219 Maternal care for unspecified type scar from previous cesarean delivery: Secondary | ICD-10-CM

## 2013-06-28 DIAGNOSIS — O358XX0 Maternal care for other (suspected) fetal abnormality and damage, not applicable or unspecified: Secondary | ICD-10-CM

## 2013-06-28 HISTORY — DX: Anemia, unspecified: D64.9

## 2013-06-28 HISTORY — DX: Headache: R51

## 2013-06-28 HISTORY — DX: Other complications of anesthesia, initial encounter: T88.59XA

## 2013-06-28 HISTORY — DX: Chronic kidney disease, unspecified: N18.9

## 2013-06-28 HISTORY — DX: Anxiety disorder, unspecified: F41.9

## 2013-06-28 HISTORY — DX: Gastro-esophageal reflux disease without esophagitis: K21.9

## 2013-06-28 HISTORY — DX: Major depressive disorder, single episode, unspecified: F32.9

## 2013-06-28 HISTORY — DX: Shortness of breath: R06.02

## 2013-06-28 HISTORY — DX: Malignant (primary) neoplasm, unspecified: C80.1

## 2013-06-28 HISTORY — DX: Cardiac arrhythmia, unspecified: I49.9

## 2013-06-28 HISTORY — DX: Depression, unspecified: F32.A

## 2013-06-28 HISTORY — DX: Adverse effect of unspecified anesthetic, initial encounter: T41.45XA

## 2013-06-28 LAB — TYPE AND SCREEN
ABO/RH(D): O POS
ANTIBODY SCREEN: NEGATIVE

## 2013-06-28 LAB — CBC
HEMATOCRIT: 38.6 % (ref 36.0–46.0)
HEMOGLOBIN: 12.4 g/dL (ref 12.0–15.0)
MCH: 24.7 pg — ABNORMAL LOW (ref 26.0–34.0)
MCHC: 32.1 g/dL (ref 30.0–36.0)
MCV: 76.9 fL — ABNORMAL LOW (ref 78.0–100.0)
Platelets: 202 10*3/uL (ref 150–400)
RBC: 5.02 MIL/uL (ref 3.87–5.11)
RDW: 15.1 % (ref 11.5–15.5)
WBC: 10.2 10*3/uL (ref 4.0–10.5)

## 2013-06-28 LAB — ABO/RH: ABO/RH(D): O POS

## 2013-06-28 LAB — RPR

## 2013-06-28 NOTE — Patient Instructions (Addendum)
Your procedure is scheduled on: Tuesday, Jul 03, 2013  Enter through the [employer] [facility] at: 7:30am  Pick up the phone at the desk and dial [phone].  Call this number if you have problems the morning of surgery: [phone].  Remember: Do NOT eat food: AFTER MIDNIGHT MONDAY Do NOT drink clear liquids after: AFTER MIDNIGHT MONDAY Take these medicines the morning of surgery with a SIP OF WATER: NONE  Do NOT wear jewelry (body piercing), metal hair clips/bobby pins, make-up, or nail polish. Do NOT wear lotions, powders, or perfumes.  You may wear deoderant. Do NOT shave for 48 hours prior to surgery. Do NOT bring valuables to the hospital. Contacts, dentures, or bridgework may not be worn into surgery. Leave suitcase in car.  After surgery it may be brought to your room.  For patients admitted to the hospital, checkout time is 11:00 AM the day of discharge.

## 2013-06-28 NOTE — Pre-Procedure Instructions (Signed)
Dr. Cassidy made aware of pts history 

## 2013-06-28 NOTE — Anesthesia Preprocedure Evaluation (Addendum)
Anesthesia Evaluation  Patient identified by MRN, date of birth, ID band Patient awake    Reviewed: Allergy & Precautions, H&P , NPO status , Patient's Chart, lab work & pertinent test results, reviewed documented beta blocker date and time   History of Anesthesia Complications History of anesthetic complications: "low BP" with GA.  Airway Mallampati: III TM Distance: >3 FB Neck ROM: full    Dental  (+) Poor Dentition Pt reports none are loose:   Pulmonary shortness of breath (with pregnancy-related tachycardia),  breath sounds clear to auscultation        Cardiovascular + dysrhythmias (pregnancy-related tachycardia) Rhythm:regular Rate:Normal     Neuro/Psych  Headaches (infrequent migraines), Anxiety Depression    GI/Hepatic Neg liver ROS, GERD- (with pregnancy)  Medicated,  Endo/Other  Morbid obesity  Renal/GU Renal disease (h/o kidney stones)  negative genitourinary   Musculoskeletal   Abdominal   Peds  Hematology negative hematology ROS (+)   Anesthesia Other Findings   Reproductive/Obstetrics (+) Pregnancy (h/o prior c/s x3, for repeat and BTL)                          Anesthesia Physical Anesthesia Plan  ASA: III  Anesthesia Plan: Combined Spinal and Epidural   Post-op Pain Management:    Induction:   Airway Management Planned:   Additional Equipment:   Intra-op Plan:   Post-operative Plan:   Informed Consent: I have reviewed the patients History and Physical, chart, labs and discussed the procedure including the risks, benefits and alternatives for the proposed anesthesia with the patient or authorized representative who has indicated his/her understanding and acceptance.     Plan Discussed with: Surgeon and CRNA  Anesthesia Plan Comments:         Anesthesia Quick Evaluation

## 2013-06-29 ENCOUNTER — Ambulatory Visit (HOSPITAL_COMMUNITY)
Admission: RE | Admit: 2013-06-29 | Discharge: 2013-06-29 | Disposition: A | Discharge status: Home / Self Care | Payer: Managed Care, Other (non HMO) | Source: Ambulatory Visit | Attending: Family Medicine | Admitting: Family Medicine

## 2013-06-29 ENCOUNTER — Encounter (HOSPITAL_COMMUNITY): Payer: Self-pay

## 2013-06-29 ENCOUNTER — Ambulatory Visit (INDEPENDENT_AMBULATORY_CARE_PROVIDER_SITE_OTHER): Payer: Managed Care, Other (non HMO) | Admitting: Family Medicine

## 2013-06-29 VITALS — BP 124/85 | HR 82 | Temp 98.0°F | Wt 388.4 lb

## 2013-06-29 DIAGNOSIS — E669 Obesity, unspecified: Secondary | ICD-10-CM

## 2013-06-29 DIAGNOSIS — O352XX0 Maternal care for (suspected) hereditary disease in fetus, not applicable or unspecified: Secondary | ICD-10-CM

## 2013-06-29 DIAGNOSIS — O34219 Maternal care for unspecified type scar from previous cesarean delivery: Secondary | ICD-10-CM

## 2013-06-29 DIAGNOSIS — O2620 Pregnancy care for patient with recurrent pregnancy loss, unspecified trimester: Secondary | ICD-10-CM

## 2013-06-29 DIAGNOSIS — O0990 Supervision of high risk pregnancy, unspecified, unspecified trimester: Secondary | ICD-10-CM

## 2013-06-29 DIAGNOSIS — O358XX0 Maternal care for other (suspected) fetal abnormality and damage, not applicable or unspecified: Secondary | ICD-10-CM

## 2013-06-29 DIAGNOSIS — O99210 Obesity complicating pregnancy, unspecified trimester: Secondary | ICD-10-CM

## 2013-06-29 DIAGNOSIS — O09299 Supervision of pregnancy with other poor reproductive or obstetric history, unspecified trimester: Secondary | ICD-10-CM

## 2013-06-29 DIAGNOSIS — O0991 Supervision of high risk pregnancy, unspecified, first trimester: Secondary | ICD-10-CM

## 2013-06-29 DIAGNOSIS — O35BXX0 Maternal care for other (suspected) fetal abnormality and damage, fetal cardiac anomalies, not applicable or unspecified: Secondary | ICD-10-CM

## 2013-06-29 NOTE — Patient Instructions (Signed)
Third Trimester of Pregnancy The third trimester is from week 29 through week 42, months 7 through 9. The third trimester is a time when the fetus is growing rapidly. At the end of the ninth month, the fetus is about 20 inches in length and weighs 6 10 pounds.  BODY CHANGES Your body goes through many changes during pregnancy. The changes vary from woman to woman.   Your weight will continue to increase. You can expect to gain 25 35 pounds (11 16 kg) by the end of the pregnancy.  You may begin to get stretch marks on your hips, abdomen, and breasts.  You may urinate more often because the fetus is moving lower into your pelvis and pressing on your bladder.  You may develop or continue to have heartburn as a result of your pregnancy.  You may develop constipation because certain hormones are causing the muscles that push waste through your intestines to slow down.  You may develop hemorrhoids or swollen, bulging veins (varicose veins).  You may have pelvic pain because of the weight gain and pregnancy hormones relaxing your joints between the bones in your pelvis. Back aches may result from over exertion of the muscles supporting your posture.  Your breasts will continue to grow and be tender. A yellow discharge may leak from your breasts called colostrum.  Your belly button may stick out.  You may feel short of breath because of your expanding uterus.  You may notice the fetus "dropping," or moving lower in your abdomen.  You may have a bloody mucus discharge. This usually occurs a few days to a week before labor begins.  Your cervix becomes thin and soft (effaced) near your due date. WHAT TO EXPECT AT YOUR PRENATAL EXAMS  You will have prenatal exams every 2 weeks until week 36. Then, you will have weekly prenatal exams. During a routine prenatal visit:  You will be weighed to make sure you and the fetus are growing normally.  Your blood pressure is taken.  Your abdomen will be  measured to track your baby's growth.  The fetal heartbeat will be listened to.  Any test results from the previous visit will be discussed.  You may have a cervical check near your due date to see if you have effaced. At around 36 weeks, your caregiver will check your cervix. At the same time, your caregiver will also perform a test on the secretions of the vaginal tissue. This test is to determine if a type of bacteria, Group B streptococcus, is present. Your caregiver will explain this further. Your caregiver may ask you:  What your birth plan is.  How you are feeling.  If you are feeling the baby move.  If you have had any abnormal symptoms, such as leaking fluid, bleeding, severe headaches, or abdominal cramping.  If you have any questions. Other tests or screenings that may be performed during your third trimester include:  Blood tests that check for low iron levels (anemia).  Fetal testing to check the health, activity level, and growth of the fetus. Testing is done if you have certain medical conditions or if there are problems during the pregnancy. FALSE LABOR You may feel small, irregular contractions that eventually go away. These are called Braxton Hicks contractions, or false labor. Contractions may last for hours, days, or even weeks before true labor sets in. If contractions come at regular intervals, intensify, or become painful, it is best to be seen by your caregiver.  SIGNS OF LABOR   Menstrual-like cramps.  Contractions that are 5 minutes apart or less.  Contractions that start on the top of the uterus and spread down to the lower abdomen and back.  A sense of increased pelvic pressure or back pain.  A watery or bloody mucus discharge that comes from the vagina. If you have any of these signs before the 37th week of pregnancy, call your caregiver right away. You need to go to the hospital to get checked immediately. HOME CARE INSTRUCTIONS   Avoid all  smoking, herbs, alcohol, and unprescribed drugs. These chemicals affect the formation and growth of the baby.  Follow your caregiver's instructions regarding medicine use. There are medicines that are either safe or unsafe to take during pregnancy.  Exercise only as directed by your caregiver. Experiencing uterine cramps is a good sign to stop exercising.  Continue to eat regular, healthy meals.  Wear a good support bra for breast tenderness.  Do not use hot tubs, steam rooms, or saunas.  Wear your seat belt at all times when driving.  Avoid raw meat, uncooked cheese, cat litter boxes, and soil used by cats. These carry germs that can cause birth defects in the baby.  Take your prenatal vitamins.  Try taking a stool softener (if your caregiver approves) if you develop constipation. Eat more high-fiber foods, such as fresh vegetables or fruit and whole grains. Drink plenty of fluids to keep your urine clear or pale yellow.  Take warm sitz baths to soothe any pain or discomfort caused by hemorrhoids. Use hemorrhoid cream if your caregiver approves.  If you develop varicose veins, wear support hose. Elevate your feet for 15 minutes, 3 4 times a day. Limit salt in your diet.  Avoid heavy lifting, wear low heal shoes, and practice good posture.  Rest a lot with your legs elevated if you have leg cramps or low back pain.  Visit your dentist if you have not gone during your pregnancy. Use a soft toothbrush to brush your teeth and be gentle when you floss.  A sexual relationship may be continued unless your caregiver directs you otherwise.  Do not travel far distances unless it is absolutely necessary and only with the approval of your caregiver.  Take prenatal classes to understand, practice, and ask questions about the labor and delivery.  Make a trial run to the hospital.  Pack your hospital bag.  Prepare the baby's nursery.  Continue to go to all your prenatal visits as directed  by your caregiver. SEEK MEDICAL CARE IF:  You are unsure if you are in labor or if your water has broken.  You have dizziness.  You have mild pelvic cramps, pelvic pressure, or nagging pain in your abdominal area.  You have persistent nausea, vomiting, or diarrhea.  You have a bad smelling vaginal discharge.  You have pain with urination. SEEK IMMEDIATE MEDICAL CARE IF:   You have a fever.  You are leaking fluid from your vagina.  You have spotting or bleeding from your vagina.  You have severe abdominal cramping or pain.  You have rapid weight loss or gain.  You have shortness of breath with chest pain.  You notice sudden or extreme swelling of your face, hands, ankles, feet, or legs.  You have not felt your baby move in over an hour.  You have severe headaches that do not go away with medicine.  You have vision changes. Document Released: 01/19/2001 Document Revised: 09/27/2012 Document Reviewed:   You have severe abdominal cramping or pain.   You have rapid weight loss or gain.   You have shortness of breath with chest pain.   You notice sudden or extreme swelling of your face, hands, ankles, feet, or legs.   You have not felt your baby move in over an hour.   You have severe headaches that do not go away with medicine.   You have vision changes.  Document Released: 01/19/2001 Document Revised: 09/27/2012 Document Reviewed: 03/28/2012  [employer] Patient Information 2014 [employer].

## 2013-06-29 NOTE — Progress Notes (Signed)
28 yo [id number] here for ROBV - having some contractions. None regular.  - scheduled c/s in 4 days - no concerns.  _ +FM, no lof, vb  O: see flowsheet  A/P - still refusing urines and labs regularly  - in twice weekly testing  - bp recheck normal - f/u in 4 days for scheduled repeat c/s with BTL

## 2013-06-29 NOTE — Progress Notes (Signed)
Concerned with Bp - will discuss with provider. Refusing cultures for GBS/ GC/Chlam

## 2013-07-03 ENCOUNTER — Encounter (HOSPITAL_COMMUNITY)
Admission: RE | Disposition: A | Discharge status: Home / Self Care | Payer: Managed Care, Other (non HMO) | Source: Ambulatory Visit | Attending: Obstetrics & Gynecology

## 2013-07-03 ENCOUNTER — Encounter (HOSPITAL_COMMUNITY): Payer: Managed Care, Other (non HMO) | Admitting: Anesthesiology

## 2013-07-03 ENCOUNTER — Encounter (HOSPITAL_COMMUNITY): Payer: Self-pay | Admitting: *Deleted

## 2013-07-03 ENCOUNTER — Inpatient Hospital Stay (HOSPITAL_COMMUNITY)
Admission: RE | Admit: 2013-07-03 | Discharge: 2013-07-06 | DRG: 765 | Disposition: A | Discharge status: Home / Self Care | Payer: Managed Care, Other (non HMO) | Source: Ambulatory Visit | Attending: Obstetrics & Gynecology | Admitting: Obstetrics & Gynecology

## 2013-07-03 ENCOUNTER — Inpatient Hospital Stay (HOSPITAL_COMMUNITY): Payer: Managed Care, Other (non HMO) | Admitting: Anesthesiology

## 2013-07-03 DIAGNOSIS — Z6841 Body Mass Index (BMI) 40.0 and over, adult: Secondary | ICD-10-CM

## 2013-07-03 DIAGNOSIS — O34219 Maternal care for unspecified type scar from previous cesarean delivery: Secondary | ICD-10-CM

## 2013-07-03 DIAGNOSIS — Z833 Family history of diabetes mellitus: Secondary | ICD-10-CM

## 2013-07-03 DIAGNOSIS — Z302 Encounter for sterilization: Secondary | ICD-10-CM

## 2013-07-03 DIAGNOSIS — Z9889 Other specified postprocedural states: Secondary | ICD-10-CM

## 2013-07-03 DIAGNOSIS — O26839 Pregnancy related renal disease, unspecified trimester: Secondary | ICD-10-CM

## 2013-07-03 DIAGNOSIS — O358XX0 Maternal care for other (suspected) fetal abnormality and damage, not applicable or unspecified: Secondary | ICD-10-CM

## 2013-07-03 DIAGNOSIS — O99210 Obesity complicating pregnancy, unspecified trimester: Secondary | ICD-10-CM

## 2013-07-03 DIAGNOSIS — N189 Chronic kidney disease, unspecified: Secondary | ICD-10-CM | POA: Diagnosis present

## 2013-07-03 DIAGNOSIS — O09299 Supervision of pregnancy with other poor reproductive or obstetric history, unspecified trimester: Secondary | ICD-10-CM

## 2013-07-03 DIAGNOSIS — E669 Obesity, unspecified: Secondary | ICD-10-CM | POA: Diagnosis present

## 2013-07-03 DIAGNOSIS — Z87442 Personal history of urinary calculi: Secondary | ICD-10-CM

## 2013-07-03 DIAGNOSIS — O99214 Obesity complicating childbirth: Secondary | ICD-10-CM

## 2013-07-03 DIAGNOSIS — F341 Dysthymic disorder: Secondary | ICD-10-CM | POA: Diagnosis present

## 2013-07-03 DIAGNOSIS — O2620 Pregnancy care for patient with recurrent pregnancy loss, unspecified trimester: Secondary | ICD-10-CM

## 2013-07-03 DIAGNOSIS — O99344 Other mental disorders complicating childbirth: Secondary | ICD-10-CM | POA: Diagnosis present

## 2013-07-03 DIAGNOSIS — O4400 Placenta previa specified as without hemorrhage, unspecified trimester: Secondary | ICD-10-CM

## 2013-07-03 DIAGNOSIS — O35BXX0 Maternal care for other (suspected) fetal abnormality and damage, fetal cardiac anomalies, not applicable or unspecified: Secondary | ICD-10-CM

## 2013-07-03 DIAGNOSIS — K219 Gastro-esophageal reflux disease without esophagitis: Secondary | ICD-10-CM | POA: Diagnosis present

## 2013-07-03 DIAGNOSIS — Z87440 Personal history of urinary (tract) infections: Secondary | ICD-10-CM

## 2013-07-03 DIAGNOSIS — O0991 Supervision of high risk pregnancy, unspecified, first trimester: Secondary | ICD-10-CM

## 2013-07-03 DIAGNOSIS — R000 Tachycardia, unspecified: Secondary | ICD-10-CM

## 2013-07-03 LAB — CREATININE, SERUM
Creatinine, Ser: 0.62 mg/dL (ref 0.50–1.10)
GFR calc non Af Amer: >90 mL/min (ref >90)

## 2013-07-03 LAB — CBC
HCT: 32.2 % — ABNORMAL LOW (ref 36.0–46.0)
Hemoglobin: 10.3 g/dL — ABNORMAL LOW (ref 12.0–15.0)
MCH: 24.4 pg — AB (ref 26.0–34.0)
MCHC: 32 g/dL (ref 30.0–36.0)
MCV: 76.3 fL — AB (ref 78.0–100.0)
PLATELETS: 166 10*3/uL (ref 150–400)
RBC: 4.22 MIL/uL (ref 3.87–5.11)
RDW: 15.2 % (ref 11.5–15.5)
WBC: 14.6 10*3/uL — AB (ref 4.0–10.5)

## 2013-07-03 LAB — PREPARE RBC (CROSSMATCH)

## 2013-07-03 SURGERY — Surgical Case
Anesthesia: Spinal | Site: Abdomen

## 2013-07-03 MED ORDER — SIMETHICONE 80 MG PO CHEW: 80.0000 mg | CHEWABLE_TABLET | ORAL | Status: DC | PRN

## 2013-07-03 MED ORDER — ZOLPIDEM TARTRATE 5 MG PO TABS: 5.0000 mg | ORAL_TABLET | Freq: Every evening | ORAL | Status: DC | PRN

## 2013-07-03 MED ORDER — SENNOSIDES-DOCUSATE SODIUM 8.6-50 MG PO TABS
2.0000 | ORAL_TABLET | ORAL | Status: DC
  Administered 2013-07-04 – 2013-07-05 (×2): 2 via ORAL
  Filled 2013-07-03 (×2): qty 2

## 2013-07-03 MED ORDER — KETOROLAC TROMETHAMINE 60 MG/2ML IM SOLN
60.0000 mg | Freq: Once | INTRAMUSCULAR | Status: AC | PRN
  Administered 2013-07-03: 60 mg via INTRAMUSCULAR

## 2013-07-03 MED ORDER — FENTANYL CITRATE 0.05 MG/ML IJ SOLN: 25.0000 ug | INTRAMUSCULAR | Status: DC | PRN

## 2013-07-03 MED ORDER — PHENYLEPHRINE 8 MG IN D5W 100 ML (0.08MG/ML) PREMIX OPTIME
INJECTION | INTRAVENOUS | Status: AC
  Filled 2013-07-03: qty 100

## 2013-07-03 MED ORDER — TETANUS-DIPHTH-ACELL PERTUSSIS 5-2.5-18.5 LF-MCG/0.5 IM SUSP: 0.5000 mL | Freq: Once | INTRAMUSCULAR | Status: DC

## 2013-07-03 MED ORDER — IBUPROFEN 600 MG PO TABS
600.0000 mg | ORAL_TABLET | Freq: Four times a day (QID) | ORAL | Status: DC
  Administered 2013-07-04 – 2013-07-06 (×10): 600 mg via ORAL
  Filled 2013-07-03 (×11): qty 1

## 2013-07-03 MED ORDER — NALBUPHINE HCL 10 MG/ML IJ SOLN: 5.0000 mg | INTRAMUSCULAR | Status: DC | PRN

## 2013-07-03 MED ORDER — LANOLIN HYDROUS EX OINT: 1.0000 "application " | TOPICAL_OINTMENT | CUTANEOUS | Status: DC | PRN

## 2013-07-03 MED ORDER — MORPHINE SULFATE 0.5 MG/ML IJ SOLN
INTRAMUSCULAR | Status: AC
  Filled 2013-07-03: qty 10

## 2013-07-03 MED ORDER — PRENATAL MULTIVITAMIN CH
1.0000 | ORAL_TABLET | Freq: Every day | ORAL | Status: DC
  Administered 2013-07-04: 1 via ORAL
  Filled 2013-07-03: qty 1

## 2013-07-03 MED ORDER — LIDOCAINE-EPINEPHRINE (PF) 2 %-1:200000 IJ SOLN
INTRAMUSCULAR | Status: DC | PRN
  Administered 2013-07-03 (×2): 3 mL via EPIDURAL
  Administered 2013-07-03: 2 mL via EPIDURAL

## 2013-07-03 MED ORDER — FENTANYL CITRATE 0.05 MG/ML IJ SOLN
INTRAMUSCULAR | Status: AC
  Filled 2013-07-03: qty 2

## 2013-07-03 MED ORDER — KETOROLAC TROMETHAMINE 60 MG/2ML IM SOLN
INTRAMUSCULAR | Status: AC
  Filled 2013-07-03: qty 2

## 2013-07-03 MED ORDER — MIDAZOLAM HCL 2 MG/2ML IJ SOLN
INTRAMUSCULAR | Status: DC | PRN
  Administered 2013-07-03: 1 mg via INTRAVENOUS

## 2013-07-03 MED ORDER — SODIUM CHLORIDE 0.9 % IJ SOLN: 3.0000 mL | INTRAMUSCULAR | Status: DC | PRN

## 2013-07-03 MED ORDER — DIPHENHYDRAMINE HCL 25 MG PO CAPS: 25.0000 mg | ORAL_CAPSULE | Freq: Four times a day (QID) | ORAL | Status: DC | PRN

## 2013-07-03 MED ORDER — LACTATED RINGERS IV SOLN
INTRAVENOUS | Status: DC
  Administered 2013-07-03 (×2): via INTRAVENOUS

## 2013-07-03 MED ORDER — MIDAZOLAM HCL 2 MG/2ML IJ SOLN
INTRAMUSCULAR | Status: AC
  Filled 2013-07-03: qty 2

## 2013-07-03 MED ORDER — OXYTOCIN 40 UNITS IN LACTATED RINGERS INFUSION - SIMPLE MED
62.5000 mL/h | INTRAVENOUS | Status: AC
Start: 2013-07-03 — End: 2013-07-04

## 2013-07-03 MED ORDER — LACTATED RINGERS IV SOLN
INTRAVENOUS | Status: DC
  Administered 2013-07-03: 20:00:00 via INTRAVENOUS

## 2013-07-03 MED ORDER — SCOPOLAMINE 1 MG/3DAYS TD PT72: 1.0000 | MEDICATED_PATCH | Freq: Once | TRANSDERMAL | Status: DC

## 2013-07-03 MED ORDER — KETOROLAC TROMETHAMINE 30 MG/ML IJ SOLN: 30.0000 mg | Freq: Four times a day (QID) | INTRAMUSCULAR | Status: AC | PRN

## 2013-07-03 MED ORDER — LACTATED RINGERS IV SOLN
40.0000 [IU] | INTRAVENOUS | Status: DC | PRN
  Administered 2013-07-03: 40 [IU] via INTRAVENOUS

## 2013-07-03 MED ORDER — FLEET ENEMA 7-19 GM/118ML RE ENEM: 1.0000 | ENEMA | Freq: Every day | RECTAL | Status: DC | PRN

## 2013-07-03 MED ORDER — FENTANYL CITRATE 0.05 MG/ML IJ SOLN
INTRAMUSCULAR | Status: DC | PRN
  Administered 2013-07-03: 50 ug via INTRAVENOUS
  Administered 2013-07-03: 15 ug via INTRATHECAL
  Administered 2013-07-03: 35 ug via INTRAVENOUS

## 2013-07-03 MED ORDER — NALBUPHINE HCL 10 MG/ML IJ SOLN
5.0000 mg | INTRAMUSCULAR | Status: DC | PRN
  Administered 2013-07-03 (×2): 10 mg via INTRAVENOUS
  Filled 2013-07-03 (×2): qty 1

## 2013-07-03 MED ORDER — SIMETHICONE 80 MG PO CHEW
80.0000 mg | CHEWABLE_TABLET | Freq: Three times a day (TID) | ORAL | Status: DC
  Administered 2013-07-03 – 2013-07-06 (×5): 80 mg via ORAL
  Filled 2013-07-03 (×6): qty 1

## 2013-07-03 MED ORDER — SCOPOLAMINE 1 MG/3DAYS TD PT72
MEDICATED_PATCH | TRANSDERMAL | Status: AC
  Administered 2013-07-03: 1.5 mg via TRANSDERMAL
  Filled 2013-07-03: qty 1

## 2013-07-03 MED ORDER — OXYTOCIN 10 UNIT/ML IJ SOLN
INTRAMUSCULAR | Status: AC
  Filled 2013-07-03: qty 4

## 2013-07-03 MED ORDER — ONDANSETRON HCL 4 MG/2ML IJ SOLN
INTRAMUSCULAR | Status: AC
  Filled 2013-07-03: qty 2

## 2013-07-03 MED ORDER — METOCLOPRAMIDE HCL 5 MG/ML IJ SOLN: 10.0000 mg | Freq: Three times a day (TID) | INTRAMUSCULAR | Status: DC | PRN

## 2013-07-03 MED ORDER — PHENYLEPHRINE 8 MG IN D5W 100 ML (0.08MG/ML) PREMIX OPTIME
INJECTION | INTRAVENOUS | Status: DC | PRN
  Administered 2013-07-03: 60 ug/min via INTRAVENOUS

## 2013-07-03 MED ORDER — MEPERIDINE HCL 25 MG/ML IJ SOLN: 6.2500 mg | INTRAMUSCULAR | Status: DC | PRN

## 2013-07-03 MED ORDER — ONDANSETRON HCL 4 MG/2ML IJ SOLN: 4.0000 mg | Freq: Three times a day (TID) | INTRAMUSCULAR | Status: DC | PRN

## 2013-07-03 MED ORDER — DIPHENHYDRAMINE HCL 25 MG PO CAPS
25.0000 mg | ORAL_CAPSULE | ORAL | Status: DC | PRN
  Filled 2013-07-03: qty 1

## 2013-07-03 MED ORDER — ONDANSETRON HCL 4 MG/2ML IJ SOLN
INTRAMUSCULAR | Status: DC | PRN
  Administered 2013-07-03: 4 mg via INTRAVENOUS

## 2013-07-03 MED ORDER — OXYCODONE-ACETAMINOPHEN 5-325 MG PO TABS: 1.0000 | ORAL_TABLET | ORAL | Status: DC | PRN

## 2013-07-03 MED ORDER — DIPHENHYDRAMINE HCL 50 MG/ML IJ SOLN: 25.0000 mg | INTRAMUSCULAR | Status: DC | PRN

## 2013-07-03 MED ORDER — BISACODYL 10 MG RE SUPP: 10.0000 mg | Freq: Every day | RECTAL | Status: DC | PRN

## 2013-07-03 MED ORDER — DIPHENHYDRAMINE HCL 50 MG/ML IJ SOLN: 12.5000 mg | INTRAMUSCULAR | Status: DC | PRN

## 2013-07-03 MED ORDER — PHENYLEPHRINE 40 MCG/ML (10ML) SYRINGE FOR IV PUSH (FOR BLOOD PRESSURE SUPPORT)
PREFILLED_SYRINGE | INTRAVENOUS | Status: AC
  Filled 2013-07-03: qty 5

## 2013-07-03 MED ORDER — MEPERIDINE HCL 25 MG/ML IJ SOLN
INTRAMUSCULAR | Status: DC | PRN
  Administered 2013-07-03 (×2): 12.5 mg via INTRAVENOUS

## 2013-07-03 MED ORDER — MEPERIDINE HCL 25 MG/ML IJ SOLN
INTRAMUSCULAR | Status: AC
  Filled 2013-07-03: qty 1

## 2013-07-03 MED ORDER — ONDANSETRON HCL 4 MG PO TABS: 4.0000 mg | ORAL_TABLET | ORAL | Status: DC | PRN

## 2013-07-03 MED ORDER — NALOXONE HCL 0.4 MG/ML IJ SOLN: 0.4000 mg | INTRAMUSCULAR | Status: DC | PRN

## 2013-07-03 MED ORDER — ONDANSETRON HCL 4 MG/2ML IJ SOLN: 4.0000 mg | INTRAMUSCULAR | Status: DC | PRN

## 2013-07-03 MED ORDER — DIBUCAINE 1 % RE OINT: 1.0000 "application " | TOPICAL_OINTMENT | RECTAL | Status: DC | PRN

## 2013-07-03 MED ORDER — BUPIVACAINE IN DEXTROSE 0.75-8.25 % IT SOLN
INTRATHECAL | Status: DC | PRN
  Administered 2013-07-03: 1.4 mL via INTRATHECAL

## 2013-07-03 MED ORDER — SCOPOLAMINE 1 MG/3DAYS TD PT72
1.0000 | MEDICATED_PATCH | TRANSDERMAL | Status: DC
  Administered 2013-07-03: 1.5 mg via TRANSDERMAL

## 2013-07-03 MED ORDER — METOCLOPRAMIDE HCL 5 MG/ML IJ SOLN: 10.0000 mg | Freq: Once | INTRAMUSCULAR | Status: DC | PRN

## 2013-07-03 MED ORDER — ENOXAPARIN SODIUM 40 MG/0.4ML ~~LOC~~ SOLN
40.0000 mg | SUBCUTANEOUS | Status: DC
  Filled 2013-07-03: qty 0.4

## 2013-07-03 MED ORDER — WITCH HAZEL-GLYCERIN EX PADS: 1.0000 "application " | MEDICATED_PAD | CUTANEOUS | Status: DC | PRN

## 2013-07-03 MED ORDER — DEXTROSE 5 % IV SOLN
3.0000 g | INTRAVENOUS | Status: AC
  Administered 2013-07-03: 3 g via INTRAVENOUS
  Filled 2013-07-03: qty 3000

## 2013-07-03 MED ORDER — NALOXONE HCL 1 MG/ML IJ SOLN: 1.0000 ug/kg/h | INTRAMUSCULAR | Status: DC | PRN

## 2013-07-03 MED ORDER — MENTHOL 3 MG MT LOZG: 1.0000 | LOZENGE | OROMUCOSAL | Status: DC | PRN

## 2013-07-03 MED ORDER — ATROPINE SULFATE 0.4 MG/ML IJ SOLN
INTRAMUSCULAR | Status: DC | PRN
  Administered 2013-07-03: 0.2 mg via INTRAVENOUS

## 2013-07-03 MED ORDER — SIMETHICONE 80 MG PO CHEW
80.0000 mg | CHEWABLE_TABLET | ORAL | Status: DC
  Administered 2013-07-04 – 2013-07-06 (×3): 80 mg via ORAL
  Filled 2013-07-03 (×3): qty 1

## 2013-07-03 MED ORDER — MORPHINE SULFATE (PF) 0.5 MG/ML IJ SOLN
INTRAMUSCULAR | Status: DC | PRN
  Administered 2013-07-03: .1 mg via INTRATHECAL
  Administered 2013-07-03: 4.9 mg via INTRAVENOUS

## 2013-07-03 SURGICAL SUPPLY — 32 items
CLAMP CORD UMBIL (MISCELLANEOUS) IMPLANT
CLIP FILSHIE TUBAL LIGA STRL (Clip) ×3 IMPLANT
CLOTH BEACON ORANGE TIMEOUT ST (SAFETY) ×3 IMPLANT
DRAIN JACKSON PRT FLT 7MM (DRAIN) IMPLANT
DRAPE LG THREE QUARTER DISP (DRAPES) IMPLANT
DRESSING DISP NPWT PICO 4X12 (MISCELLANEOUS) ×3 IMPLANT
DRSG OPSITE POSTOP 4X10 (GAUZE/BANDAGES/DRESSINGS) ×3 IMPLANT
DURAPREP 26ML APPLICATOR (WOUND CARE) ×3 IMPLANT
ELECT REM PT RETURN 9FT ADLT (ELECTROSURGICAL) ×3
ELECTRODE REM PT RTRN 9FT ADLT (ELECTROSURGICAL) ×1 IMPLANT
EVACUATOR SILICONE 100CC (DRAIN) IMPLANT
EXTRACTOR VACUUM M CUP 4 TUBE (SUCTIONS) IMPLANT
EXTRACTOR VACUUM M CUP 4' TUBE (SUCTIONS)
GLOVE BIO SURGEON STRL SZ7 (GLOVE) ×3 IMPLANT
GLOVE BIOGEL PI IND STRL 7.0 (GLOVE) ×1 IMPLANT
GLOVE BIOGEL PI INDICATOR 7.0 (GLOVE) ×2
GOWN STRL REUS W/TWL LRG LVL3 (GOWN DISPOSABLE) ×6 IMPLANT
KIT ABG SYR 3ML LUER SLIP (SYRINGE) IMPLANT
NEEDLE HYPO 25X5/8 SAFETYGLIDE (NEEDLE) ×3 IMPLANT
NS IRRIG 1000ML POUR BTL (IV SOLUTION) ×3 IMPLANT
PACK C SECTION WH (CUSTOM PROCEDURE TRAY) ×3 IMPLANT
PAD OB MATERNITY 4.3X12.25 (PERSONAL CARE ITEMS) ×3 IMPLANT
RETAINER VISCERAL (MISCELLANEOUS) ×2 IMPLANT
RTRCTR C-SECT PINK 25CM LRG (MISCELLANEOUS) ×3 IMPLANT
SPONGE LAP 18X18 X RAY DECT (DISPOSABLE) ×6 IMPLANT
SUT PLAIN 2 0 XLH (SUTURE) ×4 IMPLANT
SUT VIC AB 0 CTX 36 (SUTURE) ×18
SUT VIC AB 0 CTX36XBRD ANBCTRL (SUTURE) ×5 IMPLANT
SUT VIC AB 4-0 KS 27 (SUTURE) ×3 IMPLANT
TOWEL OR 17X24 6PK STRL BLUE (TOWEL DISPOSABLE) ×3 IMPLANT
TRAY FOLEY CATH 14FR (SET/KITS/TRAYS/PACK) ×3 IMPLANT
WATER STERILE IRR 1000ML POUR (IV SOLUTION) ×3 IMPLANT

## 2013-07-03 NOTE — Anesthesia Postprocedure Evaluation (Signed)
  Anesthesia Post-op Note  Patient: Kathleen Andrews  Procedure(s) Performed: Procedure(s) with comments: REPEAT CESAREAN SECTION X 3 WITH BILATERAL TUBAL LIGATION (N/A) - Dr [name] request CSE anesthesia, also requested to be in room for taping/positioning of patient. 5/25 TWD  Patient Location: PACU and Mother/Baby  Anesthesia Type:Spinal and Epidural  Level of Consciousness: awake, alert  and oriented  Airway and Oxygen Therapy: Patient Spontanous Breathing  Post-op Pain: mild  Post-op Assessment: Post-op Vital signs reviewed and Patient's Cardiovascular Status Stable  Post-op Vital Signs: Reviewed and stable  Last Vitals:  Filed Vitals:   07/03/13 1526  BP: 124/59  Pulse: 80  Temp: 37.4 C  Resp: 16    Complications: No apparent anesthesia complications

## 2013-07-03 NOTE — OR Nursing (Signed)
OR delivery showed girl b called admitting spoke to [place]  had her fix delivery summary and print armbands to [facility] Placed all other labels in recycle bin

## 2013-07-03 NOTE — H&P (Signed)
Kathleen Andrews is a 28 y.o. female [phone] with IUP at [redacted]w[redacted]d presenting for repeat c-section (number 4). Pt states she has been having irregular contractions, without vaginal bleeding.  Membranes are intact, with active fetal movement.   [facility] 6 wks  Prenatal History/Complications: Morbid obesity - BMI 59 Multiple c-section hx -3 First child with congenital heart disease - normal echo Previous stillbirth Hx of tachycardia  Past Medical History: Past Medical History  Diagnosis Date  . Urinary tract bacterial infections   . OBESITY, NOS   . Urge urinary incontinence   . Recurrent UTI   . Axillary adenopathy   . Amenorrhea due to oral contraceptive   .  Supervision of high-risk pregnancy in first trimester   . Tachycardia   . Previous cesarean delivery, antepartum condition or complication   . Congenital heart disease, fetal, affecting care of mother, antepartum-in previous child   . Obesity complicating peripregnancy, antepartum   . History of IUGR (intrauterine growth retardation) and stillbirth, currently pregnant   . Pregnancy complicated by previous recurrent miscarriages   . Dysrhythmia     pregnancy related irregular and rapid heart rate, has followed up with cardiologist  . Shortness of breath   . Anxiety   . Depression   . Chronic kidney disease     history of kidney stones  . GERD (gastroesophageal reflux disease)     with pregnancy  . Headache(784.0)     migraines  . Cancer     ankle bone area removed  . Anemia     history after pregnancy  . Complication of anesthesia     low blood pressure with general anesthesia    Past Surgical History: Past Surgical History  Procedure Laterality Date  . Ankle surgery    . Cesarean section      X 2 - failure to progress and repeat elective    Obstetrical History: OB History   Grav Para Term Preterm Abortions TAB SAB Ect Mult Living   10 3 3  0 6 1 5  0 0 2      Gynecological History: OB History   Grav Para Term Preterm Abortions TAB SAB Ect Mult Living   10 3 3  0 6 1 5  0 0 2      Social History: History   Social History  . Marital Status: Single    Spouse Name: N/A    Number of Children: N/A  . Years of Education: N/A   Social History Main Topics  . Smoking status: Never Smoker   . Smokeless tobacco: Never Used  . Alcohol Use: No  . Drug Use: No  . Sexual Activity: Yes   Other Topics Concern  . None   Social History Narrative   [employer] - Passenger transport manager          Family History: Family History  Problem Relation Age of Onset  . Cancer Other   . Hypertension Other   . Hyperlipidemia Other   . Blindness Other   . Deafness Other   . Diabetes Father   . Heart disease Other     mother adopted but reported strong history    Allergies: Allergies  Allergen Reactions  . Tomato     Hives  . Eggs Or Egg-Derived Products Hives and Rash  . Latex Hives and Rash    "MAKES FLESH RAW"    Prescriptions prior to admission  Medication Sig Dispense Refill  . acetaminophen (TYLENOL) 650 MG CR  tablet Take 650 mg by mouth every 8 (eight) hours as needed for pain.      . calcium carbonate (TUMS - DOSED IN MG ELEMENTAL CALCIUM) 500 MG chewable tablet Chew 1 tablet by mouth 3 (three) times daily as needed for indigestion or heartburn.         Review of Systems   Constitutional: no complaints  Blood pressure 136/98, pulse 89, temperature 98.1 F (36.7 C), temperature source Oral, resp. rate 20, last menstrual period 09/25/2012, SpO2 98.00%. General appearance: alert, cooperative, appears stated age, no distress and morbidly obese Lungs: clear to auscultation bilaterally Heart: regular rate and rhythm Abdomen: soft, non-tender; bowel sounds normal, lower abdominal scar,  Pelvic: not examined Extremities: Homans sign is negative, no sign of DVT  Presentation: cephalic Fetal monitoringBaseline: 132 bpm      Prenatal labs: ABO, Rh: --/--/O POS (05/26  2694) Antibody: PENDING (05/26 0738) Rubella:   RPR: NON REAC (05/21 0957)  HBsAg: NEGATIVE (09/30 1035)  HIV: NON REACTIVE (09/30 1035)  GBS:      Super morbid obesity Recurrent abortions History of congenital heart disease in the first postterm delivery History of stillbirth Familial history of neural tube defects, cystic fibrosis, muscular dystrophy in  maternal grandmother & mgm side of the family  Clinic  [facility]  Dating LMP/Ultrasound:  5  weeks        Ultrasound consistent with LMP: No  Genetic Screen 1 Screen:                 AFP:                    Quad:                  NIPS:  Anatomic [place]  normal but needs follow up  GTT Early:  declines             Third trimester:   TDaP vaccine   Flu vaccine declines  GBS   Baby Food   Contraception  BTL  Circumcision   Pediatrician        Prenatal Transfer Tool  Maternal Diabetes: declined Genetic Screening: not performed Maternal Ultrasounds/Referrals: Normal Fetal Ultrasounds or other Referrals:  Fetal echo Maternal Substance Abuse:  No Significant Maternal Medications:  None Significant Maternal Lab Results: None  Super morbid obesity Recurrent abortions History of congenital heart disease in the first postterm delivery History of stillbirth Familial history of neural tube defects, cystic fibrosis, muscular dystrophy in  maternal grandmother & mgm side of the family  Clinic  [place]  Dating LMP/Ultrasound:  5  weeks        Ultrasound consistent with LMP: No  Genetic Screen 1 Screen:                 AFP:                    Quad:                  NIPS:  Anatomic [place]  normal but needs follow up  GTT Early:  declines             Third trimester:   TDaP vaccine   Flu vaccine declines  GBS   Baby Food   Contraception  BTL  Circumcision   Pediatrician     Hx of noncompliance   Results for orders placed during the hospital encounter of 07/03/13 (from the past 24 hour(s))  TYPE AND SCREEN   Collection Time     07/03/13  7:38 AM      Result Value Ref Range   ABO/RH(D) O POS     Antibody Screen PENDING     Sample Expiration 07/06/2013      Assessment: Kathleen Andrews is a 28 y.o. [id number] at [redacted]w[redacted]d  here for repeat c-section #Labor: will move to repeat c-section, incision apears appropriate for phaniestel #Pain: spinal #FWB: Stable HR #ID:  Ancef 3g prior to incision #MOC: BTL  The risks of cesarean section were discussed with the patient including but were not limited to: bleeding which may require transfusion or reoperation; infection which may require antibiotics; injury to bowel, bladder, ureters or other surrounding organs; injury to the fetus; need for additional procedures including hysterectomy in the event of a life-threatening hemorrhage; placental abnormalities wth subsequent pregnancies, incisional problems, thromboembolic phenomenon and other postoperative/anesthesia complications. Patient also desires permanent sterilization.  Other reversible forms of contraception were discussed with patient; she declines all other modalities. Risks of procedure discussed with patient including but not limited to: risk of regret, permanence of method, bleeding, infection, injury to surrounding organs and need for additional procedures.  Failure risk of 1-2% with increased risk of ectopic gestation if pregnancy occurs was also discussed with patient.  The patient concurred with the proposed plan, giving informed written consent for the procedures.  Patient has been NPO since last night she will remain NPO for procedure. Anesthesia and OR aware.  Preoperative prophylactic antibiotics and SCDs ordered on call to the OR.  To OR when ready.     Allen Norris 07/03/2013, 8:56 AM   Pt seen and examined. Agree with above note.    [name]

## 2013-07-03 NOTE — Lactation Note (Signed)
This note was copied from the chart of Kathleen Andrews. Lactation Consultation Note  Patient Name: Kathleen Andrews [id number] Date: 07/03/2013 Reason for consult: Initial assessment of this multigravida with her older child now almost 30 yo.  Her first child was born with cardiac anomaly and she has hx of multiple fetal losses and a stillbirth, as well as morbid obesity.  Mom states that she breastfed her almost 28 yo for 4 months but never made sufficient milk for exclusive breastfeeding.  Mom states she was given fenugreek and reglan and pumped for additional stimulation but never obtained more than 3 oz in 24 hours w/pump.  This newborn is latching well thus far and mom is able to place baby STS which was not available when her last baby was born.  LC encouraged frequent STS which can help boost mom's hormones for milk production.  LC also encouraged cue feedings and reviewed chart in Baby and Me (page 24 ) which has guidelines for output based on day of life. LC encouraged review of Baby and Me pp 9, 14 and 20-25 for STS and BF information. LC provided [employer] brochure and reviewed [facility] services and list of community and web site resources.     Maternal Data Formula Feeding for Exclusion: No Infant to breast within first hour of birth: No (baby able to breastfeed at 52 minutes post c/s delivery) Breastfeeding delayed due to:: Maternal status Has patient been taught Hand Expression?: Yes (experienced mom who both nursed and pumped for her older child, almost 30 yo) Does the patient have breastfeeding experience prior to this delivery?: Yes  Feeding Feeding Type: Breast Fed Length of feed: 15 min  LATCH Score/Interventions         LATCH score=8 as assessed by RN             Lactation Tools Discussed/Used   STS, cue feedings, signs of sufficient milk intake based on baby's output  Consult Status Consult Status: Follow-up Date: 07/04/13 Follow-up type: In-patient    Landis Gandy 07/03/2013, 8:06 PM

## 2013-07-03 NOTE — Anesthesia Procedure Notes (Signed)
Spinal  Patient location during procedure: OR Start time: 07/03/2013 10:54 AM Staffing Performed by: anesthesiologist  Preanesthetic Checklist Completed: patient identified, site marked, surgical consent, pre-op evaluation, timeout performed, IV checked, risks and benefits discussed and monitors and equipment checked Spinal Block Patient position: sitting Prep: site prepped and draped and DuraPrep Patient monitoring: heart rate, cardiac monitor, continuous pulse ox and blood pressure Approach: midline Location: L3-4 Injection technique: catheter Needle Needle type: Pencan and Tuohy  Needle gauge: 24 G Needle length: 9 cm Needle insertion depth: 10 cm Catheter type: closed end flexible Catheter size: 19 g Catheter at skin depth: 16 cm Assessment Sensory level: T4 Additional Notes CSE for morbid obesity and h/o C/S x3.  Tuohy with LOR to air at 10 cm at skin.  Pencan passed via tuohy with immediate return of clear free flow CSF.  SAB dose given.  Pencan removed, tuohy flushed with 2 ml saline.  Epidural catheter passed with mild resistance.  Pt uncomfortable throughout, but did not voice specific paresthesia.  Epidural catheter secured at 16 cm at skin.  EPIDURAL CATHETER NOT TESTED.  Pt to supine for surgery with LUD.  Charlton Haws, MD

## 2013-07-03 NOTE — Op Note (Signed)
[name] PROCEDURE DATE: 07/03/2013  PREOPERATIVE DIAGNOSES: Intrauterine pregnancy at  [redacted]w[redacted]d weeks gestation; previous uterine incision kerr x3 or greater; undesired fertility  POSTOPERATIVE DIAGNOSES: The same  PROCEDURE: Repeat Low Transverse Cesarean Section, Bilateral Tubal Sterilization using Filshie clips  SURGEON:  Dr. Silas Sacramento  ASSISTANT:  Dr. Leamon Arnt  INDICATIONS: Kathleen Andrews is a 28 y.o. [id number] at [redacted]w[redacted]d here for cesarean section and bilateral tubal sterilization secondary to the indications listed under preoperative diagnoses; please see preoperative note for further details.  The risks of surgery were discussed with the patient including but were not limited to: bleeding which may require transfusion or reoperation; infection which may require antibiotics; injury to bowel, bladder, ureters or other surrounding organs; injury to the fetus; need for additional procedures including hysterectomy in the event of a life-threatening hemorrhage; placental abnormalities wth subsequent pregnancies, incisional problems, thromboembolic phenomenon and other postoperative/anesthesia complications.  Patient also desires permanent sterilization.  Other reversible forms of contraception were discussed with patient; she declines all other modalities. Risks of procedure discussed with patient including but not limited to: risk of regret, permanence of method, bleeding, infection, injury to surrounding organs and need for additional procedures.  Failure risk of 0.5-1% with increased risk of ectopic gestation if pregnancy occurs was also discussed with patient.  The patient concurred with the proposed plan, giving informed written consent for the procedures.    FINDINGS:  Viable female infant in cephalic presentation.  Apgars 8 and 9.  Clear amniotic fluid.  Intact placenta, three vessel cord.  Normal uterus, fallopian tubes and ovaries bilaterally.  ANESTHESIA: Epidural INTRAVENOUS  FLUIDS: 1500 ml ESTIMATED BLOOD LOSS: 900 ml URINE OUTPUT:  100 ml SPECIMENS: Placenta sent to L&D COMPLICATIONS: None immediate  PROCEDURE IN DETAIL:  The patient preoperatively received intravenous antibiotics and had sequential compression devices applied to her lower extremities.   She was then taken to the operating room where the epidural anesthesia was dosed up to surgical level and was found to be adequate. She was then placed in a dorsal supine position with a leftward tilt, and prepped and draped in a sterile manner.  A foley catheter was placed into her bladder and attached to constant gravity.  After an adequate timeout was performed, pt abdomen taped to maximize exposure, a Pfannenstiel skin incision was made through prior incision with scalpel and carried through to the underlying layer of fascia. The fascia was incised in the midline, and this incision was extended bilaterally using the Mayo scissors.  Kocher clamps were applied to the superior aspect of the fascial incision and the underlying rectus muscles were dissected off bluntly. A similar process was carried out on the inferior aspect of the fascial incision. The rectus muscles were separated in the midline bluntly and the peritoneum was entered bluntly. Omentum was across the peirtoneum and was sutured and ligated. Attention was turned to the lower uterine segment where a bladder flap was created, then a low transverse hysterotomy was made with a scalpel and extended bilaterally bluntly.  The infant was successfully delivered, the cord was clamped and cut and the infant was handed over to awaiting neonatology team. Uterine massage was then administered, and the placenta delivered intact with a three-vessel cord. The uterus was then cleared of clot and debris.  The hysterotomy was closed with 0 Vicryl in a running locked fashion, and required several stitches for hemostasis.  Attention was then turned to the fallopian tubes.  A Filshie  clip was  placed on both tubes, about 3 cm from the cornua, with care given to incorporate the underlying mesosalpinx on both sides, allowing for bilateral tubal sterilization. The pelvis was cleared of all clot and debris. Hemostasis was confirmed on all surfaces.   The fascia was then closed using 0 Vicryl in a running fashion.  The subcutaneous layer was irrigated, then reapproximated with 2-0 plain gut interrupted stitches.  The skin was closed with staples and covered with a wound vac. The patient tolerated the procedure well. Sponge, lap, instrument and needle counts were correct x 2.  She was taken to the recovery room in stable condition.

## 2013-07-03 NOTE — Transfer of Care (Signed)
Immediate Anesthesia Transfer of Care Note  Patient: Kathleen Andrews  Procedure(s) Performed: Procedure(s) with comments: REPEAT CESAREAN SECTION X 3 WITH BILATERAL TUBAL LIGATION (N/A) - Dr [name] request CSE anesthesia, also requested to be in room for taping/positioning of patient. 5/25 TWD  Patient Location: PACU  Anesthesia Type:Spinal  Level of Consciousness: awake, alert , oriented and patient cooperative  Airway & Oxygen Therapy: Patient Spontanous Breathing  Post-op Assessment: Report given to PACU RN and Post -op Vital signs reviewed and stable  Post vital signs: Reviewed and stable  Complications: No apparent anesthesia complications

## 2013-07-03 NOTE — Anesthesia Postprocedure Evaluation (Signed)
Anesthesia Post Note  Patient: Kathleen Andrews  Procedure(s) Performed: Procedure(s) (LRB): REPEAT CESAREAN SECTION X 3 WITH BILATERAL TUBAL LIGATION (N/A)  Anesthesia type: Spinal  Patient location: PACU  Post pain: Pain level controlled  Post assessment: Post-op Vital signs reviewed  Last Vitals:  Filed Vitals:   07/03/13 1415  BP: 111/67  Pulse: 68  Temp:   Resp: 17    Post vital signs: Reviewed  Level of consciousness: awake  Complications: No apparent anesthesia complications

## 2013-07-03 NOTE — Addendum Note (Signed)
Addendum created 07/03/13 1901 by Flossie Dibble, CRNA   Modules edited: Notes Section   Notes Section:  File: [id number]

## 2013-07-04 ENCOUNTER — Encounter (HOSPITAL_COMMUNITY): Payer: Self-pay | Admitting: *Deleted

## 2013-07-04 LAB — CBC
HCT: 29.9 % — ABNORMAL LOW (ref 36.0–46.0)
HEMOGLOBIN: 9.5 g/dL — AB (ref 12.0–15.0)
MCH: 24.7 pg — ABNORMAL LOW (ref 26.0–34.0)
MCHC: 31.8 g/dL (ref 30.0–36.0)
MCV: 77.7 fL — AB (ref 78.0–100.0)
Platelets: 146 10*3/uL — ABNORMAL LOW (ref 150–400)
RBC: 3.85 MIL/uL — ABNORMAL LOW (ref 3.87–5.11)
RDW: 15.4 % (ref 11.5–15.5)
WBC: 10.9 10*3/uL — AB (ref 4.0–10.5)

## 2013-07-04 SURGERY — Surgical Case
Anesthesia: *Unknown

## 2013-07-04 MED ORDER — ENOXAPARIN SODIUM 100 MG/ML ~~LOC~~ SOLN
90.0000 mg | SUBCUTANEOUS | Status: DC
  Filled 2013-07-04 (×3): qty 1

## 2013-07-04 NOTE — Progress Notes (Signed)
Patient states she would like to walk to the bathroom.  She states "I know I need to get up and move around".  She ambulated to the bathroom, was assisted with peri care and ambulated back to the bed.  Tolerated well.

## 2013-07-04 NOTE — Progress Notes (Signed)
Pt c/o gas pains. Refused heating pad and warm liquids. Encouraged pt to ambulate.

## 2013-07-04 NOTE — Op Note (Signed)
Present for entire procedure. Pt wanted a subcuticular closure.  Given scarring of prior three c/s and fold of panus, a SQ stitch was not approximating edges well.  Staples were a better, safer option for skin closure.  Pt informed of reason for staples.  Attestation of Attending Supervision of Fellow: Evaluation and management procedures were performed by the Fellow under my supervision and collaboration. I have reviewed the Fellow's note and chart, and I agree with the management and plan.

## 2013-07-04 NOTE — Progress Notes (Signed)
Patient refused Lovenox injection.  States she "doesn't do injections". Sent medication back to pharmacy.

## 2013-07-04 NOTE — Progress Notes (Signed)
Reminded patient to keep SCD's on while she was in the bed.  Stated she was "getting hot", and was giving herself a break. She verbalized understanding.

## 2013-07-04 NOTE — Progress Notes (Signed)
Patient states that she is thankful for my "friendliness" towards her.  Stated that she "had it out" with PACU staff member.  This was not reported to me by PACU, RN who transported the patient.  Patient also verbalized a request for no visitors at this time, "I don't want the father of the baby's mother here.  She doesn't even know my name after six years". She repeated this several times.  I stated that I could call the security desk for her and request no visitors, but then patient said it would be fine for her to come.  She has no other questions or concerns at this time.
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
## 2013-07-04 NOTE — Lactation Note (Signed)
This note was copied from the chart of Kathleen Andrews. Lactation Consultation Note Follow up consult: Mother has history of low milk supply.  Reviewed hand expression, drops of colostrum expressed.  Mother pleased. Assisted mother in placing baby in cross cradle hold.  Sucks and swallows observed. Encouraged STS, hand expression and to call if further assistance is needed. Discussed possiblity of DEBP later if baby seems to not be satisfied at the breast or diminished voids/stools. Mother open to pumping at a later time.  Patient Name: Kathleen Andrews [id number] Date: 07/04/2013     Maternal Data    Feeding Feeding Type: Breast Fed Length of feed: 15 min  LATCH Score/Interventions Latch: Grasps breast easily, tongue down, lips flanged, rhythmical sucking.  Audible Swallowing: Spontaneous and intermittent Intervention(s): Hand expression  Type of Nipple: Everted at rest and after stimulation  Comfort (Breast/Nipple): Soft / non-tender     Hold (Positioning): Assistance needed to correctly position infant at breast and maintain latch.  LATCH Score: 9  Lactation Tools Discussed/Used     Consult Status Consult Status: Follow-up Date: 07/05/13 Follow-up type: In-patient    Larry Sierras Berkelhammer 07/04/2013, 3:46 PM

## 2013-07-04 NOTE — Progress Notes (Signed)
Subjective: Postoperative Day 1: Cesarean Delivery Patient reports tolerating PO, + flatus and no problems voiding.    Objective: Vital signs in last 24 hours: Temp:  [98 F (36.7 C)-99.3 F (37.4 C)] 98.5 F (36.9 C) (05/27 0438) Pulse Rate:  [65-101] 84 (05/27 0438) Resp:  [13-23] 18 (05/27 0438) BP: (97-124)/(44-80) 104/66 mmHg (05/27 0438) SpO2:  [97 %-100 %] 99 % (05/27 0438) Weight:  [176.177 kg (388 lb 6.4 oz)] 176.177 kg (388 lb 6.4 oz) (05/26 1345)  Physical Exam:  General: alert, cooperative and no distress Lochia: appropriate Uterine Fundus: firm Incision: no significant drainage, wound vac in place.  DVT Evaluation: No evidence of DVT seen on physical exam.   Recent Labs  07/03/13 1625 07/04/13 0736  HGB 10.3* 9.5*  HCT 32.2* 29.9*    Assessment/Plan: Status post Cesarean section. Doing well postoperatively.  Continue current care. MOC: BTL  MOF: breast  Patient refused lovenox this am. Was not wearing SCD's upon entering room or during exam  Plan for discharge tomorrow.   Rosemarie Ax 07/04/2013, 7:50 AM  I have seen and examined this patient and agree the above assessment. Joaquim Lai Cresenzo-Dishmon 07/04/2013 8:09 AM

## 2013-07-05 ENCOUNTER — Encounter (HOSPITAL_COMMUNITY): Payer: Self-pay | Admitting: *Deleted

## 2013-07-05 MED ORDER — OXYCODONE-ACETAMINOPHEN 5-325 MG PO TABS: 1.0000 | ORAL_TABLET | ORAL | Status: DC | PRN

## 2013-07-05 NOTE — Discharge Summary (Signed)
Obstetric Discharge Summary Reason for Admission: cesarean section repeat (number 4)  Prenatal Procedures: none Intrapartum Procedures: cesarean: low cervical, transverse Postpartum Procedures: none Complications-Operative and Postpartum: none Hemoglobin  Date Value Ref Range Status  07/04/2013 9.5* 12.0 - 15.0 g/dL Final     HCT  Date Value Ref Range Status  07/04/2013 29.9* 36.0 - 46.0 % Final   Kathleen Andrews is a 28 y.o. female [id number] with IUP at [redacted]w[redacted]d presenting for repeat c-section (number 4). Viable female infant in cephalic presentation. Apgars 8 and 9. Clear amniotic fluid. Intact placenta, three vessel cord. Normal uterus, fallopian tubes and ovaries bilaterally. She will breast feed and use nexplanon for contraception.   Physical Exam:  General: alert, cooperative and no distress Lochia: appropriate Uterine Fundus: firm Incision: no significant drainage DVT Evaluation: No evidence of DVT seen on physical exam.  Discharge Diagnoses: Term Pregnancy-delivered  Discharge Information: Date: 07/05/2013 Activity: restricted for first 2 weeks post op Diet: routine Medications: Percocet Condition: stable Instructions: refer to practice specific booklet Discharge to: home Follow-up Information   Follow up with [facility]. Schedule an appointment as soon as possible for a visit in 1 week.   Contact information:   [place] [address] [phone]      Newborn Data:   Runell Gess Girl [place] [[id number]]  Live born female  Birth Weight:  APGAR: 12, [place], Girl [place] [[id number]]  Live born female  Birth Weight: 9 lb 12.6 oz (4440 g) APGAR: 8, 9  Home with mother.  Rosemarie Ax 07/05/2013, 7:44 AM  I have seen and examined this patient and I agree with the above. Myrtis Ser CNM 8:59 AM 07/05/2013

## 2013-07-05 NOTE — Progress Notes (Signed)
Cancelled discharge order per Dr. Mariam Dollar.

## 2013-07-05 NOTE — Discharge Instructions (Signed)
Postpartum Care After Cesarean Delivery °After you deliver your newborn (postpartum period), the usual stay in the hospital is 24 72 hours. If there were problems with your labor or delivery, or if you have other medical problems, you might be in the hospital longer.  °While you are in the hospital, you will receive help and instructions on how to care for yourself and your newborn during the postpartum period.  °While you are in the hospital: °· It is normal for you to have pain or discomfort from the incision in your abdomen. Be sure to tell your nurses when you are having pain, where the pain is located, and what makes the pain worse. °· If you are breastfeeding, you may feel uncomfortable contractions of your uterus for a couple of weeks. This is normal. The contractions help your uterus get back to normal size. °· It is normal to have some bleeding after delivery. °· For the first 1 3 days after delivery, the flow is red and the amount may be similar to a period. °· It is common for the flow to start and stop. °· In the first few days, you may pass some small clots. Let your nurses know if you begin to pass large clots or your flow increases. °· Do not  flush blood clots down the toilet before having the nurse look at them. °· During the next 3 10 days after delivery, your flow should become more watery and pink or brown-tinged in color. °· Ten to fourteen days after delivery, your flow should be a small amount of yellowish-white discharge. °· The amount of your flow will decrease over the first few weeks after delivery. Your flow may stop in 6 8 weeks. Most women have had their flow stop by 12 weeks after delivery. °· You should change your sanitary pads frequently. °· Wash your hands thoroughly with soap and water for at least 20 seconds after changing pads, using the toilet, or before holding or feeding your newborn. °· Your intravenous (IV) tubing will be removed when you are drinking enough fluids. °· The  urine drainage tube (urinary catheter) that was inserted before delivery may be removed within 6 8 hours after delivery or when feeling returns to your legs. You should feel like you need to empty your bladder within the first 6 8 hours after the catheter has been removed. °· In case you become weak, lightheaded, or faint, call your nurse before you get out of bed for the first time and before you take a shower for the first time. °· Within the first few days after delivery, your breasts may begin to feel tender and full. This is called engorgement. Breast tenderness usually goes away within 48 72 hours after engorgement occurs. You may also notice milk leaking from your breasts. If you are not breastfeeding, do not stimulate your breasts. Breast stimulation can make your breasts produce more milk. °· Spending as much time as possible with your newborn is very important. During this time, you and your newborn can feel close and get to know each other. Having your newborn stay in your room (rooming in) will help to strengthen the bond with your newborn. It will give you time to get to know your newborn and become comfortable caring for your newborn. °· Your hormones change after delivery. Sometimes the hormone changes can temporarily cause you to feel sad or tearful. These feelings should not last more than a few days. If these feelings last longer   than that, you should talk to your caregiver. °· If desired, talk to your caregiver about methods of family planning or contraception. °· Talk to your caregiver about immunizations. Your caregiver may want you to have the following immunizations before leaving the hospital: °· Tetanus, diphtheria, and pertussis (Tdap) or tetanus and diphtheria (Td) immunization. It is very important that you and your family (including grandparents) or others caring for your newborn are up-to-date with the Tdap or Td immunizations. The Tdap or Td immunization can help protect your newborn  from getting ill. °· Rubella immunization. °· Varicella (chickenpox) immunization. °· Influenza immunization. You should receive this annual immunization if you did not receive the immunization during your pregnancy. °Document Released: 10/20/2011 Document Reviewed: 10/20/2011 °[employer]® Patient Information ©2014 [employer]. ° °

## 2013-07-06 MED ORDER — DIAZEPAM 5 MG PO TABS: 5.0000 mg | ORAL_TABLET | Freq: Once | ORAL | Status: DC

## 2013-07-06 NOTE — Discharge Summary (Signed)
  Patient was not discharged on the previous day due to complications with the baby. The patient's status has remained unchanged and appropriate for discharge.   Obstetric Discharge Summary  Reason for Admission: cesarean section repeat (number 4)  Prenatal Procedures: none  Intrapartum Procedures: cesarean: low cervical, transverse  Postpartum Procedures: none  Complications-Operative and Postpartum: PICO wound vac placed  Hemoglobin   Date  Value  Ref Range  Status   07/04/2013  9.5*  12.0 - 15.0 g/dL  Final      HCT   Date  Value  Ref Range  Status   07/04/2013  29.9*  36.0 - 46.0 %  Final    Kathleen Andrews is a 28 y.o. female [id number] with IUP at [redacted]w[redacted]d presenting for repeat c-section (number 4). Viable female infant in cephalic presentation. Apgars 8 and 9. Clear amniotic fluid. Intact placenta, three vessel cord. Normal uterus, fallopian tubes and ovaries bilaterally. She will breast feed and use nexplanon for contraception.   Physical Exam:  General: alert, cooperative and no distress  Lochia: appropriate  Uterine Fundus: firm  Incision: no significant drainage  DVT Evaluation: No evidence of DVT seen on physical exam.   Discharge Diagnoses: Term Pregnancy-delivered   Discharge Information:  Date: 07/05/2013  Activity: restricted for first 2 weeks post op  Diet: routine  Medications: Percocet  Condition: stable  Instructions: refer to practice specific booklet  Discharge to: home   Follow-up Information    Follow up with [facility]. Schedule an appointment as soon as possible for a visit in 1 week.    Contact information:    [place] [address]  [phone]      Newborn Data:    Runell Gess Girl [place] [[id number]]   Live born female  Birth Weight:  APGAR: 44, [place], Girl Danyela [[id number]]   Live born female  Birth Weight: 9 lb 12.6 oz (4440 g)  APGAR: 8, 9  Home with mother.  Clearance Coots  07/06/2013 8:09 AM   I have  seen and examined this patient and agree with above documentation in the resident's note. Pt with staples and a PICO wound vac. She has a f/u appt on Wednesday for staple removal and wound vac removal. rx given for valium oto to premedicate due to severe anxiety.    Ebbie Latus, M.D. [facility] 07/06/2013 9:59 AM

## 2013-07-07 ENCOUNTER — Inpatient Hospital Stay (HOSPITAL_COMMUNITY)
Admission: AD | Admit: 2013-07-07 | Discharge: 2013-07-07 | Disposition: A | Discharge status: Home / Self Care | Payer: Managed Care, Other (non HMO) | Source: Ambulatory Visit | Attending: Obstetrics and Gynecology | Admitting: Obstetrics and Gynecology

## 2013-07-07 ENCOUNTER — Encounter (HOSPITAL_COMMUNITY): Payer: Self-pay

## 2013-07-07 DIAGNOSIS — R209 Unspecified disturbances of skin sensation: Secondary | ICD-10-CM | POA: Insufficient documentation

## 2013-07-07 DIAGNOSIS — IMO0002 Reserved for concepts with insufficient information to code with codable children: Secondary | ICD-10-CM | POA: Insufficient documentation

## 2013-07-07 DIAGNOSIS — M7989 Other specified soft tissue disorders: Secondary | ICD-10-CM

## 2013-07-07 DIAGNOSIS — O99215 Obesity complicating the puerperium: Secondary | ICD-10-CM

## 2013-07-07 DIAGNOSIS — E669 Obesity, unspecified: Secondary | ICD-10-CM | POA: Insufficient documentation

## 2013-07-07 DIAGNOSIS — K219 Gastro-esophageal reflux disease without esophagitis: Secondary | ICD-10-CM | POA: Insufficient documentation

## 2013-07-07 DIAGNOSIS — Z85830 Personal history of malignant neoplasm of bone: Secondary | ICD-10-CM | POA: Insufficient documentation

## 2013-07-07 LAB — TYPE AND SCREEN
ABO/RH(D): O POS
Antibody Screen: NEGATIVE
UNIT DIVISION: 0
UNIT DIVISION: 0
Unit division: 0

## 2013-07-07 MED ORDER — ENOXAPARIN SODIUM 100 MG/ML ~~LOC~~ SOLN
1.0000 mg/kg | Freq: Once | SUBCUTANEOUS | Status: AC
  Administered 2013-07-07: 175 mg via SUBCUTANEOUS
  Filled 2013-07-07: qty 2

## 2013-07-07 MED ORDER — ENOXAPARIN SODIUM 150 MG/ML ~~LOC~~ SOLN: 1.0000 mg/kg | Freq: Two times a day (BID) | SUBCUTANEOUS | Status: DC

## 2013-07-07 NOTE — MAU Note (Signed)
Had repeat c/section on 5/26. BLE swelling, left side more so than right side. Also reports numbness to left leg. Denies fever/chest pain/SOB/palpitations. Reports incisional pain & feels like dressing is hanging off somewhat; has wound vac.

## 2013-07-07 NOTE — Discharge Instructions (Signed)
Your swelling may be related to a clot in your left leg. We gave you a dose of lovenox tonight to help treat this clot. We will send you home with lovenox to treat a potential clot. You should take this until you receive a call stating that there is no clot. You will need to report to the [facility] Emergency Department Admission window tomorrow morning between 8-8:30 am. You will need to tell the receptionist that you are to have a study in the vascular lab. The emergency room will then notify the person that will do the study.  Deep Vein Thrombosis A deep vein thrombosis (DVT) is a blood clot that develops in the deep, larger veins of the leg, arm, or pelvis. These are more dangerous than clots that might form in veins near the surface of the body. A DVT can lead to complications if the clot breaks off and travels in the bloodstream to the lungs.  A DVT can damage the valves in your leg veins, so that instead of flowing upward, the blood pools in the lower leg. This is called post-thrombotic syndrome, and it can result in pain, swelling, discoloration, and sores on the leg. CAUSES Usually, several things contribute to blood clots forming. Contributing factors include:  The flow of blood slows down.  The inside of the vein is damaged in some way.  You have a condition that makes blood clot more easily. RISK FACTORS Some people are more likely than others to develop blood clots. Risk factors include:   Older age, especially over 65 years of age.  Having a family history of blood clots or if you have already had a blot clot.  Having major or lengthy surgery. This is especially true for surgery on the hip, knee, or belly (abdomen). Hip surgery is particularly high risk.  Breaking a hip or leg.  Sitting or lying still for a long time. This includes long-distance travel, paralysis, or recovery from an illness or surgery.  Having cancer or cancer treatment.  Having a long, thin tube  (catheter) placed inside a vein during a medical procedure.  Being overweight (obese).  Pregnancy and childbirth.  Hormone changes make the blood clot more easily during pregnancy.  The fetus puts pressure on the veins of the pelvis.  There is a risk of injury to veins during delivery or a caesarean. The risk is highest just after childbirth.  Medicines with the female hormone estrogen. This includes birth control pills and hormone replacement therapy.  Smoking.  Other circulation or heart problems.  SIGNS AND SYMPTOMS When a clot forms, it can either partially or totally block the blood flow in that vein. Symptoms of a DVT can include:  Swelling of the leg or arm, especially if one side is much worse.  Warmth and redness of the leg or arm, especially if one side is much worse.  Pain in an arm or leg. If the clot is in the leg, symptoms may be more noticeable or worse when standing or walking. The symptoms of a DVT that has traveled to the lungs (pulmonary embolism, PE) usually start suddenly and include:  Shortness of breath.  Coughing.  Coughing up blood or blood-tinged phlegm.  Chest pain. The chest pain is often worse with deep breaths.  Rapid heartbeat. Anyone with these symptoms should get emergency medical treatment right away. Call your local emergency services (911 in the [place] if you have these symptoms. DIAGNOSIS If a DVT is suspected, your health  care provider will take a full medical history and perform a physical exam. Tests that also may be required include:  Blood tests, including studies of the clotting properties of the blood.  Ultrasonography to see if you have clots in your legs or lungs.  X-rays to show the flow of blood when dye is injected into the veins (venography).  Studies of your lungs if you have any chest symptoms. PREVENTION  Exercise the legs regularly. Take a brisk 30-minute walk every day.  Maintain a weight that is appropriate for  your height.  Avoid sitting or lying in bed for long periods of time without moving your legs.  Women, particularly those over the age of 17 years, should consider the risks and benefits of taking estrogen medicines, including birth control pills.  Do not smoke, especially if you take estrogen medicines.  Long-distance travel can increase your risk of DVT. You should exercise your legs by walking or pumping the muscles every hour.  In-hospital prevention:  Many of the risk factors above relate to situations that exist with hospitalization, either for illness, injury, or elective surgery.  Your health care provider will assess you for the need for venous thromboembolism prophylaxis when you are admitted to the hospital. If you are having surgery, your surgeon will assess you the day of or day after surgery.  Prevention may include medical and nonmedical measures. TREATMENT Once identified, a DVT can be treated. It can also be prevented in some circumstances. Once you have had a DVT, you may be at increased risk for a DVT in the future. The most common treatment for DVT is blood thinning (anticoagulant) medicine, which reduces the blood's tendency to clot. Anticoagulants can stop new blood clots from forming and stop old ones from growing. They cannot dissolve existing clots. Your body does this by itself over time. Anticoagulants can be given by mouth, by IV access, or by injection. Your health care provider will determine the best program for you. Other medicines or treatments that may be used are:  Heparin or related medicines (low molecular weight heparin) are usually the first treatment for a blood clot. They act quickly. However, they cannot be taken orally.  Heparin can cause a fall in a component of blood that stops bleeding and forms blood clots (platelets). You will be monitored with blood tests to be sure this does not occur.  Warfarin is an anticoagulant that can be swallowed. It  takes a few days to start working, so usually heparin or related medicines are used in combination. Once warfarin is working, heparin is usually stopped.  Less commonly, clot dissolving drugs (thrombolytics) are used to dissolve a DVT. They carry a high risk of bleeding, so they are used mainly in severe cases, where your life or a limb is threatened.  Very rarely, a blood clot in the leg needs to be removed surgically.  If you are unable to take anticoagulants, your health care provider may arrange for you to have a filter placed in a main vein in your abdomen. This filter prevents clots from traveling to your lungs. HOME CARE INSTRUCTIONS  Take all medicines prescribed by your health care provider. Only take over-the-counter or prescription medicines for pain, fever, or discomfort as directed by your health care provider.  Warfarin. Most people will continue taking warfarin after hospital discharge. Your health care provider will advise you on the length of treatment (usually 3 6 months, sometimes lifelong).  Too much and too little warfarin  are both dangerous. Too much warfarin increases the risk of bleeding. Too little warfarin continues to allow the risk for blood clots. While taking warfarin, you will need to have regular blood tests to measure your blood clotting time. These blood tests usually include both the prothrombin time (PT) and international normalized ratio (INR) tests. The PT and INR results allow your health care provider to adjust your dose of warfarin. The dose can change for many reasons. It is critically important that you take warfarin exactly as prescribed, and that you have your PT and INR levels drawn exactly as directed.  Many foods, especially foods high in vitamin K, can interfere with warfarin and affect the PT and INR results. Foods high in vitamin K include spinach, kale, broccoli, cabbage, collard and turnip greens, brussel sprouts, peas, cauliflower, seaweed, and  parsley as well as beef and pork liver, green tea, and soybean oil. You should eat a consistent amount of foods high in vitamin K. Avoid major changes in your diet, or notify your health care provider before changing your diet. Arrange a visit with a dietitian to answer your questions.  Many medicines can interfere with warfarin and affect the PT and INR results. You must tell your health care provider about any and all medicines you take. This includes all vitamins and supplements. Be especially cautious with aspirin and anti-inflammatory medicines. Ask your health care provider before taking these. Do not take or discontinue any prescribed or over-the-counter medicine except on the advice of your health care provider or pharmacist.  Warfarin can have side effects, primarily excessive bruising or bleeding. You will need to hold pressure over cuts for longer than usual. Your health care provider or pharmacist will discuss other potential side effects.  Alcohol can change the body's ability to handle warfarin. It is best to avoid alcoholic drinks or consume only very small amounts while taking warfarin. Notify your health care provider if you change your alcohol intake.  Notify your dentist or other health care providers before procedures.  Activity. Ask your health care provider how soon you can go back to normal activities. It is important to stay active to prevent blood clots. If you are on anticoagulant medicine, avoid contact sports.  Exercise. It is very important to exercise. This is especially important while traveling, sitting, or standing for long periods of time. Exercise your legs by walking or by pumping the muscles frequently. Take frequent walks.  Compression stockings. These are tight elastic stockings that apply pressure to the lower legs. This pressure can help keep the blood in the legs from clotting. You may need to wear compression stockings at home to help prevent a DVT.  Do not  smoke. If you smoke, quit. Ask your health care provider for help with quitting smoking.  Learn as much as you can about DVT. Knowing more about the condition should help you keep it from coming back.  Wear a medical alert bracelet or carry a medical alert card. SEEK MEDICAL CARE IF:  You notice a rapid heartbeat.  You feel weaker or more tired than usual.  You feel faint.  You notice increased bruising.  You feel your symptoms are not getting better in the time expected.  You believe you are having side effects of medicine. SEEK IMMEDIATE MEDICAL CARE IF:  You have chest pain.  You have trouble breathing.  You have new or increased swelling or pain in one leg.  You cough up blood.  You notice blood  in vomit, in a bowel movement, or in urine. MAKE SURE YOU:  Understand these instructions.  Will watch your condition.  Will get help right away if you are not doing well or get worse. Document Released: 01/25/2005 Document Revised: 11/15/2012 Document Reviewed: 10/02/2012 [facility] Patient Information 2014 [place].

## 2013-07-07 NOTE — Progress Notes (Signed)
Pt reported she is starting to feel like her vision is blurred having trouble keeping focus n things.  B/P 129/81 P80 denies headache or any other symptoms besides the vision change. Notified Dr. Caryl Bis. Ok to discharge pt home with follw up n morning with doppler studies.

## 2013-07-07 NOTE — MAU Provider Note (Signed)
History     CSN: [id number]  Arrival date and time: 07/07/13 2035   First Provider Initiated Contact with Patient 07/07/13 2134      Chief Complaint  Patient presents with  . Leg Swelling   HPI Patient is a 28 yo [id number] PPD#4 presenting with swelling in left leg starting this morning. States this "exploded" today. Became more puffy. She denies pain in foot and calf. She does note some numbness in the top of her foot with some tingling. States the foot feels like dead weight. She notes pain in her back that she states is at the epidural insertion site. She denies chest pain and shortness of breath. She has been able to walk ok today. She denies recent fevers. She notes a history of bone cancer 3 years ago that was treated with surgical removal. She has been able to ambulate well since leaving the hospital. She denies weakness in LE.  OB History   Grav Para Term Preterm Abortions TAB SAB Ect Mult Living   10 4 4  0 6 1 5  0 0 3      Past Medical History  Diagnosis Date  . Urinary tract bacterial infections   . OBESITY, NOS   . Urge urinary incontinence   . Recurrent UTI   . Axillary adenopathy   . Amenorrhea due to oral contraceptive   .  Supervision of high-risk pregnancy in first trimester   . Tachycardia   . Previous cesarean delivery, antepartum condition or complication   . Congenital heart disease, fetal, affecting care of mother, antepartum-in previous child   . Obesity complicating peripregnancy, antepartum   . History of IUGR (intrauterine growth retardation) and stillbirth, currently pregnant   . Pregnancy complicated by previous recurrent miscarriages   . Dysrhythmia     pregnancy related irregular and rapid heart rate, has followed up with cardiologist  . Shortness of breath   . Anxiety   . Depression   . Chronic kidney disease     history of kidney stones  . GERD (gastroesophageal reflux disease)     with pregnancy  . Headache(784.0)     migraines  . Cancer      ankle bone area removed  . Anemia     history after pregnancy  . Complication of anesthesia     low blood pressure with general anesthesia    Past Surgical History  Procedure Laterality Date  . Ankle surgery Right 2012    cancer  . Cesarean section      X 2 - failure to progress and repeat elective  . Cesarean section with bilateral tubal ligation N/A 07/03/2013    Procedure: REPEAT CESAREAN SECTION X 3 WITH BILATERAL TUBAL LIGATION;  Surgeon: [name], MD;  Location: [facility];  Service: Obstetrics;  Laterality: N/A;  Dr [name] request CSE anesthesia, also requested to be in room for taping/positioning of patient. 5/25 TWD    Family History  Problem Relation Age of Onset  . Cancer Other   . Hypertension Other   . Hyperlipidemia Other   . Blindness Other   . Deafness Other   . Diabetes Father   . Heart disease Other     mother adopted but reported strong history    History  Substance Use Topics  . Smoking status: Never Smoker   . Smokeless tobacco: Never Used  . Alcohol Use: No    Allergies:  Allergies  Allergen Reactions  . Tomato     Hives  .  Eggs Or Egg-Derived Products Hives and Rash  . Latex Hives and Rash    "MAKES FLESH RAW"    Prescriptions prior to admission  Medication Sig Dispense Refill  . acetaminophen (TYLENOL) 650 MG CR tablet Take 650 mg by mouth every 8 (eight) hours as needed for pain.      . calcium carbonate (TUMS - DOSED IN MG ELEMENTAL CALCIUM) 500 MG chewable tablet Chew 1 tablet by mouth 3 (three) times daily as needed for indigestion or heartburn.      . Fenugreek 610 MG CAPS Take 610 mg by mouth 3 (three) times daily.      Marland Kitchen ibuprofen (ADVIL,MOTRIN) 600 MG tablet Take 600 mg by mouth every 6 (six) hours as needed.      . diazepam (VALIUM) 5 MG tablet Take 1 tablet (5 mg total) by mouth once. Take 1 hour prior to your appointment  1 tablet  0  . oxyCODONE-acetaminophen (PERCOCET/ROXICET) 5-325 MG per tablet Take 1-2 tablets by  mouth every 4 (four) hours as needed for severe pain (moderate - severe pain).  15 tablet  0    ROS see HPI Physical Exam   Blood pressure 117/60, pulse 94, temperature 99 F (37.2 C), temperature source Oral, resp. rate 18, height 5\' 4"  (1.626 m), weight 173.274 kg (382 lb), last menstrual period 09/25/2012, currently breastfeeding.  Physical Exam  Constitutional: She appears well-developed and well-nourished. No distress.  HENT:  Head: Normocephalic and atraumatic.  Cardiovascular: Normal rate, regular rhythm and normal heart sounds.   Respiratory: Effort normal and breath sounds normal.  Musculoskeletal:  Left LE with increased sized compared to right LE, 64.5 cm compared to 61 cm No pitting edema No midline spinal tenderness noted, there is right paraspinal muscle tenderness, minimal tenderness on the left side, no noted spasm Negative homans. There is tenderness in the left lateral calf at the site of a small veracose vein.  Neurological: She has normal reflexes.  5/5 strength in bilateral hip flexors, quads, plantarflexion and dorsiflexion, sensation to light touch decreased on dorsum of foot in the L5 distribution, normal sensation to light touch elsewhere in bilateral LE    MAU Course  Procedures  MDM Patient with increased swelling in left LE compared to right LE. Given recent c-section, obesity, and history of cancer the concern would be for a DVT at this time. Orders for dopplers of the LLE were placed and evidently these can not be done after 7 pm. Called vascular lab to leave a message to get this done tomorrow at the [facility] ED tomorrow. We will give the patient a treatment dose of lovenox tonight and send home with treatment dose lovenox to take BID until she is called with results of dopplers. Patient was given directions to present to the [facility] emergency department for her doppler of her LLE tomorrow morning between 8-8:30 am. If she is unable to get this done  tomorrow she will present to the ED on Monday to have this done. She was provided with a prescription for lovenox to cover 4-5 days in case she is not able to get the ultrasound tomorrow.  Assessment and Plan  Patient is a 28 yo [id number] PPD#4 presenting with LLE swelling concerning for a DVT. Patient to have outpatient venous dopplers done tomorrow. Given lovenox and prescription for lovenox to treat for potential DVT. Numbness in left foot could be related to her DVT vs a nerve compression in her low back. Her strength is  intact in LE and no midline tenderness to indicate need for imaging at this time. She will need to follow-up as an outpatient for potential PT vs imaging of her low back to help with her back pain (likely MSK in origin) and foot numbness. Patient given return precautions and advised that she needed to continue the lovenox until she heard from one of the physicians in the [facility] practice regarding the results of her dopplers. She is to follow-up at her already scheduled appointment this Wednesday.  Leone Haven 07/07/2013, 9:53 PM   I spoke with and examined patient and agree with resident/PA/SNM's note and plan of care.  Roma Schanz, CNM, WHNP-BC 07/08/2013 1:40 AM

## 2013-07-08 ENCOUNTER — Ambulatory Visit (HOSPITAL_COMMUNITY)
Admission: RE | Admit: 2013-07-08 | Discharge: 2013-07-08 | Disposition: A | Discharge status: Home / Self Care | Payer: Managed Care, Other (non HMO) | Source: Ambulatory Visit | Attending: Family Medicine | Admitting: Family Medicine

## 2013-07-08 DIAGNOSIS — M7989 Other specified soft tissue disorders: Secondary | ICD-10-CM | POA: Insufficient documentation

## 2013-07-08 NOTE — Progress Notes (Signed)
VASCULAR LAB PRELIMINARY  PRELIMINARY  PRELIMINARY  PRELIMINARY  Left lower extremity venous Doppler completed.    Preliminary report:  There is no DVT or SVT noted in the left lower extremity.  Iantha Fallen, RVT 07/08/2013, 9:18 AM

## 2013-07-09 NOTE — MAU Provider Note (Signed)
Attestation of Attending Supervision of Advanced Practitioner (CNM/NP): Evaluation and management procedures were performed by the Advanced Practitioner under my supervision and collaboration.  I have reviewed the Advanced Practitioner's note and chart, and I agree with the management and plan.  Marta Bouie 07/09/2013 9:05 AM

## 2013-07-10 ENCOUNTER — Inpatient Hospital Stay (HOSPITAL_COMMUNITY)
Admission: AD | Admit: 2013-07-10 | Discharge: 2013-07-10 | Disposition: A | Discharge status: Home / Self Care | Payer: Managed Care, Other (non HMO) | Source: Ambulatory Visit | Attending: Obstetrics & Gynecology | Admitting: Obstetrics & Gynecology

## 2013-07-10 ENCOUNTER — Ambulatory Visit (HOSPITAL_COMMUNITY)
Admit: 2013-07-10 | Discharge: 2013-07-10 | Disposition: A | Discharge status: Home / Self Care | Payer: Managed Care, Other (non HMO) | Attending: Family Medicine | Admitting: Family Medicine

## 2013-07-10 ENCOUNTER — Encounter (HOSPITAL_COMMUNITY): Payer: Self-pay

## 2013-07-10 DIAGNOSIS — Z9889 Other specified postprocedural states: Secondary | ICD-10-CM

## 2013-07-10 DIAGNOSIS — O909 Complication of the puerperium, unspecified: Secondary | ICD-10-CM | POA: Diagnosis not present

## 2013-07-10 DIAGNOSIS — Z98891 History of uterine scar from previous surgery: Secondary | ICD-10-CM

## 2013-07-10 NOTE — Lactation Note (Addendum)
Adult Lactation Consultation Outpatient Visit Note  Mom has a history of low MS.  Bf using the SNS every 3-4 hours and gaining well.  SHe takes 2-4 oz of formula at a feeding.  Today Kathleen Andrews transferred 13 ml of BM and 35 ml of formula.  Plan is to work on increasing Cuming.  Pt may try More Milk Plus.  She also will increase pumping and use hand expression at least 4-5 times in 24 hours.  She is encourage because she is making more milk than she was with her 28 year old.  Note: Baby's labial frenum inserts beyond the alveolar ridge.  Though she transfer milk from the SNS sometimes mom has to squeeze the bottle to get Kathleen Andrews to start sucking. Mom to schedule appointment as her MS increases.  Patient Name: Kathleen Andrews Date of Birth: 11/09/1985  Birth weight 9+12 Gestational Age at Delivery: [redacted]w[redacted]d Type of Delivery: c section  Breastfeeding History: Frequency of Breastfeeding: every 3-4 hours Length of Feeding: 30-45 Voids: 6 Stools: 3-4  Supplementing / Method: Pumping:  Type of Pump: medela   Frequency:4-5 times  Volume:  15 ml    Comments:    Consultation Evaluation:  Initial Feeding Assessment: Pre-feed [id number] Post-feed [id number] Amount Transferred:48 Comments:   Total Breast milk Transferred this Visit: 13 Total Supplement Given: [address] 07/10/2013, 1:07 PM

## 2013-07-10 NOTE — MAU Note (Signed)
Pt reports she has a wound vac, s/p c-section one week ago. States it started leaving.

## 2013-07-10 NOTE — MAU Provider Note (Signed)
Chief Complaint:  No chief complaint on file.   Kathleen Andrews is a 28 y.o.  [id number] with IUP at [redacted]w[redacted]d presenting for wound vac removal.  She had a c-section last week. She is scheduled for would vac removal is Wednesday. The wound vac was not staying on and had an odor to it.  She called the after hours line and told to come to the MAU.  She denies any fever, streaking or purulent drainage from the site.   Menstrual History: OB History   Grav Para Term Preterm Abortions TAB SAB Ect Mult Living   10 4 4  0 6 1 5  0 0 3       Patient's last menstrual period was 09/25/2012.      Past Medical History  Diagnosis Date  . Urinary tract bacterial infections   . OBESITY, NOS   . Urge urinary incontinence   . Recurrent UTI   . Axillary adenopathy   . Amenorrhea due to oral contraceptive   .  Supervision of high-risk pregnancy in first trimester   . Tachycardia   . Previous cesarean delivery, antepartum condition or complication   . Congenital heart disease, fetal, affecting care of mother, antepartum-in previous child   . Obesity complicating peripregnancy, antepartum   . History of IUGR (intrauterine growth retardation) and stillbirth, currently pregnant   . Pregnancy complicated by previous recurrent miscarriages   . Dysrhythmia     pregnancy related irregular and rapid heart rate, has followed up with cardiologist  . Shortness of breath   . Anxiety   . Depression   . Chronic kidney disease     history of kidney stones  . GERD (gastroesophageal reflux disease)     with pregnancy  . Headache(784.0)     migraines  . Cancer     ankle bone area removed  . Anemia     history after pregnancy  . Complication of anesthesia     low blood pressure with general anesthesia    Past Surgical History  Procedure Laterality Date  . Ankle surgery Right 2012    cancer  . Cesarean section      X 2 - failure to progress and repeat elective  . Cesarean section with bilateral tubal ligation  N/A 07/03/2013    Procedure: REPEAT CESAREAN SECTION X 3 WITH BILATERAL TUBAL LIGATION;  Surgeon: [name], MD;  Location: [facility];  Service: Obstetrics;  Laterality: N/A;  Dr [name] request CSE anesthesia, also requested to be in room for taping/positioning of patient. 5/25 TWD    Family History  Problem Relation Age of Onset  . Cancer Other   . Hypertension Other   . Hyperlipidemia Other   . Blindness Other   . Deafness Other   . Diabetes Father   . Heart disease Other     mother adopted but reported strong history    History  Substance Use Topics  . Smoking status: Never Smoker   . Smokeless tobacco: Never Used  . Alcohol Use: No      Allergies  Allergen Reactions  . Tomato     Hives  . Eggs Or Egg-Derived Products Hives and Rash  . Latex Hives and Rash    "MAKES FLESH RAW"    Prescriptions prior to admission  Medication Sig Dispense Refill  . acetaminophen (TYLENOL) 650 MG CR tablet Take 650 mg by mouth every 8 (eight) hours as needed for pain.      . calcium carbonate (TUMS -  DOSED IN MG ELEMENTAL CALCIUM) 500 MG chewable tablet Chew 1 tablet by mouth 3 (three) times daily as needed for indigestion or heartburn.      . diazepam (VALIUM) 5 MG tablet Take 1 tablet (5 mg total) by mouth once. Take 1 hour prior to your appointment  1 tablet  0  . enoxaparin (LOVENOX) 150 MG/ML injection Inject 1.16 mLs (175 mg total) into the skin every 12 (twelve) hours.  15 Syringe  0  . Fenugreek 610 MG CAPS Take 610 mg by mouth 3 (three) times daily.      Marland Kitchen ibuprofen (ADVIL,MOTRIN) 600 MG tablet Take 600 mg by mouth every 6 (six) hours as needed.      Marland Kitchen oxyCODONE-acetaminophen (PERCOCET/ROXICET) 5-325 MG per tablet Take 1-2 tablets by mouth every 4 (four) hours as needed for severe pain (moderate - severe pain).  15 tablet  0    Review of Systems - Negative except for what is mentioned in HPI.  Physical Exam  Blood pressure 136/70, pulse 95, temperature 98.7 F (37.1 C),  temperature source Oral, resp. rate 20, last menstrual period 09/25/2012, SpO2 99.00%, currently breastfeeding. GENERAL: Well-developed, well-nourished female in no acute distress.  ABDOMEN: Soft, nontender, nondistended, gravid, well healed incision, odor present, no acute abdomen, no redness, drainage or streaking, staples in place across incision.  EXTREMITIES: Nontender, no edema, 2+ distal pulses.  Labs: No results found for this or any previous visit (from the past 24 hour(s)).  Imaging Studies:  US Ob Limited  06/29/2013   OBSTETRICAL ULTRASOUND: This exam was performed within a [facility]. The OB US report was generated in the AS system, and faxed to the ordering physician.   This report is also available in [employer] and in the [employer]. See AS Obstetric US report.  US Ob Limited  06/22/2013   OBSTETRICAL ULTRASOUND: This exam was performed within a [place] [facility] Department. The OB US report was generated in the AS system, and faxed to the ordering physician.   This report is also available in [employer] and in the [employer]. See AS Obstetric US report.  US Ob Limited  06/19/2013   OBSTETRICAL ULTRASOUND: This exam was performed within a [place] [facility] Department. The OB US report was generated in the AS system, and faxed to the ordering physician.   This report is also available in [employer] and in the [employer]. See AS Obstetric US report.  US Ob Follow Up  06/15/2013   OBSTETRICAL ULTRASOUND: This exam was performed within a [place] [facility]trasound Department. The OB US report was generated in the AS system, and faxed to the ordering physician.   This report is also available in [employer] and in the [employer]. See AS Obstetric US report.  US Fetal Bpp W/o Non Stress  06/19/2013   OBSTETRICAL ULTRASOUND: This exam was performed within a [facility] Ultrasound Department. The OB US report was generated in the AS system, and faxed to the ordering physician.   This report is also available in [employer] and in the [employer]. See AS Obstetric US report.   Assessment: Kathleen Andrews is  28 y.o. [id number] at [redacted]w[redacted]d presents with wound vac removal s/p c-section.  Plan: Discharge to home. Discussed with Dr. Leslie Andrea.   #s/p c-section: wound vac removed. Vacuum failed. Incision is healing well with no drainage. Site is cleaned with alcohol pad and padding place  over incision.  - f/u as scheduled on Wednesday for staple removal.    Rosemarie Ax 6/2/20152:01 AM

## 2013-07-10 NOTE — Discharge Instructions (Signed)
Postpartum Care After Cesarean Delivery °After you deliver your newborn (postpartum period), the usual stay in the hospital is 24 72 hours. If there were problems with your labor or delivery, or if you have other medical problems, you might be in the hospital longer.  °While you are in the hospital, you will receive help and instructions on how to care for yourself and your newborn during the postpartum period.  °While you are in the hospital: °· It is normal for you to have pain or discomfort from the incision in your abdomen. Be sure to tell your nurses when you are having pain, where the pain is located, and what makes the pain worse. °· If you are breastfeeding, you may feel uncomfortable contractions of your uterus for a couple of weeks. This is normal. The contractions help your uterus get back to normal size. °· It is normal to have some bleeding after delivery. °· For the first 1 3 days after delivery, the flow is red and the amount may be similar to a period. °· It is common for the flow to start and stop. °· In the first few days, you may pass some small clots. Let your nurses know if you begin to pass large clots or your flow increases. °· Do not  flush blood clots down the toilet before having the nurse look at them. °· During the next 3 10 days after delivery, your flow should become more watery and pink or brown-tinged in color. °· Ten to fourteen days after delivery, your flow should be a small amount of yellowish-white discharge. °· The amount of your flow will decrease over the first few weeks after delivery. Your flow may stop in 6 8 weeks. Most women have had their flow stop by 12 weeks after delivery. °· You should change your sanitary pads frequently. °· Wash your hands thoroughly with soap and water for at least 20 seconds after changing pads, using the toilet, or before holding or feeding your newborn. °· Your intravenous (IV) tubing will be removed when you are drinking enough fluids. °· The  urine drainage tube (urinary catheter) that was inserted before delivery may be removed within 6 8 hours after delivery or when feeling returns to your legs. You should feel like you need to empty your bladder within the first 6 8 hours after the catheter has been removed. °· In case you become weak, lightheaded, or faint, call your nurse before you get out of bed for the first time and before you take a shower for the first time. °· Within the first few days after delivery, your breasts may begin to feel tender and full. This is called engorgement. Breast tenderness usually goes away within 48 72 hours after engorgement occurs. You may also notice milk leaking from your breasts. If you are not breastfeeding, do not stimulate your breasts. Breast stimulation can make your breasts produce more milk. °· Spending as much time as possible with your newborn is very important. During this time, you and your newborn can feel close and get to know each other. Having your newborn stay in your room (rooming in) will help to strengthen the bond with your newborn. It will give you time to get to know your newborn and become comfortable caring for your newborn. °· Your hormones change after delivery. Sometimes the hormone changes can temporarily cause you to feel sad or tearful. These feelings should not last more than a few days. If these feelings last longer   than that, you should talk to your caregiver. °· If desired, talk to your caregiver about methods of family planning or contraception. °· Talk to your caregiver about immunizations. Your caregiver may want you to have the following immunizations before leaving the hospital: °· Tetanus, diphtheria, and pertussis (Tdap) or tetanus and diphtheria (Td) immunization. It is very important that you and your family (including grandparents) or others caring for your newborn are up-to-date with the Tdap or Td immunizations. The Tdap or Td immunization can help protect your newborn  from getting ill. °· Rubella immunization. °· Varicella (chickenpox) immunization. °· Influenza immunization. You should receive this annual immunization if you did not receive the immunization during your pregnancy. °Document Released: 10/20/2011 Document Reviewed: 10/20/2011 °[employer]® Patient Information ©2014 [employer]. ° °

## 2013-07-11 ENCOUNTER — Ambulatory Visit (INDEPENDENT_AMBULATORY_CARE_PROVIDER_SITE_OTHER): Payer: Managed Care, Other (non HMO) | Admitting: *Deleted

## 2013-07-11 DIAGNOSIS — Z4802 Encounter for removal of sutures: Secondary | ICD-10-CM

## 2013-07-11 DIAGNOSIS — Z09 Encounter for follow-up examination after completed treatment for conditions other than malignant neoplasm: Secondary | ICD-10-CM

## 2013-07-11 NOTE — Progress Notes (Signed)
Unable to obtain vital signs at patient's request.  Pt states she is nervous and blood pressure is probably high.  Cleaned wound with Chlorhexidine wipes and placed steri strips.

## 2013-07-18 ENCOUNTER — Ambulatory Visit (INDEPENDENT_AMBULATORY_CARE_PROVIDER_SITE_OTHER): Payer: Managed Care, Other (non HMO) | Admitting: General Practice

## 2013-07-18 VITALS — BP 123/80 | HR 83 | Temp 98.3°F | Ht 66.0 in | Wt 360.8 lb

## 2013-07-18 DIAGNOSIS — Z09 Encounter for follow-up examination after completed treatment for conditions other than malignant neoplasm: Secondary | ICD-10-CM

## 2013-07-18 DIAGNOSIS — Z5189 Encounter for other specified aftercare: Secondary | ICD-10-CM

## 2013-07-18 NOTE — Progress Notes (Signed)
Patient here today for wound check, was seen last week and had wound vac removed and came back the next day for cleaning of incision. Patient states that she is able to clean the incision most days and denies pain or warmth at incision site or fever. Incision is intact but slightly moist. No drainage or warmth noted at incision site. Area around the incision does look a little raw but patient states it has looked this way- possible due to irritation from tape as that is the area of irritation. Cleaned incision with saline and dried thoroughly with gauze. Placed clean abd pad over incision and gave patient a couple to go home with. Discussed with patient that it is important she clean the area every day but that it is equally important she dry it thoroughly as well. Also discussed warning signs of incision infection as well. Patient verbalized understanding to all and reminded patient to set up a pp visit before she leaves today as well so we can continue to monitor her and the incision. Patient verbalized understanding and had no questions. Pp visit scheduled for 7/9

## 2013-07-18 NOTE — Progress Notes (Addendum)
Agree with RN note

## 2013-08-16 ENCOUNTER — Encounter: Payer: Self-pay | Admitting: Family Medicine

## 2013-08-16 ENCOUNTER — Ambulatory Visit (INDEPENDENT_AMBULATORY_CARE_PROVIDER_SITE_OTHER): Payer: Managed Care, Other (non HMO) | Admitting: Family Medicine

## 2013-08-16 NOTE — Progress Notes (Signed)
Patient ID: Kathleen Andrews, female   DOB: 08/08/1985, 28 y.o.   MRN: [id number] Subjective:    Kathleen Andrews is a 28 y.o. [id number] African American female who presents for a postpartum visit. She is 6 weeks postpartum following a low cervical transverse Cesarean section and BTL. I have fully reviewed the prenatal and intrapartum course. The delivery was at 13 gestational weeks. Outcome: repeat cesarean section, low transverse incision. Anesthesia: spinal. Postpartum course has been uncomplicated. Baby's course has been uncomplicated. Baby is feeding by bottle - gerber goodstart. Bleeding no bleeding. Bowel function is normal. Bladder function is normal. Patient is sexually active. Contraception method is tubal ligation. Postpartum depression screening: negative.  The following portions of the patient's history were reviewed and updated as appropriate: allergies, current medications, past medical history, past surgical history and problem list.  Review of Systems Pertinent items are noted in HPI.   There were no vitals filed for this visit.  Objective:     General:  alert, cooperative and no distress   Breasts:  deferred, no complaints  Lungs: clear to auscultation bilaterally  Heart:  regular rate and rhythm  Abdomen: soft, nontender, obese, well healed overall c/s scar. Small 2cm area in the middle with a raised edge of skin that has not fully re-epithelialized. No evidence of infection or wound breakdown.   Rectal Exam: no hemorrhoids        Assessment:   normal postpartum exam 6 wks s/p rLTCS with BTL Depression screening Contraception counseling   Plan:   Contraception: s/p BTL C/s- small area likely staying moist due to pannus. Discussed rolling a washcloth in that area to keep it dry. No signs of infection or dehiscence and deeper tissue all healed. Just needing to re-epithelize. Advised to call if not improved in the next 4 weeks.   Follow up in: 1 year for annual or as needed.

## 2013-08-16 NOTE — Patient Instructions (Signed)
Cesarean Delivery °Care After °Refer to this sheet in the next few weeks. These instructions provide you with information on caring for yourself after your procedure. Your health care provider may also give you specific instructions. Your treatment has been planned according to current medical practices, but problems sometimes occur. Call your health care provider if you have any problems or questions after you go home. °HOME CARE INSTRUCTIONS  °· Only take over-the-counter or prescription medications as directed by your health care provider. °· Do not drink alcohol, especially if you are breastfeeding or taking medication to relieve pain. °· Do not chew or smoke tobacco. °· Continue to use good perineal care. Good perineal care includes: °· Wiping your perineum from front to back. °· Keeping your perineum clean. °· Check your surgical cut (incision) daily for increased redness, drainage, swelling, or separation of skin. °· Clean your incision gently with soap and water every day, and then pat it dry. If your health care provider says it is OK, leave the incision uncovered. Use a bandage (dressing) if the incision is draining fluid or appears irritated. If the adhesive strips across the incision do not fall off within 7 days, carefully peel them off. °· Hug a pillow when coughing or sneezing until your incision is healed. This helps to relieve pain. °· Do not use tampons or douche until your health care provider says it is okay. °· Shower, wash your hair, and take tub baths as directed by your health care provider. °· Wear a well-fitting bra that provides breast support. °· Limit wearing support panties or control-top hose. °· Drink enough fluids to keep your urine clear or pale yellow. °· Eat high-fiber foods such as whole grain cereals and breads, brown rice, beans, and fresh fruits and vegetables every day. These foods may help prevent or relieve constipation. °· Resume activities such as climbing stairs,  driving, lifting, exercising, or traveling as directed by your health care provider. °· Talk to your health care provider about resuming sexual activities. This is dependent upon your risk of infection, your rate of healing, and your comfort and desire to resume sexual activity. °· Try to have someone help you with your household activities and your newborn for at least a few days after you leave the hospital. °· Rest as much as possible. Try to rest or take a nap when your newborn is sleeping. °· Increase your activities gradually. °· Keep all of your scheduled postpartum appointments. It is very important to keep your scheduled follow-up appointments. At these appointments, your health care provider will be checking to make sure that you are healing physically and emotionally. °SEEK MEDICAL CARE IF:  °· You are passing large clots from your vagina. Save any clots to show your health care provider. °· You have a foul smelling discharge from your vagina. °· You have trouble urinating. °· You are urinating frequently. °· You have pain when you urinate. °· You have a change in your bowel movements. °· You have increasing redness, pain, or swelling near your incision. °· You have pus draining from your incision. °· Your incision is separating. °· You have painful, hard, or reddened breasts. °· You have a severe headache. °· You have blurred vision or see spots. °· You feel sad or depressed. °· You have thoughts of hurting yourself or your newborn. °· You have questions about your care, the care of your newborn, or medications. °· You are dizzy or lightheaded. °· You have a rash. °· You   have pain, redness, or swelling at the site of the removed intravenous access (IV) tube.  You have nausea or vomiting.  You stopped breastfeeding and have not had a menstrual period within 12 weeks of stopping.  You are not breastfeeding and have not had a menstrual period within 12 weeks of delivery.  You have a fever. SEEK  IMMEDIATE MEDICAL CARE IF:  You have persistent pain.  You have chest pain.  You have shortness of breath.  You faint.  You have leg pain.  You have stomach pain.  Your vaginal bleeding saturates 2 or more sanitary pads in 1 hour. MAKE SURE YOU:   Understand these instructions.  Will watch your condition.  Will get help right away if you are not doing well or get worse. Document Released: 10/17/2001 Document Revised: 09/27/2012 Document Reviewed: 09/22/2011 [facility] Patient Information 2015 [place], [place]. This information is not intended to replace advice given to you by your health care provider. Make sure you discuss any questions you have with your health care provider.

## 2013-12-10 ENCOUNTER — Encounter: Payer: Self-pay | Admitting: Family Medicine

## 2013-12-24 ENCOUNTER — Encounter: Payer: Self-pay | Admitting: Family Medicine

## 2013-12-24 ENCOUNTER — Ambulatory Visit (INDEPENDENT_AMBULATORY_CARE_PROVIDER_SITE_OTHER): Payer: Managed Care, Other (non HMO) | Admitting: Family Medicine

## 2013-12-24 VITALS — BP 123/75 | HR 80 | Temp 98.1°F | Ht 66.0 in | Wt 389.0 lb

## 2013-12-24 DIAGNOSIS — N3941 Urge incontinence: Secondary | ICD-10-CM

## 2013-12-24 DIAGNOSIS — Z6841 Body Mass Index (BMI) 40.0 and over, adult: Secondary | ICD-10-CM

## 2013-12-24 DIAGNOSIS — N920 Excessive and frequent menstruation with regular cycle: Secondary | ICD-10-CM

## 2013-12-24 DIAGNOSIS — Z0000 Encounter for general adult medical examination without abnormal findings: Secondary | ICD-10-CM

## 2013-12-24 DIAGNOSIS — Z5181 Encounter for therapeutic drug level monitoring: Secondary | ICD-10-CM

## 2013-12-24 DIAGNOSIS — B351 Tinea unguium: Secondary | ICD-10-CM

## 2013-12-24 LAB — POCT GLYCOSYLATED HEMOGLOBIN (HGB A1C): HEMOGLOBIN A1C: 5.6

## 2013-12-24 MED ORDER — LEVONORGEST-ETH ESTRAD 91-DAY 0.15-0.03 MG PO TABS: 1.0000 | ORAL_TABLET | Freq: Every day | ORAL | Status: DC

## 2013-12-24 MED ORDER — TERBINAFINE HCL 250 MG PO TABS: 250.0000 mg | ORAL_TABLET | Freq: Every day | ORAL | Status: DC

## 2013-12-24 MED ORDER — MIRABEGRON ER 25 MG PO TB24: 25.0000 mg | ORAL_TABLET | Freq: Every day | ORAL | Status: DC

## 2013-12-24 NOTE — Assessment & Plan Note (Signed)
Check CBC and coags to rule out anemia and coagulopathy. PT not willing to take iron supplementation. Will Rx Long-term seasonale.
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
## 2013-12-24 NOTE — Assessment & Plan Note (Addendum)
Hb A1c today wnl. TLC counseling performed. Pt not interested in bariatric surgery.

## 2013-12-24 NOTE — Assessment & Plan Note (Signed)
Reports no effective treatments in the past and still struggling with symptoms. Will Rx myrbetriq and follow up for dose titration. Need to monitor for HTN. Consideration also given to ditropan, could consider this if no improvement.

## 2013-12-24 NOTE — Patient Instructions (Addendum)
-   Start SEASONALE as directed - Take terbinafine by mouth once daily for the next 12 weeks - I will fax in your physical form   Things to do to keep yourself healthy: - Be active at least 30 minutes a day,  4-5 days a week.  - Eat a diet with lots of fruits and vegetables, up to 7-9 servings per day.  - Seatbelts can save your life. Wear them always. - Smoke detectors on every level of your home, check batteries every year. - Depression is common. If you're feeling down or losing interest in things you normally enjoy, please come in for a visit. - Violence: If anyone is threatening or hurting you, please call immediately. You are not alone.

## 2013-12-24 NOTE — Assessment & Plan Note (Signed)
Severe and beginning to spread to 2nd and 3rd toes. Rx terbinafine po x12 weeks. Check LFTs. Reviewed keeping foot dry.

## 2013-12-24 NOTE — Assessment & Plan Note (Signed)
Health maintenance:  - STD screening: Declined - Pap smear: Due 08/2014 - Mammogram: not indicated - Lipid screening  - Flu vaccination - Follow up in July 2016 for pap smear

## 2013-12-25 LAB — COMPREHENSIVE METABOLIC PANEL
ALK PHOS: 116 U/L (ref 39–117)
ALT: 32 U/L (ref 0–35)
AST: 23 U/L (ref 0–37)
Albumin: 3.8 g/dL (ref 3.5–5.2)
BUN: 10 mg/dL (ref 6–23)
CO2: 26 meq/L (ref 19–32)
CREATININE: 0.71 mg/dL (ref 0.50–1.10)
Calcium: 9.4 mg/dL (ref 8.4–10.5)
Chloride: 100 mEq/L (ref 96–112)
GLUCOSE: 87 mg/dL (ref 70–99)
Potassium: 4.1 mEq/L (ref 3.5–5.3)
Sodium: 140 mEq/L (ref 135–145)
TOTAL PROTEIN: 7.9 g/dL (ref 6.0–8.3)
Total Bilirubin: 0.3 mg/dL (ref 0.2–1.2)

## 2013-12-26 LAB — CBC
HEMATOCRIT: 38.4 % (ref 36.0–46.0)
Hemoglobin: 11.8 g/dL — ABNORMAL LOW (ref 12.0–15.0)
MCH: 22.9 pg — AB (ref 26.0–34.0)
MCHC: 30.7 g/dL (ref 30.0–36.0)
MCV: 74.6 fL — AB (ref 78.0–100.0)
MPV: 12.1 fL (ref 9.4–12.4)
Platelets: 294 10*3/uL (ref 150–400)
RBC: 5.15 MIL/uL — ABNORMAL HIGH (ref 3.87–5.11)
RDW: 16.6 % — AB (ref 11.5–15.5)
WBC: 8.7 10*3/uL (ref 4.0–10.5)

## 2013-12-26 LAB — LIPID PANEL
CHOL/HDL RATIO: 2.5 ratio
Cholesterol: 122 mg/dL (ref 0–200)
HDL: 48 mg/dL (ref >39)
LDL CALC: 56 mg/dL (ref 0–99)
TRIGLYCERIDES: 92 mg/dL (ref <150)
VLDL: 18 mg/dL (ref 0–40)

## 2014-02-17 IMAGING — US US OB FOLLOW-UP
1 series · 12 of 28 positions shown · non-contrast
Comparison: none

OBSTETRICS REPORT
                      (Signed Final 03/19/2013 [DATE])

Service(s) Provided
 US OB FOLLOW UP                                       76816.1
Indications
 Previous cesarean section x 3
 History of genetic / anatomic abnormality -
 personal or family: CHD
 Maternal morbid obesity
 Pain - Abdominal/Pelvic
 Follow-up incomplete fetal anatomic evaluation
Fetal Evaluation
 Num Of Fetuses:    1
 Fetal Heart Rate:  163                          bpm
 Cardiac Activity:  Observed
 Presentation:      Cephalic
 Placenta:          Posterior, above cervical
                    os
 P. Cord            Previously Visualized
 Insertion:
 Amniotic Fluid
 AFI FV:      Subjectively within normal limits
                                             Larg Pckt:    6.46  cm
Biometry
 BPD:     59.5  mm     G. Age:  24w 2d                CI:        70.56   70 - 86
                                                      FL/HC:      20.2   18.7 -
 HC:     225.8  mm     G. Age:  24w 4d       74  %    HC/AC:      1.17   1.05 -
 AC:     192.4  mm     G. Age:  24w 0d       54  %    FL/BPD:     76.8   71 - 87
 FL:      45.7  mm     G. Age:  25w 1d       84  %    FL/AC:      23.8   20 - 24
 HUM:     40.8  mm     G. Age:  24w 5d       69  %
 Est. FW:     701  gm      1 lb 9 oz     68  %
Gestational Age
 LMP:           25w 0d        Date:  09/25/12                 EDD:   07/02/13
 U/S Today:     24w 4d                                        EDD:   07/05/13
 Best:          23w 4d     Det. By:  Early Ultrasound         EDD:   07/12/13
                                     (11/15/12)
Anatomy
 Cranium:          Appears normal         Aortic Arch:      Not well visualized
 Fetal Cavum:      Previously seen        Ductal Arch:      Previously seen
 Ventricles:       Appears normal         Diaphragm:        Appears normal
 Choroid Plexus:   Previously seen        Stomach:          Appears normal, left
                                                            sided
 Cerebellum:       Previously seen        Abdomen:          Appears normal
 Posterior Fossa:  Previously seen        Abdominal Wall:   Previously seen
 Nuchal Fold:      Previously seen        Cord Vessels:     Appears normal (3
                                                            vessel cord)
 Face:             Profile appears        Kidneys:          Appear normal
                   normal
 Lips:             Appears normal         Bladder:          Appears normal
 Heart:            Previously seen        Spine:            Limited views
                                                            appear normal
 RVOT:             Previously seen        Lower             Previously seen
                                          Extremities:
 LVOT:             Previously seen        Upper             Previously seen
 Other:  Fetus previously visualized as female. Heels previously appeared
         normal.
Cervix Uterus Adnexa
 Cervical Length:    4.78     cm
 Cervix:       Normal appearance by transvaginal scan
 Uterus:       No abnormality visualized.
 Cul De Sac:   No free fluid seen.
 Left Ovary:    Not visualized.
 Right Ovary:   Not visualized.
 Adnexa:     No abnormality visualized.
Impression
INDICATION: 27 yr old [id number] at 67w7d with history of fetal
 demise and previous child with congenital heart defect for
 follow up ultrasound to complete anatomy. Previous finding of
 placenta previa. Remote read.

[Series 1: us ob follow up · 12 of 62 slices shown]
[im 3/62]
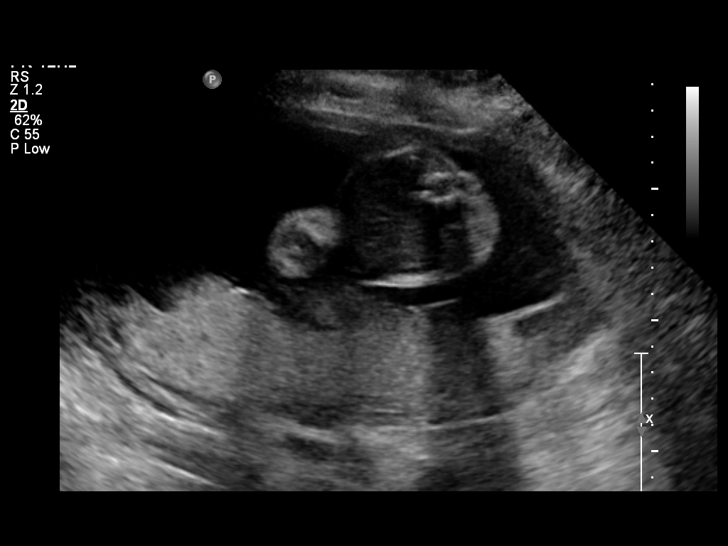
[im 7/62]
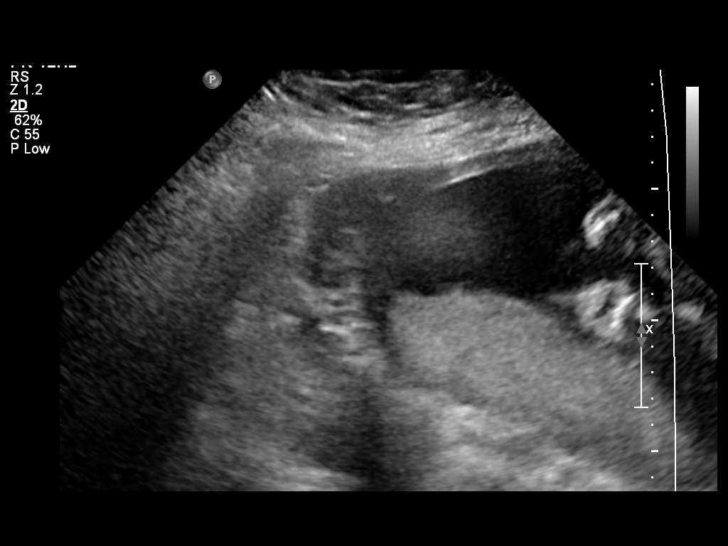
[im 12/62]
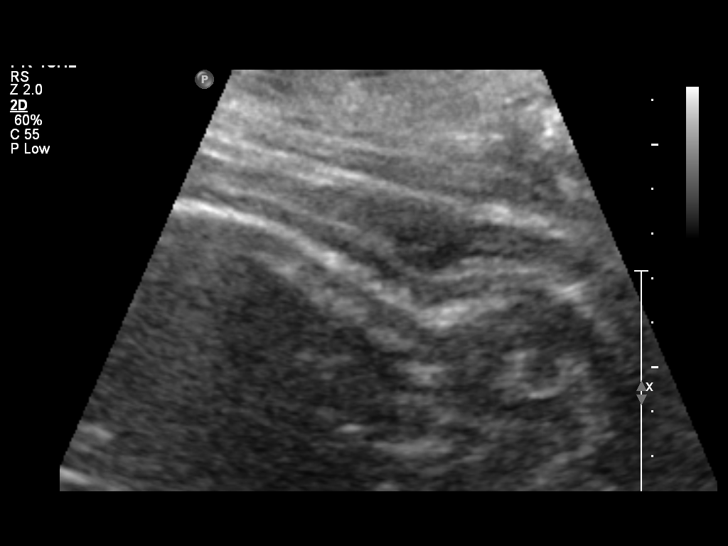
[im 19/62]
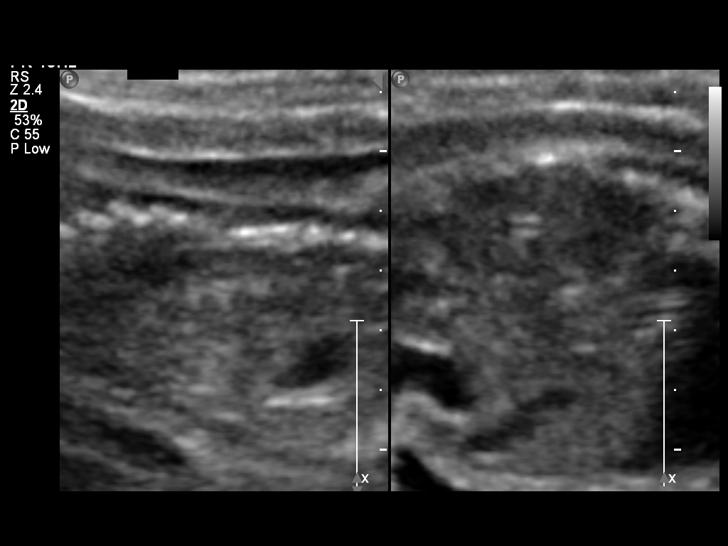
[im 23/62]
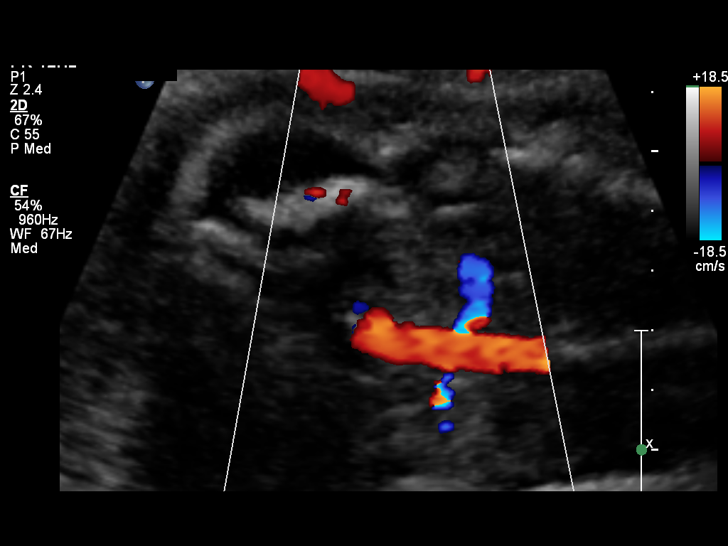
[im 28/62]
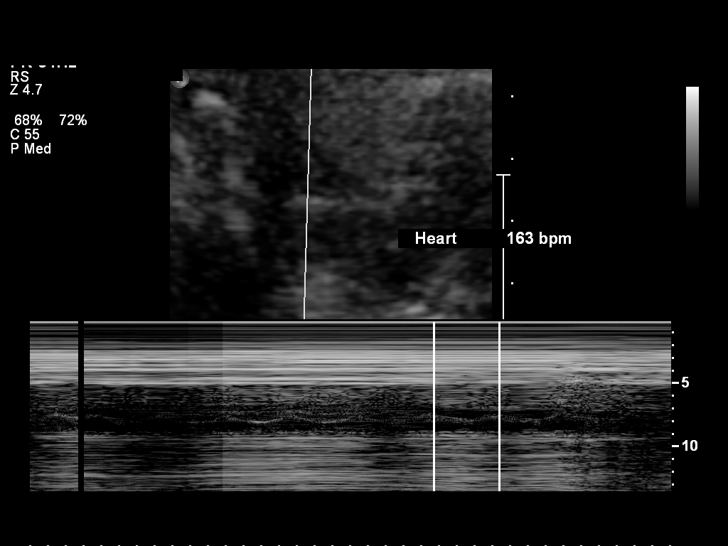
[im 34/62]
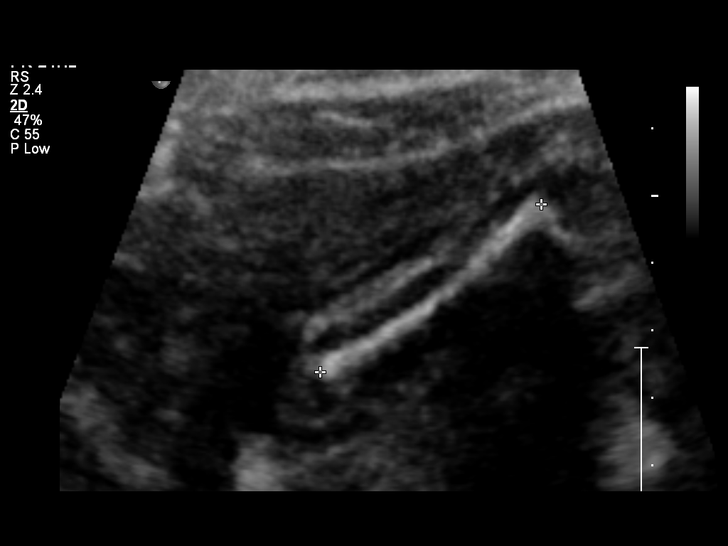
[im 39/62]
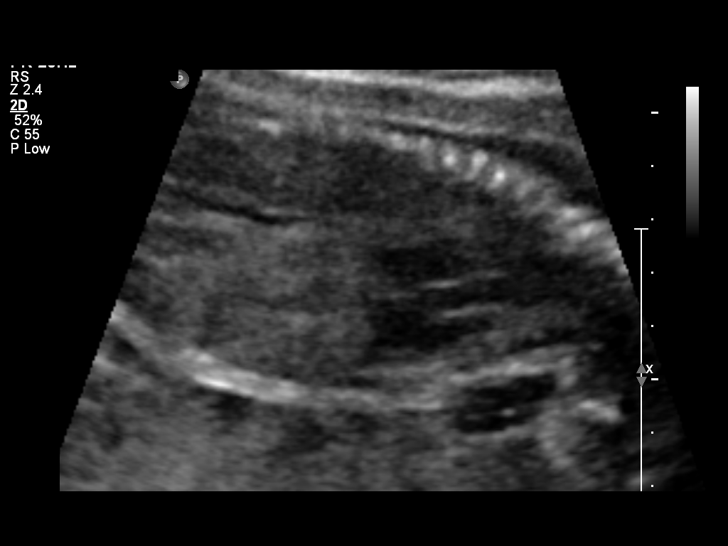
[im 43/62]
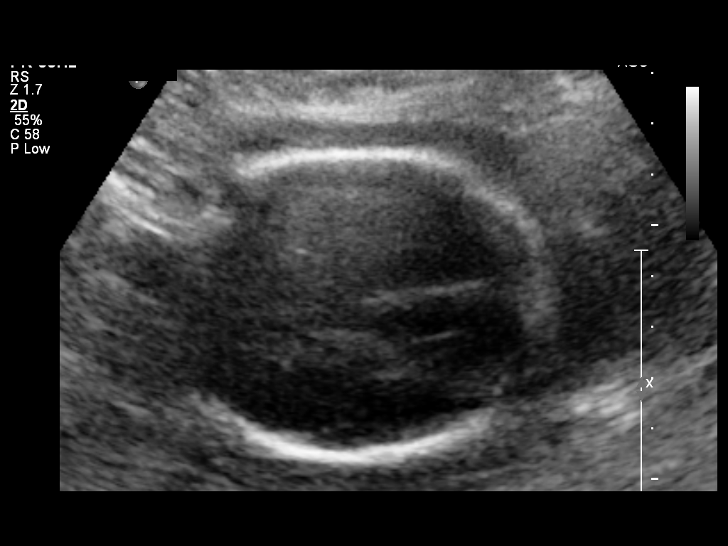
[im 50/62]
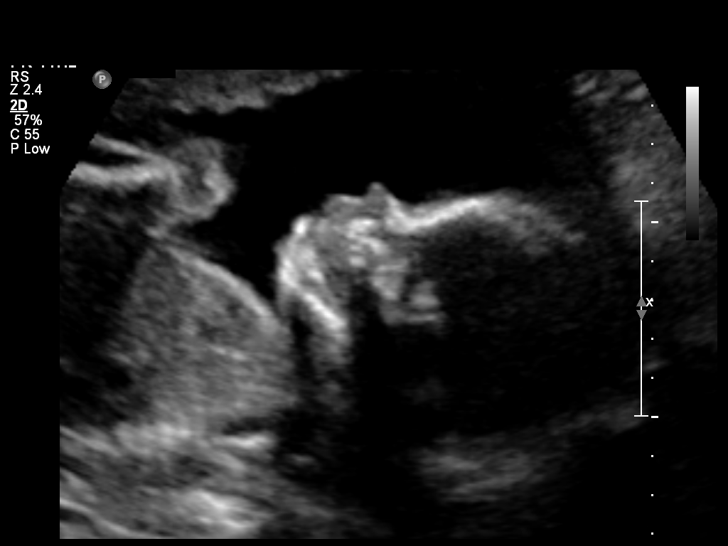
[im 55/62]
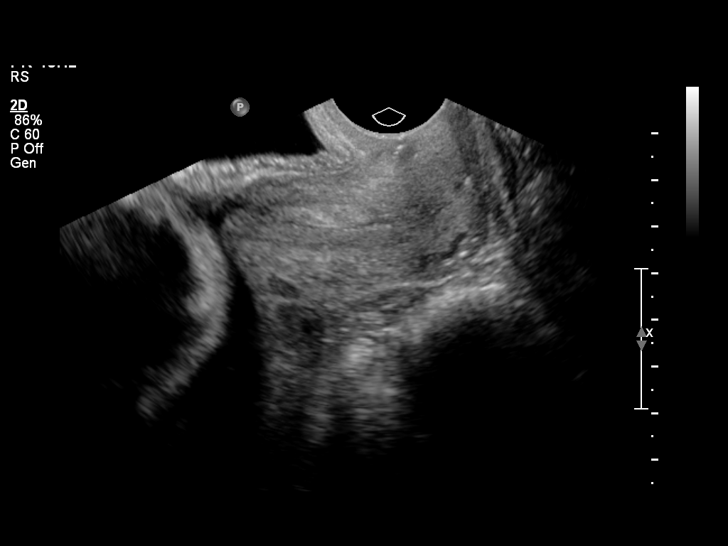
[im 59/62]
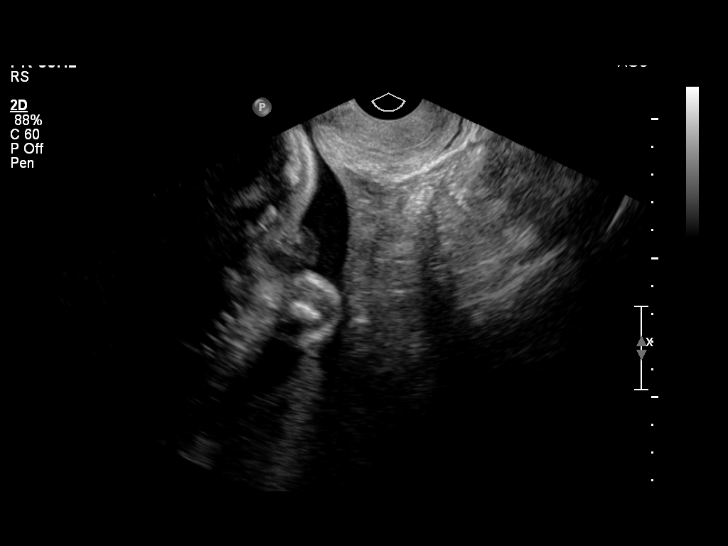

[12 of 28 positions shown; findings below may reference images not displayed]

FINDINGS: 1. Single intrauterine pregnancy.
 2. Estimated fetal weight is in the 68th%.
 3. Posterior placenta without evidence of previa.
 4. Normal amniotic fluid volume.
 5. Normal transvaginal cervical length.
 6. The views of the aortic arch and spine remain limited.
 7. The remainder of the limited anatomy survey is normal.
 Any anatomy not evaluated on today's exam was evaluated
 on the previous exam.
Recommendations

 1. Appropriate fetal growth.
 2. Limited anatomy survey:
 - recommend reevaluate on follow up ultrasounds
 3. Previous child with congenital heart defect:
 - previously counseled
 - had normal fetal echocardiogram; to follow up with Pediatric
 Cardiology after delivery
 4. Previous fetal demise:
 - per the chart history is unclear
 - would recommend obtain full records
 - recommend fetal growth every 4 weeks
 - consider antenatal testing at 32 weeks; dependent on
 records
 5. Placenta previa is resolved

 questions or concerns.

## 2014-02-22 ENCOUNTER — Other Ambulatory Visit: Payer: Self-pay | Admitting: Family Medicine

## 2014-07-09 ENCOUNTER — Ambulatory Visit (INDEPENDENT_AMBULATORY_CARE_PROVIDER_SITE_OTHER): Payer: 59 | Admitting: Family Medicine

## 2014-07-09 ENCOUNTER — Encounter: Payer: Self-pay | Admitting: Family Medicine

## 2014-07-09 VITALS — BP 112/73 | HR 78 | Temp 97.9°F | Ht 66.0 in | Wt 376.0 lb

## 2014-07-09 DIAGNOSIS — N920 Excessive and frequent menstruation with regular cycle: Secondary | ICD-10-CM | POA: Diagnosis not present

## 2014-07-09 DIAGNOSIS — B351 Tinea unguium: Secondary | ICD-10-CM

## 2014-07-09 DIAGNOSIS — Z6841 Body Mass Index (BMI) 40.0 and over, adult: Secondary | ICD-10-CM | POA: Diagnosis not present

## 2014-07-09 DIAGNOSIS — N3941 Urge incontinence: Secondary | ICD-10-CM

## 2014-07-09 LAB — HEPATIC FUNCTION PANEL
ALBUMIN: 3.8 g/dL (ref 3.5–5.2)
ALT: 19 U/L (ref 0–35)
AST: 14 U/L (ref 0–37)
Alkaline Phosphatase: 88 U/L (ref 39–117)
Bilirubin, Direct: 0.1 mg/dL (ref 0.0–0.3)
Indirect Bilirubin: 0.2 mg/dL (ref 0.2–1.2)
TOTAL PROTEIN: 7.3 g/dL (ref 6.0–8.3)
Total Bilirubin: 0.3 mg/dL (ref 0.2–1.2)

## 2014-07-09 MED ORDER — TERBINAFINE HCL 250 MG PO TABS: 250.0000 mg | ORAL_TABLET | Freq: Every day | ORAL | Status: DC

## 2014-07-09 MED ORDER — LEVONORGEST-ETH ESTRAD 91-DAY 0.15-0.03 MG PO TABS: 1.0000 | ORAL_TABLET | Freq: Every day | ORAL | Status: DC

## 2014-07-09 NOTE — Assessment & Plan Note (Signed)
Not improved by myrbetriq, declines discussion of other medications or referral.

## 2014-07-09 NOTE — Assessment & Plan Note (Signed)
Stable. Now declines seasonale despite it having been effective. Refuses IUD, nexplanon (problems in past).

## 2014-07-09 NOTE — Assessment & Plan Note (Signed)
Lost terbinafine Rx after 30 days therapy with improvement but still evidence of infection. Will recheck LFTs and Rx 3 months of terbinafine. F/u 3 months

## 2014-07-09 NOTE — Progress Notes (Signed)
Subjective: Kathleen Andrews is a 29 y.o. female presenting for follow up.  Urinary incontinence: still with symptoms, she reports worsened on myrbetriq so stopped this after about 6 weeks. Drinks less to avoid accidents and urinates 1 - 2 times while at work for 10 hours. Wears depends at times. Also with some leakage when coughing, sneezing. Tried kegel exercises without improvement. This problem is stable, constant.   Foot fungus: got better during the first 30 days but she lost the medication prior to completing 3 months and still has some symptoms.   Obesity: declines discussion about her weight.   Menorrhagia: No change in symptoms, though they were improved with seasonale she stopped taking it.   For many problems she tells me that she does not want to take medications (incontinence, obesity, etc.) and that she does not want a referral if surgery would be the primary indication (e.g. urology, bariatric surgery).   - ROS: Denies fever, headache, cough, CP, palpitations, SOB, abdominal pain, N/V/D, blood in stool, myalgias, arthralgias, and rash.  - Nonsmoker  Objective: BP 112/73 mmHg  Pulse 78  Temp(Src) 97.9 F (36.6 C) (Oral)  Ht 5\' 6"  (1.676 m)  Wt 376 lb (170.552 kg)  BMI 60.72 kg/m2  LMP 07/08/2014 Gen: Morbidly obese well-appearing 29 y.o. female in no distress CV: RRR, no MRG, no JVD, 2+ radial and DP pulses bilaterally Resp: Non-labored breathing ambient air, CTAB, no wheezes noted Abd: Soft, NTND, BS present, no guarding or organomegaly; very obese Neuro: Alert and oriented, speech normal  Assessment/Plan: Kathleen Andrews is a 28 y.o. female here for follow up.  See problem list for plan.

## 2014-07-09 NOTE — Assessment & Plan Note (Signed)
Counseling provided

## 2014-07-12 ENCOUNTER — Encounter: Payer: Self-pay | Admitting: Family Medicine

## 2014-09-06 ENCOUNTER — Encounter (HOSPITAL_BASED_OUTPATIENT_CLINIC_OR_DEPARTMENT_OTHER): Payer: Self-pay

## 2014-09-06 ENCOUNTER — Emergency Department (HOSPITAL_BASED_OUTPATIENT_CLINIC_OR_DEPARTMENT_OTHER)
Admission: EM | Admit: 2014-09-06 | Discharge: 2014-09-06 | Disposition: A | Discharge status: Home / Self Care | Payer: 59 | Attending: Emergency Medicine | Admitting: Emergency Medicine

## 2014-09-06 DIAGNOSIS — Z87440 Personal history of urinary (tract) infections: Secondary | ICD-10-CM | POA: Insufficient documentation

## 2014-09-06 DIAGNOSIS — Z8742 Personal history of other diseases of the female genital tract: Secondary | ICD-10-CM | POA: Insufficient documentation

## 2014-09-06 DIAGNOSIS — F419 Anxiety disorder, unspecified: Secondary | ICD-10-CM | POA: Diagnosis not present

## 2014-09-06 DIAGNOSIS — Z87442 Personal history of urinary calculi: Secondary | ICD-10-CM | POA: Diagnosis not present

## 2014-09-06 DIAGNOSIS — Z862 Personal history of diseases of the blood and blood-forming organs and certain disorders involving the immune mechanism: Secondary | ICD-10-CM | POA: Insufficient documentation

## 2014-09-06 DIAGNOSIS — Z91040 Latex allergy status: Secondary | ICD-10-CM | POA: Diagnosis not present

## 2014-09-06 DIAGNOSIS — H6093 Unspecified otitis externa, bilateral: Secondary | ICD-10-CM | POA: Insufficient documentation

## 2014-09-06 DIAGNOSIS — N189 Chronic kidney disease, unspecified: Secondary | ICD-10-CM | POA: Insufficient documentation

## 2014-09-06 DIAGNOSIS — Z85830 Personal history of malignant neoplasm of bone: Secondary | ICD-10-CM | POA: Diagnosis not present

## 2014-09-06 DIAGNOSIS — Z8679 Personal history of other diseases of the circulatory system: Secondary | ICD-10-CM | POA: Insufficient documentation

## 2014-09-06 DIAGNOSIS — E669 Obesity, unspecified: Secondary | ICD-10-CM | POA: Diagnosis not present

## 2014-09-06 DIAGNOSIS — H9201 Otalgia, right ear: Secondary | ICD-10-CM | POA: Diagnosis present

## 2014-09-06 MED ORDER — CIPROFLOXACIN-DEXAMETHASONE 0.3-0.1 % OT SUSP
4.0000 [drp] | Freq: Once | OTIC | Status: AC
  Administered 2014-09-06: 4 [drp] via OTIC
  Filled 2014-09-06: qty 7.5

## 2014-09-06 MED ORDER — IBUPROFEN 800 MG PO TABS
800.0000 mg | ORAL_TABLET | Freq: Once | ORAL | Status: AC
  Administered 2014-09-06: 800 mg via ORAL
  Filled 2014-09-06: qty 1

## 2014-09-06 NOTE — Discharge Instructions (Signed)
Use ciprodex 4 drops to both ears twice daily for a week.   Take motrin for pain.  Stay hydrated.   Avoid getting water into your ear.   Follow up with your doctor.   Return to ER if you have worse ear pain, fever, purulent drainage from your ear.

## 2014-09-06 NOTE — ED Notes (Signed)
Pt reports right ear pain x 3 days with it "feeling wet inside".  Denies additional symptoms.

## 2014-09-06 NOTE — ED Provider Notes (Signed)
CSN: [id number]     Arrival date & time 09/06/14  0919 History   First MD Initiated Contact with Patient 09/06/14 [phone]     Chief Complaint  Patient presents with  . Otalgia     (Consider location/radiation/quality/duration/timing/severity/associated sxs/prior Treatment) The history is provided by the patient.  Kathleen Andrews is a 29 y.o. female  History of UTI,  Otitis externa here presenting with right ear pain.  Right-sided ear pain for the last 3 days.  She noticed some clear discharge from the ear.  Denies any fevers or chills.  She states that she has not been swimming.  She said that the wind hurts her ear.    Past Medical History  Diagnosis Date  . Urinary tract bacterial infections   . OBESITY, NOS   . Urge urinary incontinence   . Recurrent UTI   . Axillary adenopathy   . Amenorrhea due to oral contraceptive   .  Supervision of high-risk pregnancy in first trimester   . Tachycardia   . Previous cesarean delivery, antepartum condition or complication   . Congenital heart disease, fetal, affecting care of mother, antepartum-in previous child   . Obesity complicating peripregnancy, antepartum   . History of IUGR (intrauterine growth retardation) and stillbirth, currently pregnant   . Pregnancy complicated by previous recurrent miscarriages   . Dysrhythmia     pregnancy related irregular and rapid heart rate, has followed up with cardiologist  . Shortness of breath   . Anxiety   . Depression   . Chronic kidney disease     history of kidney stones  . GERD (gastroesophageal reflux disease)     with pregnancy  . Headache(784.0)     migraines  . Cancer     ankle bone area removed  . Anemia     history after pregnancy  . Complication of anesthesia     low blood pressure with general anesthesia   Past Surgical History  Procedure Laterality Date  . Ankle surgery Right 2012    cancer  . Cesarean section      X 2 - failure to progress and repeat elective  . Cesarean  section with bilateral tubal ligation N/A 07/03/2013    Procedure: REPEAT CESAREAN SECTION X 3 WITH BILATERAL TUBAL LIGATION;  Surgeon: [name], MD;  Location: [facility];  Service: Obstetrics;  Laterality: N/A;  Dr [name] request CSE anesthesia, also requested to be in room for taping/positioning of patient. 5/25 TWD   Family History  Problem Relation Age of Onset  . Cancer Other   . Hypertension Other   . Hyperlipidemia Other   . Blindness Other   . Deafness Other   . Diabetes Father   . Heart disease Other     mother adopted but reported strong history   History  Substance Use Topics  . Smoking status: Never Smoker   . Smokeless tobacco: Never Used  . Alcohol Use: No   OB History    Gravida Para Term Preterm AB TAB SAB Ectopic Multiple Living   10 4 4  0 6 1 5  0 0 3     Review of Systems  HENT: Positive for ear pain.   All other systems reviewed and are negative.     Allergies  Tomato; Eggs or egg-derived products; and Latex  Home Medications   Prior to Admission medications   Medication Sig Start Date End Date Taking? Authorizing Provider  acetaminophen (TYLENOL) 650 MG CR tablet Take 650 mg  by mouth every 8 (eight) hours as needed for pain.    Historical Provider, MD  calcium carbonate (TUMS - DOSED IN MG ELEMENTAL CALCIUM) 500 MG chewable tablet Chew 1 tablet by mouth 3 (three) times daily as needed for indigestion or heartburn.    Historical Provider, MD  diazepam (VALIUM) 5 MG tablet Take 1 tablet (5 mg total) by mouth once. Take 1 hour prior to your appointment 07/06/13   Kassie Mends, MD  levonorgestrel-ethinyl estradiol (SEASONALE) 0.15-0.03 MG tablet Take 1 tablet by mouth daily. 07/09/14   [name], MD  terbinafine (LAMISIL) 250 MG tablet Take 1 tablet (250 mg total) by mouth daily. 07/09/14   [name], MD   BP 152/99 mmHg  Pulse 91  Temp(Src) 98.6 F (37 C)  Resp 18  Ht 5\' 6"  (1.676 m)  Wt 385 lb (174.635 kg)  BMI 62.17 kg/m2  SpO2 99%  LMP  09/05/2014 Physical Exam  Constitutional: She is oriented to person, place, and time.  Uncomfortable   HENT:  Head: Normocephalic.  Mouth/Throat: Oropharynx is clear and moist.  Mild R otitis externa, R TM nl. No mastoid tenderness. L TM nl, ? Mild L otitis externa   Eyes: Conjunctivae are normal. Pupils are equal, round, and reactive to light.  Neck: Normal range of motion. Neck supple.  Cardiovascular: Normal rate, regular rhythm and normal heart sounds.   Pulmonary/Chest: Effort normal and breath sounds normal. No respiratory distress. She has no wheezes.  Abdominal: Soft. Bowel sounds are normal. She exhibits no distension. There is no tenderness.  Musculoskeletal: Normal range of motion.  Neurological: She is alert and oriented to person, place, and time.  Skin: Skin is warm and dry.  Psychiatric: She has a normal mood and affect. Her behavior is normal. Judgment and thought content normal.  Nursing note and vitals reviewed.   ED Course  Procedures (including critical care time) Labs Review Labs Reviewed - No data to display  Imaging Review No results found.   EKG Interpretation None      MDM   Final diagnoses:  None    Kathleen Andrews is a 29 y.o. female here with mild bilateral otitis externa, worse on R side. No signs of malignant otitis externa or otitis media or mastoiditis. Given ciprodex, motrin. Recommend avoid water in the ear and gave strict return precautions.    Wandra Arthurs, MD 09/06/14 1026

## 2014-09-07 ENCOUNTER — Telehealth: Payer: Self-pay | Admitting: Family Medicine

## 2014-09-07 MED ORDER — CIPROFLOXACIN-HYDROCORTISONE 0.2-1 % OT SUSP: 3.0000 [drp] | Freq: Two times a day (BID) | OTIC | Status: DC

## 2014-09-07 MED ORDER — DEXAMETHASONE 0.1 % OP SUSP
2.0000 [drp] | Freq: Two times a day (BID) | OPHTHALMIC | Status: DC
Start: 2014-09-07 — End: 2014-09-07

## 2014-09-07 NOTE — Telephone Encounter (Signed)
Called back by patient. No pharmacy has decadron op soln in stock. Advised to d/c ciprodex. Will prescribe cirp-hydrocortisone otic solution 3 drops in each ear. Patient voiced understanding

## 2014-09-07 NOTE — Telephone Encounter (Signed)
Patient seen in the ED yesterday and diagnosed with mild b/l otitis externa without complications or otitis media/mastoiditis. She feels the pain is stable, tearful as it has not improved. Has been using ciprodex prescribed by the ED. No drainage from the ear.  Throat and neck pain stable. Currently taking Ibuprofen 650mg  q6hrs without improvement.  Discussed that ciprodex can take a while to improve. Ear pain is stable without worsening or new symptoms. Pt notes she's been using Tylenol as well.  -Will prescribe dexamethasone 0.1% opthalmic soln: 2 drops int he ears BID to help with pain. If worsening symptoms, advised to stop and call.  -Return precautions dicussed.  - Pt to follow up in clinic.   Archie Patten, MD [facility] Resident  09/07/2014, 1:23 PM

## 2014-09-09 ENCOUNTER — Encounter: Payer: Self-pay | Admitting: Family Medicine

## 2014-09-09 ENCOUNTER — Ambulatory Visit (INDEPENDENT_AMBULATORY_CARE_PROVIDER_SITE_OTHER): Payer: 59 | Admitting: Family Medicine

## 2014-09-09 VITALS — BP 146/84 | HR 81 | Temp 98.6°F | Ht 66.0 in

## 2014-09-09 DIAGNOSIS — B368 Other specified superficial mycoses: Secondary | ICD-10-CM

## 2014-09-09 DIAGNOSIS — Z6841 Body Mass Index (BMI) 40.0 and over, adult: Secondary | ICD-10-CM

## 2014-09-09 DIAGNOSIS — H60393 Other infective otitis externa, bilateral: Secondary | ICD-10-CM | POA: Diagnosis not present

## 2014-09-09 DIAGNOSIS — B369 Superficial mycosis, unspecified: Secondary | ICD-10-CM

## 2014-09-09 DIAGNOSIS — H60399 Other infective otitis externa, unspecified ear: Secondary | ICD-10-CM | POA: Insufficient documentation

## 2014-09-09 DIAGNOSIS — N920 Excessive and frequent menstruation with regular cycle: Secondary | ICD-10-CM

## 2014-09-09 DIAGNOSIS — H628X1 Other disorders of right external ear in diseases classified elsewhere: Secondary | ICD-10-CM

## 2014-09-09 DIAGNOSIS — H6241 Otitis externa in other diseases classified elsewhere, right ear: Secondary | ICD-10-CM

## 2014-09-09 MED ORDER — CIPROFLOXACIN HCL 500 MG PO TABS: 500.0000 mg | ORAL_TABLET | Freq: Two times a day (BID) | ORAL | Status: DC

## 2014-09-09 NOTE — Assessment & Plan Note (Addendum)
Upreg, CBC. Likely due to seasonale vs. primary anovulation vs. structural uterine pathology. Doubt neoplasia. Will not pursue imaging at this time. Recommended to continue seasonale as directed for several months and not discontinuing it before discussing with a doctor.

## 2014-09-09 NOTE — Progress Notes (Signed)
Subjective: Kathleen Andrews is a 29 y.o. female patient of mine returning for menorrhagia.   She reports mild (wore no pads) intermittent spotting over the past 3 weeks without a discrete period. This caused her to stop taking seasonale 2 days ago and she thinks she started a normal period today. She denies fevers, weight loss, night sweats, dizziness, vertigo, palpitations, dyspnea, chest pain. She has has a BTL and takes OCPs just to control menses. She denies history of fibroids, bleeding disorders.   Also has bilateral ear pain and history of otitis externa. Has not gotten much better with otic drops prescribed by the ED. Wants oral antibiotics. Again, no fevers. Does not swim.  Objective: BP 146/84 mmHg  Pulse 81  Temp(Src) 98.6 F (37 C) (Oral)  Ht 5\' 6"  (1.676 m)  Wt   LMP 09/05/2014 Gen: Morbidly obese 28 y.o. female in no distress HEENT: Conjunctivae normal, moist mucous membranes, L ear with intense erythema in canal and pearly grey TM without effusion. R ear with intense erythema in canal with normal TM. +Right mastoid tenderness.  CV: Regular rate, no murmur; radial, DP and PT pulses 2+ bilaterally; no pitting LE edema Pelvic: Normal external female genitalia without lesions. Vaginal mucosa and cervix normal without lesions. No discharge but + cervical bleeding noted on speculum exam. No cervical motion tenderness.  - CMA present throughout duration of exam.   Assessment/Plan: Kathleen Andrews is a 29 y.o. female here for menorrhagia and otitis externa.  See problem list for plan.

## 2014-09-12 NOTE — Assessment & Plan Note (Signed)
Again discussed role the obesity is playing in irregular menstrual bleeding. Declines consideration of medical and surgical therapy. Discussed lifestyle briefly.

## 2014-09-12 NOTE — Assessment & Plan Note (Signed)
Somewhat responsive to topical therapy but not as effectively as I would like with continued mastoid tenderness and no fever. Will Rx po abx and urged follow up if any worsening or failure to improve.

## 2014-09-13 ENCOUNTER — Telehealth: Payer: Self-pay | Admitting: Family Medicine

## 2014-09-13 NOTE — Telephone Encounter (Signed)
Rx'd antibiotics for ear infection on Monday, they are still not working. Would like to know what else can be done. Thank you, Kathleen Andrews, ASA

## 2014-09-16 NOTE — Telephone Encounter (Signed)
I tried to call Kathleen Andrews without answer. If she calls back please let her know that we may need to refer to ENT for otitis externa and I will be able to look again at her ears tomorrow when she comes to her son's appt.

## 2014-09-17 ENCOUNTER — Encounter: Payer: Self-pay | Admitting: Family Medicine

## 2014-09-17 DIAGNOSIS — B369 Superficial mycosis, unspecified: Secondary | ICD-10-CM | POA: Insufficient documentation

## 2014-09-17 DIAGNOSIS — H6241 Otitis externa in other diseases classified elsewhere, right ear: Secondary | ICD-10-CM

## 2014-09-17 MED ORDER — MICONAZOLE NITRATE 2 % EX CREA: 1.0000 "application " | TOPICAL_CREAM | Freq: Two times a day (BID) | CUTANEOUS | Status: DC

## 2014-09-17 MED ORDER — IBUPROFEN 600 MG PO TABS: 600.0000 mg | ORAL_TABLET | Freq: Four times a day (QID) | ORAL | Status: DC | PRN

## 2014-09-17 NOTE — Addendum Note (Signed)
Addended by: Vance Gather B on: 09/17/2014 05:16 PM   Modules accepted: Orders

## 2014-09-18 ENCOUNTER — Emergency Department (HOSPITAL_BASED_OUTPATIENT_CLINIC_OR_DEPARTMENT_OTHER)
Admission: EM | Admit: 2014-09-18 | Discharge: 2014-09-18 | Disposition: A | Discharge status: Home / Self Care | Payer: 59 | Attending: Emergency Medicine | Admitting: Emergency Medicine

## 2014-09-18 ENCOUNTER — Encounter (HOSPITAL_BASED_OUTPATIENT_CLINIC_OR_DEPARTMENT_OTHER): Payer: Self-pay | Admitting: Emergency Medicine

## 2014-09-18 DIAGNOSIS — H6091 Unspecified otitis externa, right ear: Secondary | ICD-10-CM | POA: Insufficient documentation

## 2014-09-18 DIAGNOSIS — Z859 Personal history of malignant neoplasm, unspecified: Secondary | ICD-10-CM | POA: Diagnosis not present

## 2014-09-18 DIAGNOSIS — Z8719 Personal history of other diseases of the digestive system: Secondary | ICD-10-CM | POA: Diagnosis not present

## 2014-09-18 DIAGNOSIS — Z91040 Latex allergy status: Secondary | ICD-10-CM | POA: Diagnosis not present

## 2014-09-18 DIAGNOSIS — H9201 Otalgia, right ear: Secondary | ICD-10-CM | POA: Diagnosis present

## 2014-09-18 DIAGNOSIS — Z8742 Personal history of other diseases of the female genital tract: Secondary | ICD-10-CM | POA: Insufficient documentation

## 2014-09-18 DIAGNOSIS — F419 Anxiety disorder, unspecified: Secondary | ICD-10-CM | POA: Insufficient documentation

## 2014-09-18 DIAGNOSIS — Z862 Personal history of diseases of the blood and blood-forming organs and certain disorders involving the immune mechanism: Secondary | ICD-10-CM | POA: Diagnosis not present

## 2014-09-18 DIAGNOSIS — N189 Chronic kidney disease, unspecified: Secondary | ICD-10-CM | POA: Diagnosis not present

## 2014-09-18 DIAGNOSIS — Z87440 Personal history of urinary (tract) infections: Secondary | ICD-10-CM | POA: Diagnosis not present

## 2014-09-18 DIAGNOSIS — Z8679 Personal history of other diseases of the circulatory system: Secondary | ICD-10-CM | POA: Insufficient documentation

## 2014-09-18 DIAGNOSIS — Z8619 Personal history of other infectious and parasitic diseases: Secondary | ICD-10-CM | POA: Diagnosis not present

## 2014-09-18 DIAGNOSIS — E669 Obesity, unspecified: Secondary | ICD-10-CM | POA: Insufficient documentation

## 2014-09-18 MED ORDER — FLUCONAZOLE 150 MG PO TABS: 150.0000 mg | ORAL_TABLET | Freq: Once | ORAL | Status: DC

## 2014-09-18 NOTE — ED Notes (Signed)
Triage info entered from paper downtime forms.  Please see paper charting and DC information.

## 2014-09-18 NOTE — ED Provider Notes (Signed)
CSN: [id number]     Arrival date & time 09/18/14  0101 History   None    Chief Complaint  Patient presents with  . Otalgia     (Consider location/radiation/quality/duration/timing/severity/associated sxs/prior Treatment) Patient is a 29 y.o. female presenting with ear pain. The history is provided by the patient.  Otalgia Location:  Right Behind ear:  No abnormality Quality:  Aching Severity:  Severe Onset quality:  Sudden Duration:  3 weeks Timing:  Constant Progression:  Unchanged Chronicity:  New Context: not direct blow   Relieved by:  Nothing Worsened by:  Nothing tried Ineffective treatments: oral and ear drops and ibuprofen. Associated symptoms: no ear discharge, no fever, no neck pain, no rhinorrhea and no vomiting   Risk factors: no recent travel     Past Medical History  Diagnosis Date  . Urinary tract bacterial infections   . OBESITY, NOS   . Urge urinary incontinence   . Recurrent UTI   . Axillary adenopathy   . Amenorrhea due to oral contraceptive   .  Supervision of high-risk pregnancy in first trimester   . Tachycardia   . Previous cesarean delivery, antepartum condition or complication   . Congenital heart disease, fetal, affecting care of mother, antepartum-in previous child   . Obesity complicating peripregnancy, antepartum   . History of IUGR (intrauterine growth retardation) and stillbirth, currently pregnant   . Pregnancy complicated by previous recurrent miscarriages   . Dysrhythmia     pregnancy related irregular and rapid heart rate, has followed up with cardiologist  . Shortness of breath   . Anxiety   . Depression   . Chronic kidney disease     history of kidney stones  . GERD (gastroesophageal reflux disease)     with pregnancy  . Headache(784.0)     migraines  . Cancer     ankle bone area removed  . Anemia     history after pregnancy  . Complication of anesthesia     low blood pressure with general anesthesia   Past Surgical  History  Procedure Laterality Date  . Ankle surgery Right 2012    cancer  . Cesarean section      X 2 - failure to progress and repeat elective  . Cesarean section with bilateral tubal ligation N/A 07/03/2013    Procedure: REPEAT CESAREAN SECTION X 3 WITH BILATERAL TUBAL LIGATION;  Surgeon: [name], MD;  Location: [facility];  Service: Obstetrics;  Laterality: N/A;  Dr [name] request CSE anesthesia, also requested to be in room for taping/positioning of patient. 5/25 TWD   Family History  Problem Relation Age of Onset  . Cancer Other   . Hypertension Other   . Hyperlipidemia Other   . Blindness Other   . Deafness Other   . Diabetes Father   . Heart disease Other     mother adopted but reported strong history   Social History  Substance Use Topics  . Smoking status: Never Smoker   . Smokeless tobacco: Never Used  . Alcohol Use: No   OB History    Gravida Para Term Preterm AB TAB SAB Ectopic Multiple Living   10 4 4  0 6 1 5  0 0 3     Review of Systems  Constitutional: Negative for fever.  HENT: Positive for ear pain. Negative for drooling, ear discharge and rhinorrhea.   Gastrointestinal: Negative for vomiting.  Musculoskeletal: Negative for neck pain.  All other systems reviewed and are negative.  Allergies  Tomato; Eggs or egg-derived products; and Latex  Home Medications   Prior to Admission medications   Medication Sig Start Date End Date Taking? Authorizing Provider  acetaminophen (TYLENOL) 650 MG CR tablet Take 650 mg by mouth every 8 (eight) hours as needed for pain.    Historical Provider, MD  calcium carbonate (TUMS - DOSED IN MG ELEMENTAL CALCIUM) 500 MG chewable tablet Chew 1 tablet by mouth 3 (three) times daily as needed for indigestion or heartburn.    Historical Provider, MD  ciprofloxacin (CIPRO) 500 MG tablet Take 1 tablet (500 mg total) by mouth 2 (two) times daily. 09/09/14   [name], MD  ciprofloxacin-hydrocortisone (CIPRO [facility]) otic  suspension Place 3 drops into both ears 2 (two) times daily. 09/07/14   Archie Patten, MD  diazepam (VALIUM) 5 MG tablet Take 1 tablet (5 mg total) by mouth once. Take 1 hour prior to your appointment 07/06/13   Kassie Mends, MD  ibuprofen (ADVIL,MOTRIN) 600 MG tablet Take 1 tablet (600 mg total) by mouth every 6 (six) hours as needed for moderate pain. 09/17/14   [name], MD  levonorgestrel-ethinyl estradiol (SEASONALE) 0.15-0.03 MG tablet Take 1 tablet by mouth daily. 07/09/14   [name], MD  miconazole (MICATIN) 2 % cream Apply 1 application topically 2 (two) times daily. 09/17/14   [name], MD   BP 156/109 mmHg  Pulse 88  Temp(Src) 98.3 F (36.8 C) (Oral)  Resp 20  SpO2 99%  LMP 09/11/2014 Physical Exam  Constitutional: She is oriented to person, place, and time. She appears well-developed and well-nourished. No distress.  HENT:  Head: Normocephalic and atraumatic.  Right Ear: No mastoid tenderness. Tympanic membrane is not injected, not perforated, not erythematous, not retracted and not bulging. No hemotympanum.  Left Ear: External ear and ear canal normal. No mastoid tenderness. Tympanic membrane is not injected, not perforated, not erythematous, not retracted and not bulging. No hemotympanum.  Mouth/Throat: Oropharynx is clear and moist.  Minimal debris on the floor of the right canal.    Eyes: Conjunctivae are normal. Pupils are equal, round, and reactive to light.  Neck: Normal range of motion. Neck supple.  Cardiovascular: Normal rate, regular rhythm, normal heart sounds and intact distal pulses.   Pulmonary/Chest: Effort normal and breath sounds normal. She has no wheezes. She has no rales.  Abdominal: Soft. Bowel sounds are normal. There is no tenderness. There is no rebound and no guarding.  Musculoskeletal: Normal range of motion.  Neurological: She is alert and oriented to person, place, and time.  Skin: Skin is warm and dry.  Psychiatric: She has a normal mood  and affect.    ED Course  Procedures (including critical care time) Labs Review Labs Reviewed - No data to display  Imaging Review No results found.   EKG Interpretation None      MDM   Final diagnoses:  Otitis externa, right   No signs of mastoiditis.  Ear is not down nor out, no bulge behind ear, no tenderness with palpation.  Minimal debris in the canal.    Will add ciprodex and change Ibuprofen to mobic.  Given the ongoing symptoms in the setting of both PO antibiotics and ear drops will refer to ENT for further evaluation.   Patient verbalizes understanding and agrees to follow up    Teniola Tseng, MD 09/18/14 1761

## 2014-09-18 NOTE — Telephone Encounter (Signed)
Pt informed. Zimmerman Rumple, April D, CMA  

## 2014-09-18 NOTE — ED Notes (Signed)
Pt having right ear pain x3 weeks. Seen here 2 wks ago for same.  Has completed abx. Sts she saw pmd yesterday at 1700 and ever since he touched her ear it has been unbearably painful. Pt is tearful.

## 2014-09-18 NOTE — Telephone Encounter (Signed)
Fluconazole sent to [facility] at [facility] for antibiotic-associated candida. I see that she was referred to ENT from the ED. I agree with this if she's still having symptoms.

## 2014-09-18 NOTE — Telephone Encounter (Signed)
Patient called stating the pharmacy did not received the Rx miconazole.  [facility] was [facility] they stated that the prescription was on hold.  Told them to go ahead and fill the Rx because the patient is requesting to have that filled today.  Patient also requested a Rx for diflucan, from the recent antibiotic Cipro.  Please advise if Rx for the diflucan will be sent to [facility] in [place].  Derl Barrow, RN

## 2015-02-05 ENCOUNTER — Ambulatory Visit (INDEPENDENT_AMBULATORY_CARE_PROVIDER_SITE_OTHER): Payer: 59 | Admitting: Family Medicine

## 2015-02-05 ENCOUNTER — Encounter: Payer: Self-pay | Admitting: Family Medicine

## 2015-02-05 VITALS — BP 170/48 | HR 69 | Temp 98.4°F | Ht 66.0 in | Wt >= 6400 oz

## 2015-02-05 DIAGNOSIS — Z0000 Encounter for general adult medical examination without abnormal findings: Secondary | ICD-10-CM

## 2015-02-05 DIAGNOSIS — Z6841 Body Mass Index (BMI) 40.0 and over, adult: Secondary | ICD-10-CM

## 2015-02-05 LAB — LIPID PANEL
CHOL/HDL RATIO: 2.6 ratio (ref <=5.0)
Cholesterol: 102 mg/dL — ABNORMAL LOW (ref 125–200)
HDL: 40 mg/dL — ABNORMAL LOW (ref >=46)
LDL Cholesterol: 47 mg/dL (ref <130)
Triglycerides: 76 mg/dL (ref <150)
VLDL: 15 mg/dL (ref <30)

## 2015-02-05 NOTE — Progress Notes (Signed)
Waist circumference 58in.Kathleen Andrews, [name]

## 2015-02-05 NOTE — Progress Notes (Signed)
  Subjective:   Kathleen Andrews is a 29 y.o. female here for an annual gynecological exam.  Does not exercise regularly and endorses a poor diet.   Takes seasonale for contraception, denies heavy bleeding/discharge. In monogamous relationship with her husband, declines STI screening. Denies dyspareunia, dysuria. No breast mass or concerns.   Last pap smear: 2015, normal  History of abnormal pap smear: No   - FH of breast, uterine, ovarian, colon cancer: No - Non smoker, drinks less than monthly, never more than one drink, denies illicit drugs, feels safe in relationships, PHQ-2 negative.   - Review of Systems: Per HPI. All other systems reviewed and are negative. - Past medical, family and social histories as well as medications were reviewed and updated.  Objective:  BP 170/48 mmHg  Pulse 69  Temp(Src) 98.4 F (36.9 C) (Oral)  Ht 5\' 6"  (1.676 m)  Wt 415 lb (188.243 kg)  BMI 67.01 kg/m2 Gen:  29 y.o. female in NAD HEENT: MMM, EOMI, PERRL, anicteric sclerae, nares patent, oropharynx clear, no thyromegaly CV: RRR, no MRG, no JVD, 2+ radial and DP pulses bilaterally Resp: Non-labored breathing ambient air, CTAB, no wheezes noted Breasts: Appear normal, no suspicious masses, no skin or nipple changes or axillary nodes on palpation. Abd: Soft, NTND, BS present, no guarding or organomegaly Neuro: Alert and oriented, speech normal Pelvic: Deferred per pt request.     Chemistry      Component Value Date/Time   NA 140 12/24/2013 1648   K 4.1 12/24/2013 1648   CL 100 12/24/2013 1648   CO2 26 12/24/2013 1648   BUN 10 12/24/2013 1648   CREATININE 0.71 12/24/2013 1648   CREATININE 0.62 07/03/2013 1625      Component Value Date/Time   CALCIUM 9.4 12/24/2013 1648   ALKPHOS 88 07/09/2014 1455   AST 14 07/09/2014 1455   ALT 19 07/09/2014 1455   BILITOT 0.3 07/09/2014 1455     Lab Results  Component Value Date   WBC 8.7 12/24/2013   HGB 11.8* 12/24/2013   HCT 38.4 12/24/2013   MCV 74.6* 12/24/2013   PLT 294 12/24/2013   Lab Results  Component Value Date   TSH 0.74 12/18/2012   Lab Results  Component Value Date   HGBA1C 5.6 12/24/2013   Lab Results  Component Value Date   CHOL 122 12/24/2013   HDL 48 12/24/2013   LDLCALC 56 12/24/2013   TRIG 92 12/24/2013   CHOLHDL 2.5 12/24/2013   Assessment/Plan:  Kathleen Andrews is a 29 y.o. female here for annual gynecologic exam.   Health maintenance:  - Pap smear: Not due until 2018.  - Vaccinations: Again declined flu shot.  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  - Follow up in 1 year  Obesity: Discussed healthy lifestyle choices. Declines referral for MNT or bariatric surgery evaluation.

## 2015-02-05 NOTE — Patient Instructions (Signed)
Thank you for coming in today!  We are checking some labs today, and we'll fill out the information on the form you provide. You are caught up on healthacre maintenance, though you will be due for a pap smear in 2018.   Our clinic's number is [phone]. Feel free to call any time with questions or concerns. We will answer any questions after hours with our 24-hour emergency line at that number as well.   Things to do to keep yourself healthy: - Be active at least 30 minutes a day,  4-5 days a week.  - Eat a diet with lots of fruits and vegetables, up to 7-9 servings per day.  - Seatbelts can save your life. Wear them always. - Smoke detectors on every level of your home, check batteries every year. - Safe sex: if you may be exposed to STDs, use a condom. - Alcohol: If you drink, do so in moderation, 1-2 drinks per day at the most. - [place]: Choose someone to speak for you if you are not able. - Depression is common in our stressful world. If you're feeling down or losing interest in things you normally enjoy, please come in for a visit. - Violence: If anyone is threatening or hurting you, please call immediately. You are not alone.   - Dr. Bonner Puna

## 2015-06-08 ENCOUNTER — Emergency Department (HOSPITAL_BASED_OUTPATIENT_CLINIC_OR_DEPARTMENT_OTHER): Payer: 59

## 2015-06-08 ENCOUNTER — Encounter (HOSPITAL_BASED_OUTPATIENT_CLINIC_OR_DEPARTMENT_OTHER): Payer: Self-pay | Admitting: *Deleted

## 2015-06-08 ENCOUNTER — Emergency Department (HOSPITAL_BASED_OUTPATIENT_CLINIC_OR_DEPARTMENT_OTHER)
Admission: EM | Admit: 2015-06-08 | Discharge: 2015-06-08 | Disposition: A | Discharge status: Home / Self Care | Payer: 59 | Attending: Emergency Medicine | Admitting: Emergency Medicine

## 2015-06-08 DIAGNOSIS — M25562 Pain in left knee: Secondary | ICD-10-CM | POA: Insufficient documentation

## 2015-06-08 DIAGNOSIS — N189 Chronic kidney disease, unspecified: Secondary | ICD-10-CM | POA: Diagnosis not present

## 2015-06-08 DIAGNOSIS — Z85830 Personal history of malignant neoplasm of bone: Secondary | ICD-10-CM | POA: Insufficient documentation

## 2015-06-08 DIAGNOSIS — F329 Major depressive disorder, single episode, unspecified: Secondary | ICD-10-CM | POA: Insufficient documentation

## 2015-06-08 DIAGNOSIS — E669 Obesity, unspecified: Secondary | ICD-10-CM | POA: Insufficient documentation

## 2015-06-08 MED ORDER — IBUPROFEN 400 MG PO TABS
600.0000 mg | ORAL_TABLET | Freq: Once | ORAL | Status: AC
  Administered 2015-06-08: 600 mg via ORAL
  Filled 2015-06-08: qty 1

## 2015-06-08 NOTE — ED Notes (Signed)
3 days of left knee pain and swelling

## 2015-06-08 NOTE — ED Provider Notes (Signed)
CSN: [id number]     Arrival date & time 06/08/15  2045 History  By signing my name below, I, [name], attest that this documentation has been prepared under the direction and in the presence of [name], MD. Electronically Signed: [name], ED Scribe. 06/08/2015. 10:27 PM.    Chief Complaint  Patient presents with  . Knee Pain   The history is provided by the patient. No language interpreter was used.   HPI Comments: Kathleen Andrews is a 30 y.o. female who presents to the Emergency Department complaining of constant, mild, anterior left knee pain onset 3 days ago. She reports left knee swelling (posterior) and left foot swelling yesterday. She has not taken any medications or attempted any treatments. Denies paraesthesias, weakness, numbness. No injury.   Past Medical History  Diagnosis Date  . Urinary tract bacterial infections   . OBESITY, NOS   . Urge urinary incontinence   . Recurrent UTI   . Axillary adenopathy   . Amenorrhea due to oral contraceptive   .  Supervision of high-risk pregnancy in first trimester   . Tachycardia   . Previous cesarean delivery, antepartum condition or complication   . Congenital heart disease, fetal, affecting care of mother, antepartum-in previous child   . Obesity complicating peripregnancy, antepartum   . History of IUGR (intrauterine growth retardation) and stillbirth, currently pregnant   . Pregnancy complicated by previous recurrent miscarriages   . Dysrhythmia     pregnancy related irregular and rapid heart rate, has followed up with cardiologist  . Shortness of breath   . Anxiety   . Depression   . Chronic kidney disease     history of kidney stones  . GERD (gastroesophageal reflux disease)     with pregnancy  . Headache(784.0)     migraines  . Cancer ([place])     ankle bone area removed  . Anemia     history after pregnancy  . Complication of anesthesia     low blood pressure with general anesthesia    Past Surgical History  Procedure Laterality Date  . Ankle surgery Right 2012    cancer  . Cesarean section      X 2 - failure to progress and repeat elective  . Cesarean section with bilateral tubal ligation N/A 07/03/2013    Procedure: REPEAT CESAREAN SECTION X 3 WITH BILATERAL TUBAL LIGATION;  Surgeon: [name], MD;  Location: [facility];  Service: Obstetrics;  Laterality: N/A;  Dr [name] request CSE anesthesia, also requested to be in room for taping/positioning of patient. 5/25 TWD   Family History  Problem Relation Age of Onset  . Cancer Other   . Hypertension Other   . Hyperlipidemia Other   . Blindness Other   . Deafness Other   . Diabetes Father   . Heart disease Other     mother adopted but reported strong history   Social History  Substance Use Topics  . Smoking status: Never Smoker   . Smokeless tobacco: Never Used  . Alcohol Use: No   OB History    Gravida Para Term Preterm AB TAB SAB Ectopic Multiple Living   10 4 4  0 6 1 5  0 0 3     Review of Systems  Musculoskeletal: Positive for joint swelling and arthralgias.  Neurological: Negative for weakness and numbness.  All other systems reviewed and are negative.  Allergies  Tomato; Eggs or egg-derived products; and Latex  Home Medications   Prior  to Admission medications   Medication Sig Start Date End Date Taking? Authorizing Provider  levonorgestrel-ethinyl estradiol (SEASONALE) 0.15-0.03 MG tablet Take 1 tablet by mouth daily. 07/09/14   [name], MD   BP 118/76 mmHg  Pulse 77  Temp(Src) 98.3 F (36.8 C) (Oral)  Resp 18  Ht 5\' 6"  (1.676 m)  Wt 415 lb (188.243 kg)  BMI 67.01 kg/m2  SpO2 99%  LMP 05/11/2015 (Approximate) Physical Exam  Constitutional: She is oriented to person, place, and time. She appears well-developed and well-nourished.  Morbidly obese  HENT:  Head: Normocephalic and atraumatic.  Right Ear: External ear normal.  Left Ear: External ear normal.  Nose: Nose normal.   Eyes: Right eye exhibits no discharge. Left eye exhibits no discharge.  Cardiovascular: Normal rate and intact distal pulses.   Pulses:      Dorsalis pedis pulses are 2+ on the right side, and 2+ on the left side.  Pulmonary/Chest: Effort normal.  Abdominal: She exhibits no distension.  Musculoskeletal:       Left knee: She exhibits normal range of motion. Tenderness found. Lateral joint line tenderness noted.       Left lower leg: She exhibits no tenderness.       Left foot: There is no tenderness.  Normal strength and sensation in the lower left extremity. Mild non-pitting swelling to left foot. No tenderness  Neurological: She is alert and oriented to person, place, and time.  Skin: Skin is warm and dry. No erythema.  Nursing note and vitals reviewed.   ED Course  Procedures   DIAGNOSTIC STUDIES: Oxygen Saturation is 100% on RA, normal by my interpretation.    COORDINATION OF CARE: 10:20 PM Ordered left knee x-ray. Discussed results of left knee imaging. Will order ibuprofen and [place] for tomorrow morning. Discussed treatment plan with pt at bedside and pt agreed to plan.  Labs Review Labs Reviewed - No data to display  Imaging Review Dg Knee Complete 4 Views Left  06/08/2015  CLINICAL DATA:  Acute left knee pain without known injury. EXAM: LEFT KNEE - COMPLETE 4+ VIEW COMPARISON:  None. FINDINGS: There is no evidence of fracture, dislocation, or joint effusion. There is no evidence of arthropathy or other focal bone abnormality. Soft tissues are unremarkable. IMPRESSION: Normal left knee. Electronically Signed   By: [name], M.D.   On: 06/08/2015 21:56   I have personally reviewed and evaluated these images and lab results as part of my medical decision-making.   MDM   Final diagnoses:  Left knee pain    Patient's exam is very limited due to severe obesity. It is very difficult to find her knee joint. I do not clearly see swelling in her left knee. Has equal DP  pulses. Her left foot does appear mildly swollen compared to the right, thus will bring her back tomorrow for an ultrasound to rule out DVT. No chest pain or dyspnea. Advised use NSAIDs for her pain. Likely her pain is coming due to morbid obesity. No joint effusion, redness, or warmth to suggest infected joint.   I personally performed the services described in this documentation, which was scribed in my presence. The recorded information has been reviewed and is accurate.    [name], MD 06/08/15 [phone]

## 2015-06-09 ENCOUNTER — Ambulatory Visit (HOSPITAL_BASED_OUTPATIENT_CLINIC_OR_DEPARTMENT_OTHER)
Admission: RE | Admit: 2015-06-09 | Discharge: 2015-06-09 | Disposition: A | Discharge status: Home / Self Care | Payer: 59 | Source: Ambulatory Visit | Attending: Emergency Medicine | Admitting: Emergency Medicine

## 2015-06-09 DIAGNOSIS — M7989 Other specified soft tissue disorders: Secondary | ICD-10-CM | POA: Insufficient documentation

## 2015-06-11 ENCOUNTER — Encounter: Payer: Self-pay | Admitting: Family Medicine

## 2015-06-11 ENCOUNTER — Ambulatory Visit (INDEPENDENT_AMBULATORY_CARE_PROVIDER_SITE_OTHER): Payer: 59 | Admitting: Family Medicine

## 2015-06-11 ENCOUNTER — Ambulatory Visit (HOSPITAL_COMMUNITY)
Admission: RE | Admit: 2015-06-11 | Discharge: 2015-06-11 | Disposition: A | Discharge status: Home / Self Care | Payer: 59 | Source: Ambulatory Visit | Attending: Family Medicine | Admitting: Family Medicine

## 2015-06-11 VITALS — BP 125/82 | HR 79 | Temp 97.9°F | Ht 66.0 in

## 2015-06-11 DIAGNOSIS — M25562 Pain in left knee: Secondary | ICD-10-CM

## 2015-06-11 MED ORDER — TRAMADOL HCL 50 MG PO TABS: 50.0000 mg | ORAL_TABLET | Freq: Four times a day (QID) | ORAL | Status: DC | PRN

## 2015-06-11 NOTE — Progress Notes (Signed)
Subjective: Kathleen Andrews is a 30 y.o. female presenting for left knee pain.  She was seen last week for acute left knee/leg pain and swelling. Had XR's performed which are negative, though were not weight-bearing. Also had U/S to rule out DVT. She was advised to take NSAIDs prn. Her pain has gotten worse since that time, described as 9 out of 10, intermittent, never completely gone, "pulling" on the front of her knee but at times involves the back of her knee. She's had waxing/waning knee and left leg swelling, now improved. She denies any injury or trauma. No giving out, locking, popping. No hip or ankle pain. Tylenol, ibuprofen, and naproxen have failed to improve symptoms. She finds she has more pain when elevating the leg.   Objective: BP 125/82 mmHg  Pulse 79  Temp(Src) 97.9 F (36.6 C) (Oral)  Ht 5\' 6"  (1.676 m)  Wt   LMP 05/11/2015 (Approximate)  BMI 67 Gen: Morbidly obese, pleasant 30 y.o. female in no distress. Knee: Knee joint obscured by adiposity, though symmetrical with right side on inspection. No erythema or warmth noted. Tenderness to palpation over patellar tendon. Full ROM, though with pain at full extension. No ligamentous laxity, medial or lateral joint line tenderness. Antalgic gait.   Assessment/Plan: Kathleen Andrews is a 30 y.o. female here for knee pain.

## 2015-06-11 NOTE — Assessment & Plan Note (Addendum)
Difficult exam and no definitive mechanism in history. Obesity is certainly contributing, though pt continues to insist that something is wrong with her knee.  - Re-ordered left knee XR with weight-bearing AP, lateral and sunrise views - Suspect ultrasound may be of utility, so will refer to [facility]. - Tramadol prn pain, continue with ice and elevation as tolerated.  - Crutches

## 2015-06-11 NOTE — Patient Instructions (Signed)
Thank you for coming in today!  - We will obtain weight-bearing xrays of your left knee to evaluate for joint space narrowing - I have referred you to the sports medicine clinic. They will contact you to schedule an appointment.  - You can take tramadol as needed for the pain.   Our clinic's number is [phone]. Feel free to call any time with questions or concerns. We will answer any questions after hours with our 24-hour emergency line at that number as well.   - Dr. Bonner Puna

## 2015-06-13 ENCOUNTER — Other Ambulatory Visit: Payer: Self-pay | Admitting: Orthopedic Surgery

## 2015-06-13 DIAGNOSIS — M25562 Pain in left knee: Secondary | ICD-10-CM

## 2015-06-16 ENCOUNTER — Ambulatory Visit
Admission: RE | Admit: 2015-06-16 | Discharge: 2015-06-16 | Disposition: A | Discharge status: Home / Self Care | Payer: 59 | Source: Ambulatory Visit | Attending: Orthopedic Surgery | Admitting: Orthopedic Surgery

## 2015-06-16 DIAGNOSIS — M25562 Pain in left knee: Secondary | ICD-10-CM

## 2017-02-07 ENCOUNTER — Ambulatory Visit: Payer: 59 | Admitting: Podiatry

## 2017-02-07 ENCOUNTER — Ambulatory Visit: Payer: 59

## 2017-02-07 ENCOUNTER — Encounter: Payer: Self-pay | Admitting: Podiatry

## 2017-02-07 DIAGNOSIS — M722 Plantar fascial fibromatosis: Secondary | ICD-10-CM | POA: Diagnosis not present

## 2017-02-11 ENCOUNTER — Ambulatory Visit (INDEPENDENT_AMBULATORY_CARE_PROVIDER_SITE_OTHER): Payer: 59 | Admitting: Podiatry

## 2017-02-11 DIAGNOSIS — M722 Plantar fascial fibromatosis: Secondary | ICD-10-CM

## 2017-02-12 NOTE — Progress Notes (Signed)
   Subjective: 32 year old female presents today as a new patient for pain and tenderness in the right plantar heel that has been present for over 6 months.  She states she has seen her PCP and had an x-ray and was diagnosed with plantar fasciitis.  She states she was being treated with naproxen but it caused internal bleeding so she had to discontinue use.  Patient presents today for further treatment and evaluation.   Past Medical History:  Diagnosis Date  .  Supervision of high-risk pregnancy in first trimester   . Amenorrhea due to oral contraceptive   . Anemia    history after pregnancy  . Anxiety   . Axillary adenopathy   . Cancer ([place])    ankle bone area removed  . Chronic kidney disease    history of kidney stones  . Complication of anesthesia    low blood pressure with general anesthesia  . Congenital heart disease, fetal, affecting care of mother, antepartum-in previous child   . Depression   . Dysrhythmia    pregnancy related irregular and rapid heart rate, has followed up with cardiologist  . GERD (gastroesophageal reflux disease)    with pregnancy  . Headache(784.0)    migraines  . History of IUGR (intrauterine growth retardation) and stillbirth, currently pregnant   . Obesity complicating peripregnancy, antepartum   . OBESITY, NOS   . Pregnancy complicated by previous recurrent miscarriages   . Previous cesarean delivery, antepartum condition or complication   . Recurrent UTI   . Shortness of breath   . Tachycardia   . Urge urinary incontinence   . Urinary tract bacterial infections      Objective: Physical Exam General: The patient is alert and oriented x3 in no acute distress.  Dermatology: Skin is warm, dry and supple bilateral lower extremities. Negative for open lesions or macerations bilateral.   Vascular: Dorsalis Pedis and Posterior Tibial pulses palpable bilateral.  Capillary fill time is immediate to all digits.  Neurological: Epicritic and  protective threshold intact bilateral.   Musculoskeletal: Tenderness to palpation at the medial calcaneal tubercale and through the insertion of the plantar fascia of the right foot. All other joints range of motion within normal limits bilateral. Strength 5/5 in all groups bilateral.   Assessment: 1. Plantar fasciitis right 2. Pain in right foot  Plan of Care:  1. Patient evaluated. 2.  Discussed different treatment options.  Patient wants to pursue EPAT. 3.  Patient cannot take oral NSAIDs due to GI bleed. 4.  Patient refused injections. 5.  Appointment with Janett Billow for EPAT. 6.  Return to clinic in 3 months.  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   Edrick Kins, DPM [facility]  Dr. Edrick Kins, DPM    [address], [place] [address]                Office [phone]  Fax [phone]

## 2017-02-18 ENCOUNTER — Ambulatory Visit (INDEPENDENT_AMBULATORY_CARE_PROVIDER_SITE_OTHER): Payer: 59 | Admitting: Podiatry

## 2017-02-18 DIAGNOSIS — M722 Plantar fascial fibromatosis: Secondary | ICD-10-CM

## 2017-02-21 NOTE — Progress Notes (Signed)
Patient presents with ongoing chronic pain in her right heel. She has tried various different treatments but is not getting any relief   Pain on palpation to Rt heel 5 of 10 when pressed  ESWT therapy administered to heel at 5 joules and tolerated well. Advised to avoid NSAIDs and ice and wear supportive shoes. She is to follow up in 1 week for 2nd treatment

## 2017-02-24 NOTE — Progress Notes (Signed)
Patient presents with ongoing chronic pain in her right heel. She has tried various different treatments but is not getting any relief. She said she really has not noticed any changes in her pain, it is still present   Pain on palpation to Rt heel 5 of 10 when pressed  ESWT therapy administered to heel at 5 joules and tolerated well. Advised to avoid NSAIDs and ice and wear supportive shoes. She is to follow up in 1 week for 3rd treatment

## 2017-02-25 ENCOUNTER — Ambulatory Visit (INDEPENDENT_AMBULATORY_CARE_PROVIDER_SITE_OTHER): Payer: 59 | Admitting: Podiatry

## 2017-02-25 DIAGNOSIS — M79676 Pain in unspecified toe(s): Secondary | ICD-10-CM

## 2017-02-25 DIAGNOSIS — M722 Plantar fascial fibromatosis: Secondary | ICD-10-CM

## 2017-02-28 NOTE — Progress Notes (Signed)
Patient states the pain in her Rt heel is improving quite a bit. She said she has more good days than bad days and is able to walk more comfortably.   Mild pain on palpation to heel, most of the pain was experienced on the lateral side of the heel when pressed  ESWT therapy administered to heel at 15 joules and tolerated well. Advised to avoid NSAIDs and ice and wear supportive shoes. She is to follow up in 4 weeks for 4th treatment

## 2017-03-25 ENCOUNTER — Ambulatory Visit (INDEPENDENT_AMBULATORY_CARE_PROVIDER_SITE_OTHER): Payer: 59 | Admitting: Podiatry

## 2017-03-25 DIAGNOSIS — M79676 Pain in unspecified toe(s): Secondary | ICD-10-CM

## 2017-03-25 DIAGNOSIS — M722 Plantar fascial fibromatosis: Secondary | ICD-10-CM

## 2017-03-25 NOTE — Progress Notes (Signed)
Patient states the pain in her Rt heel is improving quite a bit. She said she has more good days than bad days and is able to walk more comfortably.   Mild pain on palpation to heel, most of the pain was experienced on the lateral side of the heel when pressed  ESWT therapy administered to heel at 13 joules and tolerated well. Advised to avoid NSAIDs and ice and wear supportive shoes. She is to follow up in 6 weeks for re-evaluation with Dr Amalia Hailey

## 2017-05-09 ENCOUNTER — Encounter: Payer: Self-pay | Admitting: Podiatry

## 2017-05-09 ENCOUNTER — Ambulatory Visit: Payer: 59 | Admitting: Podiatry

## 2017-05-09 DIAGNOSIS — M722 Plantar fascial fibromatosis: Secondary | ICD-10-CM | POA: Diagnosis not present

## 2017-05-12 NOTE — Progress Notes (Signed)
   Subjective: 32 year old female presenting today for follow up evaluation of plantar fasciitis of the right foot. She has undergone four EPAT treatments and reports the foot pain has improved. She does states she still experiences some pain but it is not as bad as it was. She denies any new complaints at this time. Patient presents today for further treatment and evaluation.   Past Medical History:  Diagnosis Date  .  Supervision of high-risk pregnancy in first trimester   . Amenorrhea due to oral contraceptive   . Anemia    history after pregnancy  . Anxiety   . Axillary adenopathy   . 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 Chronic kidney disease    history of kidney stones  . Complication of anesthesia    low blood pressure with general anesthesia  . Congenital heart disease, fetal, affecting care of mother, antepartum-in previous child   . Depression   . Dysrhythmia    pregnancy related irregular and rapid heart rate, has followed up with cardiologist  . GERD (gastroesophageal reflux disease)    with pregnancy  . Headache(784.0)    migraines  . History of IUGR (intrauterine growth retardation) and stillbirth, currently pregnant   . Obesity complicating peripregnancy, antepartum   . OBESITY, NOS   . Pregnancy complicated by previous recurrent miscarriages   . Previous cesarean delivery, antepartum condition or complication   . Recurrent UTI   . Shortness of breath   . Tachycardia   . Urge urinary incontinence   . Urinary tract bacterial infections      Objective: Physical Exam General: The patient is alert and oriented x3 in no acute distress.  Dermatology: Skin is warm, dry and supple bilateral lower extremities. Negative for open lesions or macerations bilateral.   Vascular: Dorsalis Pedis and Posterior Tibial pulses palpable bilateral.  Capillary fill time is immediate to all digits.  Neurological: Epicritic and protective threshold intact bilateral.    Musculoskeletal: Tenderness to palpation to the plantar aspect of the right heel along the plantar fascia. All other joints range of motion within normal limits bilateral. Strength 5/5 in all groups bilateral.   Assessment: 1. Plantar fasciitis right - improved    Plan of Care:  1. Patient evaluated.  2. Patient recently finished EPAT.  3. Recommended good shoe gear and OTC insoles.  4. Plantar fascial brace dispensed.  5. Patient may consider physical therapy in the future.  6. Return to clinic as needed.   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    Edrick Kins, DPM [facility]  Dr. Edrick Kins, DPM    [address], [place] [address]                Office [phone]  Fax [phone]

## 2017-09-20 ENCOUNTER — Other Ambulatory Visit: Payer: Self-pay

## 2017-09-20 ENCOUNTER — Inpatient Hospital Stay (HOSPITAL_BASED_OUTPATIENT_CLINIC_OR_DEPARTMENT_OTHER)
Admission: EM | Admit: 2017-09-20 | Discharge: 2017-09-21 | Payer: 59 | Attending: Family Medicine | Admitting: Family Medicine

## 2017-09-20 ENCOUNTER — Encounter (HOSPITAL_BASED_OUTPATIENT_CLINIC_OR_DEPARTMENT_OTHER): Payer: Self-pay | Admitting: *Deleted

## 2017-09-20 DIAGNOSIS — N3001 Acute cystitis with hematuria: Secondary | ICD-10-CM | POA: Diagnosis not present

## 2017-09-20 DIAGNOSIS — Z79899 Other long term (current) drug therapy: Secondary | ICD-10-CM | POA: Diagnosis not present

## 2017-09-20 DIAGNOSIS — A5900 Urogenital trichomoniasis, unspecified: Secondary | ICD-10-CM | POA: Insufficient documentation

## 2017-09-20 DIAGNOSIS — R1030 Lower abdominal pain, unspecified: Secondary | ICD-10-CM | POA: Insufficient documentation

## 2017-09-20 DIAGNOSIS — A599 Trichomoniasis, unspecified: Secondary | ICD-10-CM

## 2017-09-20 DIAGNOSIS — N938 Other specified abnormal uterine and vaginal bleeding: Secondary | ICD-10-CM | POA: Diagnosis not present

## 2017-09-20 DIAGNOSIS — Z91040 Latex allergy status: Secondary | ICD-10-CM | POA: Insufficient documentation

## 2017-09-20 NOTE — ED Triage Notes (Signed)
Abdominal cramping. Menses today.

## 2017-09-21 ENCOUNTER — Inpatient Hospital Stay (HOSPITAL_COMMUNITY): Payer: 59

## 2017-09-21 ENCOUNTER — Encounter (HOSPITAL_COMMUNITY): Payer: Self-pay | Admitting: *Deleted

## 2017-09-21 DIAGNOSIS — R1030 Lower abdominal pain, unspecified: Secondary | ICD-10-CM | POA: Diagnosis present

## 2017-09-21 DIAGNOSIS — Z91040 Latex allergy status: Secondary | ICD-10-CM | POA: Diagnosis not present

## 2017-09-21 DIAGNOSIS — N3001 Acute cystitis with hematuria: Secondary | ICD-10-CM

## 2017-09-21 DIAGNOSIS — A599 Trichomoniasis, unspecified: Secondary | ICD-10-CM

## 2017-09-21 DIAGNOSIS — Z79899 Other long term (current) drug therapy: Secondary | ICD-10-CM | POA: Diagnosis not present

## 2017-09-21 DIAGNOSIS — A5900 Urogenital trichomoniasis, unspecified: Secondary | ICD-10-CM | POA: Diagnosis not present

## 2017-09-21 DIAGNOSIS — N938 Other specified abnormal uterine and vaginal bleeding: Secondary | ICD-10-CM | POA: Diagnosis not present

## 2017-09-21 LAB — CBC WITH DIFFERENTIAL/PLATELET
Basophils Absolute: 0 10*3/uL (ref 0.0–0.1)
Basophils Relative: 0 %
Eosinophils Absolute: 0.2 10*3/uL (ref 0.0–0.7)
Eosinophils Relative: 2 %
HEMATOCRIT: 41.5 % (ref 36.0–46.0)
Hemoglobin: 13.3 g/dL (ref 12.0–15.0)
LYMPHS PCT: 25 %
Lymphs Abs: 2.4 10*3/uL (ref 0.7–4.0)
MCH: 26.8 pg (ref 26.0–34.0)
MCHC: 32 g/dL (ref 30.0–36.0)
MCV: 83.7 fL (ref 78.0–100.0)
MONO ABS: 0.5 10*3/uL (ref 0.1–1.0)
MONOS PCT: 5 %
Neutro Abs: 6.7 10*3/uL (ref 1.7–7.7)
Neutrophils Relative %: 68 %
Platelets: 262 10*3/uL (ref 150–400)
RBC: 4.96 MIL/uL (ref 3.87–5.11)
RDW: 15.2 % (ref 11.5–15.5)
WBC: 9.9 10*3/uL (ref 4.0–10.5)

## 2017-09-21 LAB — URINALYSIS, MICROSCOPIC (REFLEX)

## 2017-09-21 LAB — URINALYSIS, ROUTINE W REFLEX MICROSCOPIC
Bilirubin Urine: NEGATIVE
Glucose, UA: NEGATIVE mg/dL
Ketones, ur: NEGATIVE mg/dL
NITRITE: NEGATIVE
PROTEIN: 30 mg/dL — AB
Specific Gravity, Urine: 1.025 (ref 1.005–1.030)
pH: 5.5 (ref 5.0–8.0)

## 2017-09-21 LAB — COMPREHENSIVE METABOLIC PANEL
ALT: 42 U/L (ref 0–44)
AST: 24 U/L (ref 15–41)
Albumin: 3.5 g/dL (ref 3.5–5.0)
Alkaline Phosphatase: 77 U/L (ref 38–126)
Anion gap: 8 (ref 5–15)
BILIRUBIN TOTAL: 0.3 mg/dL (ref 0.3–1.2)
BUN: 17 mg/dL (ref 6–20)
CALCIUM: 8.6 mg/dL — AB (ref 8.9–10.3)
CO2: 25 mmol/L (ref 22–32)
CREATININE: 0.89 mg/dL (ref 0.44–1.00)
Chloride: 103 mmol/L (ref 98–111)
GFR calc Af Amer: >60 mL/min (ref >60)
Glucose, Bld: 103 mg/dL — ABNORMAL HIGH (ref 70–99)
POTASSIUM: 3.8 mmol/L (ref 3.5–5.1)
Sodium: 136 mmol/L (ref 135–145)
TOTAL PROTEIN: 7.7 g/dL (ref 6.5–8.1)

## 2017-09-21 LAB — LIPASE, BLOOD: LIPASE: 23 U/L (ref 11–51)

## 2017-09-21 LAB — HCG, QUANTITATIVE, PREGNANCY: hCG, Beta Chain, Quant, S: <1 m[IU]/mL (ref <5)

## 2017-09-21 LAB — PREGNANCY, URINE: PREG TEST UR: NEGATIVE

## 2017-09-21 MED ORDER — SULFAMETHOXAZOLE-TRIMETHOPRIM 800-160 MG PO TABS: 1.0000 | ORAL_TABLET | Freq: Two times a day (BID) | ORAL | 0 refills | Status: AC

## 2017-09-21 MED ORDER — SODIUM CHLORIDE 0.9 % IV SOLN
INTRAVENOUS | Status: DC | PRN
  Administered 2017-09-21: 250 mL via INTRAVENOUS

## 2017-09-21 MED ORDER — METRONIDAZOLE 500 MG PO TABS
2000.0000 mg | ORAL_TABLET | Freq: Once | ORAL | Status: DC
  Filled 2017-09-21: qty 4

## 2017-09-21 MED ORDER — SODIUM CHLORIDE 0.9 % IV SOLN
INTRAVENOUS | Status: AC
  Filled 2017-09-21: qty 10

## 2017-09-21 MED ORDER — TINIDAZOLE 500 MG PO TABS: 2.0000 g | ORAL_TABLET | Freq: Once | ORAL | 1 refills | Status: AC

## 2017-09-21 MED ORDER — SODIUM CHLORIDE 0.9 % IV SOLN
1.0000 g | Freq: Once | INTRAVENOUS | Status: AC
  Administered 2017-09-21: 1 g via INTRAVENOUS

## 2017-09-21 NOTE — MAU Provider Note (Signed)
Chief Complaint: Abdominal Pain   SUBJECTIVE HPI: Kathleen Andrews is a 32 y.o. [id number] not currently pregnant who presents to maternity admissions via [place] as a transfer from [facility] for lower abdominal pain and r/o ovarian torsion. She reports abdominal pain that started yesterday afternoon. She describes the pain as cramping that is constant. She rates pain 8/10- declined medication that was offered at [facility]. She reports abdominal pain is associated with vaginal bleeding that started yesterday. She reports she has not had her menstrual cycle in several months prior to vaginal bleeding this occurrence. She denies vaginal itching/burning, urinary symptoms, h/a, dizziness, n/v, or fever/chills.  She reports being told at [facility] that she has a UTI and trichomoniasis, she reports not believing she has trich and declining treatment at [facility].     Past Medical History:  Diagnosis Date  .  Supervision of high-risk pregnancy in first trimester   . Amenorrhea due to oral contraceptive   . Anemia    history after pregnancy  . Anxiety   . Axillary adenopathy   . 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 Chronic kidney disease    history of kidney stones  . Complication of anesthesia    low blood pressure with general anesthesia  . Congenital heart disease, fetal, affecting care of mother, antepartum-in previous child   . Depression   . Dysrhythmia    pregnancy related irregular and rapid heart rate, has followed up with cardiologist  . GERD (gastroesophageal reflux disease)    with pregnancy  . Headache(784.0)    migraines  . History of IUGR (intrauterine growth retardation) and stillbirth, currently pregnant   . Obesity complicating peripregnancy, antepartum   . OBESITY, NOS   . Pregnancy complicated by previous recurrent miscarriages   . Previous cesarean delivery, antepartum condition or complication   . Recurrent UTI   . Shortness of breath   . Tachycardia   . Urge urinary  incontinence   . Urinary tract bacterial infections    Past Surgical History:  Procedure Laterality Date  . ANKLE SURGERY Right 2012   cancer  . CESAREAN SECTION     X 2 - failure to progress and repeat elective  . CESAREAN SECTION WITH BILATERAL TUBAL LIGATION N/A 07/03/2013   Procedure: REPEAT CESAREAN SECTION X 3 WITH BILATERAL TUBAL LIGATION;  Surgeon: [name], MD;  Location: [facility];  Service: Obstetrics;  Laterality: N/A;  Dr [name] request CSE anesthesia, also requested to be in room for taping/positioning of patient. 5/25 TWD   Social History   Socioeconomic History  . Marital status: Single    Spouse name: Not on file  . Number of children: Not on file  . Years of education: Not on file  . Highest education level: Not on file  Occupational History  . Not on file  Social Needs  . Financial resource strain: Not on file  . Food insecurity:    Worry: Not on file    Inability: Not on file  . Transportation needs:    Medical: Not on file    Non-medical: Not on file  Tobacco Use  . Smoking status: Never Smoker  . Smokeless tobacco: Never Used  Substance and Sexual Activity  . Alcohol use: No  . Drug use: No  . Sexual activity: Not Currently  Lifestyle  . Physical activity:    Days per week: Not on file    Minutes per session: Not on file  .  Stress: Not on file  Relationships  . Social connections:    Talks on phone: Not on file    Gets together: Not on file    Attends religious service: Not on file    Active member of club or organization: Not on file    Attends meetings of clubs or organizations: Not on file    Relationship status: Not on file  . Intimate partner violence:    Fear of current or ex partner: Not on file    Emotionally abused: Not on file    Physically abused: Not on file    Forced sexual activity: Not on file  Other Topics Concern  . Not on file  Social History Narrative   [employer]         No current  facility-administered medications on file prior to encounter.    Current Outpatient Medications on File Prior to Encounter  Medication Sig Dispense Refill  . Cholecalciferol (VITAMIN D PO) Take by mouth.    . Cyanocobalamin (VITAMIN B-12 IJ) Inject as directed.    . eletriptan (RELPAX) 20 MG tablet TAKE 1 TABLET (20 MG TOTAL) BY MOUTH AS NEEDED FOR MIGRAINE. MAY REPEAT IN 2 HOURS IF NECESSARY  3  . FERROUS SULFATE PO Take by mouth.    . sertraline (ZOLOFT) 100 MG tablet TAKE ALONG WITH A 50MG  TABLET FOR A TOTAL OF 150MG  DAILY  3   Allergies  Allergen Reactions  . Eggs Or Egg-Derived Products Hives and Rash  . Tomato Hives    Hives  . Latex Hives and Rash    "MAKES FLESH RAW"    ROS:  Review of Systems  Constitutional: Negative.   Respiratory: Negative.   Cardiovascular: Negative.   Gastrointestinal: Positive for abdominal pain. Negative for constipation, diarrhea, nausea and vomiting.  Genitourinary: Positive for vaginal bleeding. Negative for difficulty urinating, dysuria, frequency, hematuria and urgency.  Neurological: Negative.    I have reviewed patient's Past Medical Hx, Surgical Hx, Family Hx, Social Hx, medications and allergies.   Physical Exam   Patient Vitals for the past 24 hrs:  BP Temp Temp src Pulse Resp SpO2  09/21/17 0554 (!) 125/40 - - 86 - -  09/21/17 0435 129/86 99 F (37.2 C) Oral 95 - -  09/21/17 0324 111/73 - - 88 16 95 %  09/21/17 0200 (!) 160/89 98 F (36.7 C) - (!) 108 18 98 %   Constitutional: Well-developed, morbid obese female in no acute distress.  Cardiovascular: normal rate Respiratory: normal effort GI: Abd soft, non-tender. Pos BS x 4 MS: Extremities nontender, no edema, normal ROM Neurologic: Alert and oriented x 4.   PELVIC EXAM: Patient refuses pelvic examination    LAB RESULTS Results for orders placed or performed during the hospital encounter of 09/20/17 (from the past 24 hour(s))  Urinalysis, Routine w reflex microscopic      Status: Abnormal   Collection Time: 09/20/17  9:43 PM  Result Value Ref Range   Color, Urine BROWN (A) YELLOW   APPearance CLOUDY (A) CLEAR   Specific Gravity, Urine 1.025 1.005 - 1.030   pH 5.5 5.0 - 8.0   Glucose, UA NEGATIVE NEGATIVE mg/dL   Hgb urine dipstick LARGE (A) NEGATIVE   Bilirubin Urine NEGATIVE NEGATIVE   Ketones, ur NEGATIVE NEGATIVE mg/dL   Protein, ur 30 (A) NEGATIVE mg/dL   Nitrite NEGATIVE NEGATIVE   Leukocytes, UA TRACE (A) NEGATIVE  Pregnancy, urine     Status: None  Collection Time: 09/20/17  9:43 PM  Result Value Ref Range   Preg Test, Ur NEGATIVE NEGATIVE  Urinalysis, Microscopic (reflex)     Status: Abnormal   Collection Time: 09/20/17  9:43 PM  Result Value Ref Range   RBC / HPF >50 0 - 5 RBC/hpf   WBC, UA 11-20 0 - 5 WBC/hpf   Bacteria, UA MANY (A) NONE SEEN   Squamous Epithelial / LPF 11-20 0 - 5   Trichomonas, UA PRESENT (A) NONE SEEN  hCG, quantitative, pregnancy     Status: None   Collection Time: 09/21/17  1:29 AM  Result Value Ref Range   hCG, Beta Chain, Quant, S <1 <5 mIU/mL  CBC with Differential/Platelet     Status: None   Collection Time: 09/21/17  1:29 AM  Result Value Ref Range   WBC 9.9 4.0 - 10.5 K/uL   RBC 4.96 3.87 - 5.11 MIL/uL   Hemoglobin 13.3 12.0 - 15.0 g/dL   HCT 41.5 36.0 - 46.0 %   MCV 83.7 78.0 - 100.0 fL   MCH 26.8 26.0 - 34.0 pg   MCHC 32.0 30.0 - 36.0 g/dL   RDW 15.2 11.5 - 15.5 %   Platelets 262 150 - 400 K/uL   Neutrophils Relative % 68 %   Neutro Abs 6.7 1.7 - 7.7 K/uL   Lymphocytes Relative 25 %   Lymphs Abs 2.4 0.7 - 4.0 K/uL   Monocytes Relative 5 %   Monocytes Absolute 0.5 0.1 - 1.0 K/uL   Eosinophils Relative 2 %   Eosinophils Absolute 0.2 0.0 - 0.7 K/uL   Basophils Relative 0 %   Basophils Absolute 0.0 0.0 - 0.1 K/uL  Comprehensive metabolic panel     Status: Abnormal   Collection Time: 09/21/17  1:29 AM  Result Value Ref Range   Sodium 136 135 - 145 mmol/L   Potassium 3.8 3.5 - 5.1 mmol/L    Chloride 103 98 - 111 mmol/L   CO2 25 22 - 32 mmol/L   Glucose, Bld 103 (H) 70 - 99 mg/dL   BUN 17 6 - 20 mg/dL   Creatinine, Ser 0.89 0.44 - 1.00 mg/dL   Calcium 8.6 (L) 8.9 - 10.3 mg/dL   Total Protein 7.7 6.5 - 8.1 g/dL   Albumin 3.5 3.5 - 5.0 g/dL   AST 24 15 - 41 U/L   ALT 42 0 - 44 U/L   Alkaline Phosphatase 77 38 - 126 U/L   Total Bilirubin 0.3 0.3 - 1.2 mg/dL   GFR calc non Af Amer >60 >60 mL/min   GFR calc Af Amer >60 >60 mL/min   Anion gap 8 5 - 15  Lipase, blood     Status: None   Collection Time: 09/21/17  1:29 AM  Result Value Ref Range   Lipase 23 11 - 51 U/L   IMAGING US Pelvis (transabdominal Only)  Result Date: 09/21/2017 CLINICAL DATA:  Lower abdominal pain tonight.  G10 P4. EXAM: TRANSABDOMINAL ULTRASOUND OF PELVIS DOPPLER ULTRASOUND OF OVARIES TECHNIQUE: Both transabdominal ultrasound examinations of the pelvis were performed. Transabdominal technique was performed for global imaging of the pelvis including uterus, ovaries, adnexal regions, and pelvic cul-de-sac. Patient did not wish to undergo endovaginal imaging. Color and duplex Doppler ultrasound was utilized to evaluate blood flow to the ovaries. COMPARISON:  None. FINDINGS: Habitus limited examination. Uterus Measurements: 7.8 x 4.3 x 5.8 cm. No fibroids or other mass visualized. Endometrium Not well characterized. Right ovary Measurements: 2.3 x  1.5 x 1.7 cm. Normal appearance/no adnexal mass. Left ovary Measurements: 2 x 1.6 x 1.5 cm.  Normal appearance/no adnexal mass. Limited assessment of ovaries due to habitus and location. Normal spectral waveforms LEFT ovary. Poorly characterized RIGHT ovary with color flow. Other findings No abnormal free fluid. IMPRESSION: No definite acute pelvic process on this limited transabdominal habitus limited pelvic ultrasound. Electronically Signed   By: Elon Alas M.D.   On: 09/21/2017 05:34   US Pelvic Doppler (torsion R/o Or Mass Arterial Flow)  Result Date:  09/21/2017 CLINICAL DATA:  Lower abdominal pain tonight.  G10 P4. EXAM: TRANSABDOMINAL ULTRASOUND OF PELVIS DOPPLER ULTRASOUND OF OVARIES TECHNIQUE: Both transabdominal ultrasound examinations of the pelvis were performed. Transabdominal technique was performed for global imaging of the pelvis including uterus, ovaries, adnexal regions, and pelvic cul-de-sac. Patient did not wish to undergo endovaginal imaging. Color and duplex Doppler ultrasound was utilized to evaluate blood flow to the ovaries. COMPARISON:  None. FINDINGS: Habitus limited examination. Uterus Measurements: 7.8 x 4.3 x 5.8 cm. No fibroids or other mass visualized. Endometrium Not well characterized. Right ovary Measurements: 2.3 x 1.5 x 1.7 cm. Normal appearance/no adnexal mass. Left ovary Measurements: 2 x 1.6 x 1.5 cm.  Normal appearance/no adnexal mass. Limited assessment of ovaries due to habitus and location. Normal spectral waveforms LEFT ovary. Poorly characterized RIGHT ovary with color flow. Other findings No abnormal free fluid. IMPRESSION: No definite acute pelvic process on this limited transabdominal habitus limited pelvic ultrasound. Electronically Signed   By: Elon Alas M.D.   On: 09/21/2017 05:34    MAU Management/MDM: Orders Placed This Encounter  Procedures  . US PELVIS (TRANSABDOMINAL ONLY)  . US PELVIC DOPPLER (TORSION R/O OR MASS ARTERIAL FLOW)   Patient refused transvaginal ultrasound examination    Meds ordered this encounter  Medications  . DISCONTD: metroNIDAZOLE (FLAGYL) tablet 2,000 mg  . sulfamethoxazole-trimethoprim (BACTRIM DS,SEPTRA DS) 800-160 MG tablet    Sig: Take 1 tablet by mouth 2 (two) times daily.    Dispense:  6 tablet    Refill:  0    Order Specific Question:   Supervising Provider    Answer:   Truett Mainland [[id number]]  . tinidazole (TINDAMAX) 500 MG tablet    Sig: Take 4 tablets (2,000 mg total) by mouth once for 1 dose. Take with food    Dispense:  4 tablet    Refill:  1     Order Specific Question:   Supervising Provider    Answer:   Truett Mainland [[id number]]   Patient declines treatment for Trich in MAU states, "Does not like Flagyl or that it makes me sick". Educated and discussed other medication that is not Flagyl for treatment of trich. Rx sent to pharmacy for Tindamax.   Patient reports continued pain of 7/10- medication offered. Patient declines medication at this time.  [place] and lab work reviewed with patient. Rx for UTI sent to pharmacy of choice. Follow up as scheduled with PCP. Pt discharged. Pt stable at time of discharge.   ASSESSMENT 1. Lower abdominal pain   2. Acute cystitis with hematuria   3. Trichomoniasis     PLAN Discharge home Rx for Bactrim and Tindamax  Follow up as scheduled with PCP  Discussed reasons to return to MAU   Follow-up Information    Everrett Coombe, MD Follow up.   Specialty:  Family Medicine Why:  Follow up as scheduled with primary for yearly examination  Contact information: [address] [place] [address] [phone]           Allergies as of 09/21/2017      Reactions   Eggs Or Egg-derived Products Hives, Rash   Tomato Hives   Hives   Latex Hives, Rash   "MAKES FLESH RAW"      Medication List    TAKE these medications   eletriptan 20 MG tablet Commonly known as:  RELPAX TAKE 1 TABLET (20 MG TOTAL) BY MOUTH AS NEEDED FOR MIGRAINE. MAY REPEAT IN 2 HOURS IF NECESSARY   FERROUS SULFATE PO Take by mouth.   sertraline 100 MG tablet Commonly known as:  ZOLOFT TAKE ALONG WITH A 50MG  TABLET FOR A TOTAL OF 150MG  DAILY   sulfamethoxazole-trimethoprim 800-160 MG tablet Commonly known as:  BACTRIM DS,SEPTRA DS Take 1 tablet by mouth 2 (two) times daily.   VITAMIN B-12 IJ Inject as directed.   VITAMIN D PO Take by mouth.     ASK your doctor about these medications   tinidazole 500 MG tablet Commonly known as:  TINDAMAX Take 4 tablets (2,000 mg total) by mouth once for 1 dose. Take with  food Ask about: Should I take this medication?      Darrol Poke  Certified Nurse-Midwife 09/21/2017  6:49 AM

## 2017-09-21 NOTE — MAU Note (Signed)
Pt reports to MAU from [place] via carelink. Pt reports at 1700 09/20/17 she started experiencing lower abdominal pain. Pt states her LMP was July 27, 2017. Pt states she has had a small amt of vaginal bleeding that she has only changed her pad once for. Pt denies vaginal discharge or urinary symptoms.

## 2017-09-21 NOTE — ED Notes (Signed)
Care link contacted for transport to [facility].

## 2017-09-21 NOTE — ED Provider Notes (Signed)
[facility]ERGENCY DEPARTMENT Provider Note   CSN: [id number] Arrival date & time: 09/20/17  2127     History   Chief Complaint Chief Complaint  Patient presents with  . Abdominal Pain    HPI Kathleen Andrews is a 32 y.o. female.  The history is provided by the patient.  Abdominal Pain   This is a new problem. The current episode started 6 to 12 hours ago. The problem occurs constantly. The problem has been gradually worsening. The pain is located in the generalized abdominal region. The pain is moderate. Pertinent negatives include fever, diarrhea, nausea and vomiting. The symptoms are aggravated by palpation. Nothing relieves the symptoms.   Presents with abdominal cramping for the past several hours.  She also reports vaginal bleeding.  She reports she has not had her menstrual cycle in several months, and started today.  She reports this abdominal cramping is different than prior. Denies fever/vomiting/diarrhea  Past Medical History:  Diagnosis Date  .  Supervision of high-risk pregnancy in first trimester   . Amenorrhea due to oral contraceptive   . Anemia    history after pregnancy  . Anxiety   . Axillary adenopathy   . 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 Chronic kidney disease    history of kidney stones  . Complication of anesthesia    low blood pressure with general anesthesia  . Congenital heart disease, fetal, affecting care of mother, antepartum-in previous child   . Depression   . Dysrhythmia    pregnancy related irregular and rapid heart rate, has followed up with cardiologist  . GERD (gastroesophageal reflux disease)    with pregnancy  . Headache(784.0)    migraines  . History of IUGR (intrauterine growth retardation) and stillbirth, currently pregnant   . Obesity complicating peripregnancy, antepartum   . OBESITY, NOS   . Pregnancy complicated by previous recurrent miscarriages   . Previous cesarean delivery, antepartum condition or  complication   . Recurrent UTI   . Shortness of breath   . Tachycardia   . Urge urinary incontinence   . Urinary tract bacterial infections     Patient Active Problem List   Diagnosis Date Noted  . Left knee pain 06/11/2015  . Menorrhagia with regular cycle 12/24/2013  . 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 Urge urinary incontinence 06/19/2010  . BMI 60.0-69.9, adult ([place]) 04/07/2006  . Migraine without aura 04/07/2006    Past Surgical History:  Procedure Laterality Date  . ANKLE SURGERY Right 2012   cancer  . CESAREAN SECTION     X 2 - failure to progress and repeat elective  . CESAREAN SECTION WITH BILATERAL TUBAL LIGATION N/A 07/03/2013   Procedure: REPEAT CESAREAN SECTION X 3 WITH BILATERAL TUBAL LIGATION;  Surgeon: [name], MD;  Location: [facility];  Service: Obstetrics;  Laterality: N/A;  Dr [name] request CSE anesthesia, also requested to be in room for taping/positioning of patient. 5/25 TWD     OB History    Gravida  10   Para  4   Term  4   Preterm  0   AB  6   Living  3     SAB  5   TAB  1   Ectopic  0   Multiple  0   Live Births  3            Home Medications    Prior to Admission medications   Medication Sig  Start Date End Date Taking? Authorizing Provider  eletriptan (RELPAX) 20 MG tablet TAKE 1 TABLET (20 MG TOTAL) BY MOUTH AS NEEDED FOR MIGRAINE. MAY REPEAT IN 2 HOURS IF NECESSARY 01/06/17  Yes [provider]  sertraline (ZOLOFT) 100 MG tablet TAKE ALONG WITH A 50MG  TABLET FOR A TOTAL OF 150MG  DAILY 01/06/17  Yes [provider]    Family History Family History  Problem Relation Age of Onset  . Cancer Other   . Hypertension Other   . Hyperlipidemia Other   . Blindness Other   . Deafness Other   . Diabetes Father   . Heart disease Other        mother adopted but reported strong history    Social History Social History   Tobacco Use  . Smoking status: Never Smoker  . Smokeless tobacco: Never Used    Substance Use Topics  . Alcohol use: No  . Drug use: No     Allergies   Eggs or egg-derived products; Tomato; and Latex   Review of Systems Review of Systems  Constitutional: Negative for fever.  Gastrointestinal: Positive for abdominal pain. Negative for diarrhea, nausea and vomiting.  Genitourinary: Positive for vaginal bleeding.  All other systems reviewed and are negative.    Physical Exam Updated Vital Signs BP (!) 160/105   Pulse (!) 111   Temp 98 F (36.7 C) (Oral)   Resp 18   Ht 1.676 m (5\' 6" )   Wt (!) 193.5 kg   LMP 09/20/2017   SpO2 98%   BMI 68.85 kg/m   Physical Exam CONSTITUTIONAL: Well developed/well nourished sitting in the chair and fully clothed  HEAD: Normocephalic/atraumatic EYES: EOMI ENMT: Mucous membranes moist NECK: supple no meningeal signs SPINE/BACK:entire spine nontender CV: S1/S2 noted, no murmurs/rubs/gallops noted LUNGS: Lungs are clear to auscultation bilaterally, no apparent distress ABDOMEN: soft, mild lower abdominal tenderness limited by body habitus, no rebound or guarding, bowel sounds noted throughout abdomen GU:no cva tenderness, patient declines pelvic NEURO: Pt is awake/alert/appropriate, moves all extremitiesx4.  No facial droop.   EXTREMITIES: pulses normal/equal, full ROM SKIN: warm, color normal PSYCH: no abnormalities of mood noted, alert and oriented to situation   ED Treatments / Results  Labs (all labs ordered are listed, but only abnormal results are displayed) Labs Reviewed  URINALYSIS, ROUTINE W REFLEX MICROSCOPIC - Abnormal; Notable for the following components:      Result Value   Color, Urine BROWN (*)    APPearance CLOUDY (*)    Hgb urine dipstick LARGE (*)    Protein, ur 30 (*)    Leukocytes, UA TRACE (*)    All other components within normal limits  URINALYSIS, MICROSCOPIC (REFLEX) - Abnormal; Notable for the following components:   Bacteria, UA MANY (*)    Trichomonas, UA PRESENT (*)    All  other components within normal limits  COMPREHENSIVE METABOLIC PANEL - Abnormal; Notable for the following components:   Glucose, Bld 103 (*)    Calcium 8.6 (*)    All other components within normal limits  PREGNANCY, URINE  HCG, QUANTITATIVE, PREGNANCY  CBC WITH DIFFERENTIAL/PLATELET  LIPASE, BLOOD    EKG None  Radiology No results found.  Procedures Procedures (including critical care time)  Medications Ordered in ED Medications  0.9 %  sodium chloride infusion (250 mLs Intravenous New Bag/Given 09/21/17 0125)  sodium chloride 0.9 % with cefTRIAXone (ROCEPHIN) ADS Med (  Canceled Entry 09/21/17 0115)  cefTRIAXone (ROCEPHIN) 1 g in sodium chloride  0.9 % 100 mL IVPB (1 g Intravenous New Bag/Given 09/21/17 0127)     Initial Impression / Assessment and Plan / ED Course  I have reviewed the triage vital signs and the nursing notes.  Pertinent labs  results that were available during my care of the patient were reviewed by me and considered in my medical decision making (see chart for details).     12:45 AM Patient refuses pain medications 1:47 AM Patient informed of urinalysis results, including trichomonas.  She reports she has had this previously, has been treated, and reports she is always been told that it must be examined under microscope to actually diagnosed trichomonas.  She does not feel that she has it.  This was discussed in private without any family members  Patient reports continued lower abdominal pain, bilaterally, right greater than left.  Strong suspicion for ovarian pathology, and her exam is extremely limited due to body habitus as well as refusal of pelvic exam.  I feel she should undergo an emergent pelvic ultrasound.  Patient will require transfer to [facility] Also treat for UTI 2:19 AM Labs reassuring.  Will transfer to [facility].  Accepted by Dr. Nehemiah Settle.  I feel this is most likely pelvic pathology, therefore will need  emergent pelvic ultrasound.  She will also need treatment for UTI.  His ultrasound imaging is negative and if no worsening, she can be discharged with treatment for UTI.  My suspicion for appendicitis or other acute abdominal emergency is low Final Clinical Impressions(s) / ED Diagnoses   Final diagnoses:  Lower abdominal pain  Acute cystitis with hematuria    ED Discharge Orders    None       Ripley Fraise, MD 09/21/17 [phone]

## 2017-11-08 ENCOUNTER — Telehealth: Payer: Self-pay | Admitting: Student in an Organized Health Care Education/Training Program

## 2017-11-08 NOTE — Telephone Encounter (Signed)
Spoke with pt. Declined Health Maintenance appt. Has established primary care at [facility] for a while. [place]

## 2020-12-08 ENCOUNTER — Emergency Department (HOSPITAL_BASED_OUTPATIENT_CLINIC_OR_DEPARTMENT_OTHER): Payer: 59

## 2020-12-08 ENCOUNTER — Other Ambulatory Visit: Payer: Self-pay

## 2020-12-08 ENCOUNTER — Emergency Department (HOSPITAL_BASED_OUTPATIENT_CLINIC_OR_DEPARTMENT_OTHER)
Admission: EM | Admit: 2020-12-08 | Discharge: 2020-12-08 | Disposition: A | Discharge status: Home / Self Care | Payer: 59 | Attending: Emergency Medicine | Admitting: Emergency Medicine

## 2020-12-08 ENCOUNTER — Encounter (HOSPITAL_BASED_OUTPATIENT_CLINIC_OR_DEPARTMENT_OTHER): Payer: Self-pay | Admitting: Radiology

## 2020-12-08 DIAGNOSIS — N9489 Other specified conditions associated with female genital organs and menstrual cycle: Secondary | ICD-10-CM | POA: Diagnosis not present

## 2020-12-08 DIAGNOSIS — R1013 Epigastric pain: Secondary | ICD-10-CM | POA: Insufficient documentation

## 2020-12-08 DIAGNOSIS — K219 Gastro-esophageal reflux disease without esophagitis: Secondary | ICD-10-CM | POA: Insufficient documentation

## 2020-12-08 DIAGNOSIS — Z91040 Latex allergy status: Secondary | ICD-10-CM | POA: Diagnosis not present

## 2020-12-08 DIAGNOSIS — R1011 Right upper quadrant pain: Secondary | ICD-10-CM | POA: Insufficient documentation

## 2020-12-08 DIAGNOSIS — R112 Nausea with vomiting, unspecified: Secondary | ICD-10-CM | POA: Diagnosis not present

## 2020-12-08 DIAGNOSIS — N189 Chronic kidney disease, unspecified: Secondary | ICD-10-CM | POA: Insufficient documentation

## 2020-12-08 DIAGNOSIS — Z85830 Personal history of malignant neoplasm of bone: Secondary | ICD-10-CM | POA: Diagnosis not present

## 2020-12-08 DIAGNOSIS — N10 Acute tubulo-interstitial nephritis: Secondary | ICD-10-CM

## 2020-12-08 LAB — CBC WITH DIFFERENTIAL/PLATELET
Abs Immature Granulocytes: 0.03 10*3/uL (ref 0.00–0.07)
Basophils Absolute: 0 10*3/uL (ref 0.0–0.1)
Basophils Relative: 0 %
Eosinophils Absolute: 0.1 10*3/uL (ref 0.0–0.5)
Eosinophils Relative: 1 %
HCT: 36.1 % (ref 36.0–46.0)
Hemoglobin: 10.8 g/dL — ABNORMAL LOW (ref 12.0–15.0)
Immature Granulocytes: 0 %
Lymphocytes Relative: 26 %
Lymphs Abs: 2.7 10*3/uL (ref 0.7–4.0)
MCH: 21.5 pg — ABNORMAL LOW (ref 26.0–34.0)
MCHC: 29.9 g/dL — ABNORMAL LOW (ref 30.0–36.0)
MCV: 71.8 fL — ABNORMAL LOW (ref 80.0–100.0)
Monocytes Absolute: 0.5 10*3/uL (ref 0.1–1.0)
Monocytes Relative: 5 %
Neutro Abs: 7 10*3/uL (ref 1.7–7.7)
Neutrophils Relative %: 68 %
Platelets: 164 10*3/uL (ref 150–400)
RBC: 5.03 MIL/uL (ref 3.87–5.11)
RDW: 18.2 % — ABNORMAL HIGH (ref 11.5–15.5)
WBC: 10.4 10*3/uL (ref 4.0–10.5)
nRBC: 0 % (ref 0.0–0.2)

## 2020-12-08 LAB — URINALYSIS, ROUTINE W REFLEX MICROSCOPIC
Bilirubin Urine: NEGATIVE
Glucose, UA: NEGATIVE mg/dL
Hgb urine dipstick: NEGATIVE
Ketones, ur: NEGATIVE mg/dL
Leukocytes,Ua: NEGATIVE
Nitrite: POSITIVE — AB
Protein, ur: NEGATIVE mg/dL
Specific Gravity, Urine: 1.02 (ref 1.005–1.030)
pH: 6 (ref 5.0–8.0)

## 2020-12-08 LAB — URINALYSIS, MICROSCOPIC (REFLEX)

## 2020-12-08 LAB — COMPREHENSIVE METABOLIC PANEL
ALT: 89 U/L — ABNORMAL HIGH (ref 0–44)
AST: 56 U/L — ABNORMAL HIGH (ref 15–41)
Albumin: 3.1 g/dL — ABNORMAL LOW (ref 3.5–5.0)
Alkaline Phosphatase: 84 U/L (ref 38–126)
Anion gap: 12 (ref 5–15)
BUN: 9 mg/dL (ref 6–20)
CO2: 22 mmol/L (ref 22–32)
Calcium: 8.9 mg/dL (ref 8.9–10.3)
Chloride: 101 mmol/L (ref 98–111)
Creatinine, Ser: 0.86 mg/dL (ref 0.44–1.00)
GFR, Estimated: >60 mL/min (ref >60)
Glucose, Bld: 154 mg/dL — ABNORMAL HIGH (ref 70–99)
Potassium: 3.8 mmol/L (ref 3.5–5.1)
Sodium: 135 mmol/L (ref 135–145)
Total Bilirubin: 0.4 mg/dL (ref 0.3–1.2)
Total Protein: 7.4 g/dL (ref 6.5–8.1)

## 2020-12-08 LAB — LIPASE, BLOOD: Lipase: 27 U/L (ref 11–51)

## 2020-12-08 LAB — HCG, QUANTITATIVE, PREGNANCY: hCG, Beta Chain, Quant, S: <1 m[IU]/mL (ref <5)

## 2020-12-08 MED ORDER — ALUM & MAG HYDROXIDE-SIMETH 200-200-20 MG/5ML PO SUSP
30.0000 mL | Freq: Once | ORAL | Status: AC
  Administered 2020-12-08: 30 mL via ORAL
  Filled 2020-12-08: qty 30

## 2020-12-08 MED ORDER — CEPHALEXIN 500 MG PO CAPS: 1000.0000 mg | ORAL_CAPSULE | Freq: Two times a day (BID) | ORAL | 0 refills | Status: DC

## 2020-12-08 MED ORDER — CEPHALEXIN 500 MG PO CAPS: 1000.0000 mg | ORAL_CAPSULE | Freq: Two times a day (BID) | ORAL | 0 refills | Status: AC

## 2020-12-08 MED ORDER — SODIUM CHLORIDE 0.9 % IV BOLUS
1000.0000 mL | Freq: Once | INTRAVENOUS | Status: AC
  Administered 2020-12-08: 1000 mL via INTRAVENOUS

## 2020-12-08 MED ORDER — ONDANSETRON 4 MG PO TBDP
ORAL_TABLET | ORAL | 0 refills | Status: AC
Start: 2020-12-08 — End: ?

## 2020-12-08 MED ORDER — FENTANYL CITRATE PF 50 MCG/ML IJ SOSY
100.0000 ug | PREFILLED_SYRINGE | Freq: Once | INTRAMUSCULAR | Status: AC
  Administered 2020-12-08: 100 ug via INTRAVENOUS
  Filled 2020-12-08: qty 2

## 2020-12-08 MED ORDER — CEPHALEXIN 250 MG PO CAPS
1000.0000 mg | ORAL_CAPSULE | Freq: Once | ORAL | Status: AC
  Administered 2020-12-08: 1000 mg via ORAL
  Filled 2020-12-08: qty 4

## 2020-12-08 MED ORDER — ONDANSETRON HCL 4 MG/2ML IJ SOLN
4.0000 mg | Freq: Once | INTRAMUSCULAR | Status: AC
  Administered 2020-12-08: 4 mg via INTRAVENOUS
  Filled 2020-12-08: qty 2

## 2020-12-08 MED ORDER — ONDANSETRON 4 MG PO TBDP: ORAL_TABLET | ORAL | 0 refills | Status: DC

## 2020-12-08 MED ORDER — IOHEXOL 300 MG/ML  SOLN
100.0000 mL | Freq: Once | INTRAMUSCULAR | Status: AC | PRN
  Administered 2020-12-08: 100 mL via INTRAVENOUS

## 2020-12-08 NOTE — ED Notes (Signed)
Pt made aware of UA, unable to urinate at this time but will let staff know

## 2020-12-08 NOTE — ED Provider Notes (Signed)
[facility] EMERGENCY DEPARTMENT Provider Note   CSN: [id number] Arrival date & time: 12/08/20  4098     History Chief Complaint  Patient presents with   Abdominal Pain   Back Pain    Kathleen Andrews is a 35 y.o. female.  The history is provided by the patient and the spouse.  Abdominal Pain Pain location:  Epigastric Pain quality: sharp   Pain radiates to:  Back Pain severity:  Severe Onset quality:  Sudden Timing:  Constant Progression:  Worsening Chronicity:  New Relieved by:  Nothing Worsened by:  Movement and palpation Associated symptoms: nausea and vomiting   Associated symptoms: no chest pain, no dysuria and no fever   Back Pain Associated symptoms: abdominal pain   Associated symptoms: no chest pain, no dysuria and no fever   Patient presents with sudden onset of severe upper abdominal pain.  The pain radiates to her back.  She reports associated nausea vomiting.  She reports she went to bed feeling well, but this occurred after waking up to go to the restroom.  No change in her bowel movements.  No urinary symptoms are reported    Past Medical History:  Diagnosis Date    Supervision of high-risk pregnancy in first trimester    Amenorrhea due to oral contraceptive    Anemia    history after pregnancy   Anxiety    Axillary adenopathy    Cancer ([facility])    ankle bone area removed   Chronic kidney disease    history of kidney stones   Complication of anesthesia    low blood pressure with general anesthesia   Congenital heart disease, fetal, affecting care of mother, antepartum-in previous child    Depression    Dysrhythmia    pregnancy related irregular and rapid heart rate, has followed up with cardiologist   GERD (gastroesophageal reflux disease)    with pregnancy   Headache(784.0)    migraines   History of IUGR (intrauterine growth retardation) and stillbirth, currently pregnant    Obesity complicating peripregnancy, antepartum    OBESITY,  NOS    Pregnancy complicated by previous recurrent miscarriages    Previous cesarean delivery, antepartum condition or complication    Recurrent UTI    Shortness of breath    Tachycardia    Urge urinary incontinence    Urinary tract bacterial infections     Patient Active Problem List   Diagnosis Date Noted   Left knee pain 06/11/2015   Menorrhagia with regular cycle 12/24/2013   Recurrent UTI 03/18/2011   Urge urinary incontinence 06/19/2010   BMI 60.0-69.9, adult ([place]) 04/07/2006   Migraine without aura 04/07/2006    Past Surgical History:  Procedure Laterality Date   ANKLE SURGERY Right 2012   cancer   CESAREAN SECTION     X 2 - failure to progress and repeat elective   CESAREAN SECTION WITH BILATERAL TUBAL LIGATION N/A 07/03/2013   Procedure: REPEAT CESAREAN SECTION X 3 WITH BILATERAL TUBAL LIGATION;  Surgeon: [name], MD;  Location: [facility];  Service: Obstetrics;  Laterality: N/A;  Dr [name] request CSE anesthesia, also requested to be in room for taping/positioning of patient. 5/25 TWD     OB History     Gravida  10   Para  4   Term  4   Preterm  0   AB  6   Living  3      SAB  5   IAB  1  Ectopic  0   Multiple  0   Live Births  3           Family History  Problem Relation Age of Onset   Cancer Other    Hypertension Other    Hyperlipidemia Other    Blindness Other    Deafness Other    Diabetes Father    Heart disease Other        mother adopted but reported strong history    Social History   Tobacco Use   Smoking status: Never   Smokeless tobacco: Never  Substance Use Topics   Alcohol use: No   Drug use: No    Home Medications Prior to Admission medications   Medication Sig Start Date End Date Taking? Authorizing Provider  Cholecalciferol (VITAMIN D PO) Take by mouth.    [provider]  Cyanocobalamin (VITAMIN B-12 IJ) Inject as directed.    [provider]  eletriptan (RELPAX) 20 MG tablet TAKE 1  TABLET (20 MG TOTAL) BY MOUTH AS NEEDED FOR MIGRAINE. MAY REPEAT IN 2 HOURS IF NECESSARY 01/06/17   [provider]  FERROUS SULFATE PO Take by mouth.    [provider]  sertraline (ZOLOFT) 100 MG tablet TAKE ALONG WITH A 50MG  TABLET FOR A TOTAL OF 150MG  DAILY 01/06/17   [provider]  sulfamethoxazole-trimethoprim (BACTRIM DS,SEPTRA DS) 800-160 MG tablet Take 1 tablet by mouth 2 (two) times daily. 09/21/17   Lajean Manes, CNM    Allergies    Eggs or egg-derived products, Tomato, and Latex  Review of Systems   Review of Systems  Constitutional:  Negative for fever.  Cardiovascular:  Negative for chest pain.  Gastrointestinal:  Positive for abdominal pain, nausea and vomiting.  Genitourinary:  Negative for dysuria.  Musculoskeletal:  Positive for back pain.  All other systems reviewed and are negative.  Physical Exam Updated Vital Signs BP (!) 134/94   Pulse 74   Temp (!) 97.5 F (36.4 C) (Oral)   Resp (!) 21   Ht 1.651 m (5\' 5" )   Wt (!) 187.3 kg   SpO2 100%   BMI 68.73 kg/m   Physical Exam CONSTITUTIONAL: Well developed/well nourished, uncomfortable appearing HEAD: Normocephalic/atraumatic EYES: EOMI/PERRL, no icterus ENMT: Mask in place NECK: supple no meningeal signs SPINE/BACK:entire spine nontender CV: S1/S2 noted, no murmurs/rubs/gallops noted LUNGS: Lungs are clear to auscultation bilaterally, no apparent distress ABDOMEN: soft, moderate epigastric tenderness, no rebound or guarding, bowel sounds noted throughout abdomen GU:no cva tenderness NEURO: Pt is awake/alert/appropriate, moves all extremitiesx4.  No facial droop.   EXTREMITIES: pulses normal/equal, full ROM SKIN: warm, color normal PSYCH: Anxious ED Results / Procedures / Treatments   Labs (all labs ordered are listed, but only abnormal results are displayed) Labs Reviewed  CBC WITH DIFFERENTIAL/PLATELET - Abnormal; Notable for the following components:      Result  Value   Hemoglobin 10.8 (*)    MCV 71.8 (*)    MCH 21.5 (*)    MCHC 29.9 (*)    RDW 18.2 (*)    All other components within normal limits  COMPREHENSIVE METABOLIC PANEL - Abnormal; Notable for the following components:   Glucose, Bld 154 (*)    Albumin 3.1 (*)    AST 56 (*)    ALT 89 (*)    All other components within normal limits  LIPASE, BLOOD  HCG, QUANTITATIVE, PREGNANCY  URINALYSIS, ROUTINE W REFLEX MICROSCOPIC    EKG EKG Interpretation  Date/Time:  Monday December 08 2020 06:01:16 EDT Ventricular Rate:  77 PR Interval:  171 QRS Duration: 91 QT Interval:  401 QTC Calculation: 774 R Axis:   14 Text Interpretation: Sinus rhythm Confirmed by Ripley Fraise [phone]) on 12/08/2020 6:05:52 AM  Radiology No results found.  Procedures Procedures   Medications Ordered in ED Medications  ondansetron (ZOFRAN) injection 4 mg (4 mg Intravenous Given 12/08/20 0553)  fentaNYL (SUBLIMAZE) injection 100 mcg (100 mcg Intravenous Given 12/08/20 0555)  sodium chloride 0.9 % bolus 1,000 mL (1,000 mLs Intravenous New Bag/Given 12/08/20 0704)  ondansetron (ZOFRAN) injection 4 mg (4 mg Intravenous Given 12/08/20 0658)  fentaNYL (SUBLIMAZE) injection 100 mcg (100 mcg Intravenous Given 12/08/20 5320)    ED Course  I have reviewed the triage vital signs and the nursing notes.  Pertinent labs & imaging results that were available during my care of the patient were reviewed by me and considered in my medical decision making (see chart for details).    MDM Rules/Calculators/A&P                           This patient presents to the ED for concern of abdominal pain, this involves an extensive number of treatment options, and is a complaint that carries with it a high risk of complications and morbidity.  The differential diagnosis includes cholecystitis, pancreatitis, bowel perforation, acute coronary syndrome, appendicitis   Lab Tests:  I Ordered, reviewed, and interpreted labs,  which included electrolytes, liver function panel, blood count, lipase, urinalysis  Medicines ordered:  I ordered medication fentanyl for pain    Additional history obtained:  Additional history obtained from husband    Reevaluation:  After the interventions stated above, I reevaluated the patient and found patient continues to have abdominal pain  7:17 AM On reassessment, patient is tearful and still having right upper quad abdominal pain.  Ultrasound imaging has been ordered.  Signed out to Dr. Tyrone Nine at shift change to follow-up on imaging Final Clinical Impression(s) / ED Diagnoses Final diagnoses:  RUQ pain    Rx / DC Orders ED Discharge Orders     None        Ripley Fraise, MD 12/08/20 [phone]

## 2020-12-08 NOTE — ED Provider Notes (Signed)
Received the patient in signout from Dr. Christy Gentles, briefly patient with severe upper abdominal pain.  Plan for RUQ [place] if negative, CT.   US unremarkable.  CT ordered.  CT also without obvious pathology.  Patient's UA has returned and appears to be infected.  I wonder if her symptoms could be pyelonephritis.  Will start on antibiotics.  She is feeling quite a bit better and so we will try to discharge her home.  PCP follow-up.   Deno Etienne, DO 12/08/20 [phone]

## 2020-12-08 NOTE — ED Triage Notes (Signed)
Pt c/o severe ABD pain that radiates to mid back. Pain started 2 hours ago. Pt denies n/v. Pt endorse SOB and diaphoresis.

## 2020-12-08 NOTE — Discharge Instructions (Signed)
Take 4 over the counter ibuprofen tablets 3 times a day or 2 over-the-counter naproxen tablets twice a day for pain. Also take tylenol 1000mg (2 extra strength) four times a day.  REturn for worsening pain, fever, inability to eat or drink.

## 2021-07-08 ENCOUNTER — Other Ambulatory Visit: Payer: Self-pay

## 2021-07-08 ENCOUNTER — Emergency Department (HOSPITAL_BASED_OUTPATIENT_CLINIC_OR_DEPARTMENT_OTHER): Payer: 59

## 2021-07-08 ENCOUNTER — Encounter (HOSPITAL_BASED_OUTPATIENT_CLINIC_OR_DEPARTMENT_OTHER): Payer: Self-pay

## 2021-07-08 ENCOUNTER — Emergency Department (HOSPITAL_BASED_OUTPATIENT_CLINIC_OR_DEPARTMENT_OTHER)
Admission: EM | Admit: 2021-07-08 | Discharge: 2021-07-08 | Disposition: A | Discharge status: Home / Self Care | Payer: 59 | Attending: Emergency Medicine | Admitting: Emergency Medicine

## 2021-07-08 DIAGNOSIS — M79605 Pain in left leg: Secondary | ICD-10-CM | POA: Insufficient documentation

## 2021-07-08 DIAGNOSIS — Z91040 Latex allergy status: Secondary | ICD-10-CM | POA: Insufficient documentation

## 2021-07-08 DIAGNOSIS — R200 Anesthesia of skin: Secondary | ICD-10-CM | POA: Insufficient documentation

## 2021-07-08 DIAGNOSIS — X58XXXA Exposure to other specified factors, initial encounter: Secondary | ICD-10-CM | POA: Diagnosis not present

## 2021-07-08 DIAGNOSIS — R748 Abnormal levels of other serum enzymes: Secondary | ICD-10-CM | POA: Insufficient documentation

## 2021-07-08 DIAGNOSIS — R3121 Asymptomatic microscopic hematuria: Secondary | ICD-10-CM | POA: Insufficient documentation

## 2021-07-08 DIAGNOSIS — R7401 Elevation of levels of liver transaminase levels: Secondary | ICD-10-CM | POA: Insufficient documentation

## 2021-07-08 DIAGNOSIS — S301XXA Contusion of abdominal wall, initial encounter: Secondary | ICD-10-CM | POA: Insufficient documentation

## 2021-07-08 DIAGNOSIS — S3991XA Unspecified injury of abdomen, initial encounter: Secondary | ICD-10-CM | POA: Diagnosis present

## 2021-07-08 LAB — CBC
HCT: 45 % (ref 36.0–46.0)
Hemoglobin: 14.4 g/dL (ref 12.0–15.0)
MCH: 26.5 pg (ref 26.0–34.0)
MCHC: 32 g/dL (ref 30.0–36.0)
MCV: 82.7 fL (ref 80.0–100.0)
Platelets: 283 10*3/uL (ref 150–400)
RBC: 5.44 MIL/uL — ABNORMAL HIGH (ref 3.87–5.11)
RDW: 18.4 % — ABNORMAL HIGH (ref 11.5–15.5)
WBC: 10.3 10*3/uL (ref 4.0–10.5)
nRBC: 0 % (ref 0.0–0.2)

## 2021-07-08 LAB — PREGNANCY, URINE: Preg Test, Ur: NEGATIVE

## 2021-07-08 LAB — URINALYSIS, MICROSCOPIC (REFLEX)

## 2021-07-08 LAB — COMPREHENSIVE METABOLIC PANEL
ALT: 379 U/L — ABNORMAL HIGH (ref 0–44)
AST: 160 U/L — ABNORMAL HIGH (ref 15–41)
Albumin: 3.5 g/dL (ref 3.5–5.0)
Alkaline Phosphatase: 69 U/L (ref 38–126)
Anion gap: 10 (ref 5–15)
BUN: 9 mg/dL (ref 6–20)
CO2: 24 mmol/L (ref 22–32)
Calcium: 9.4 mg/dL (ref 8.9–10.3)
Chloride: 103 mmol/L (ref 98–111)
Creatinine, Ser: 0.76 mg/dL (ref 0.44–1.00)
GFR, Estimated: >60 mL/min (ref >60)
Glucose, Bld: 97 mg/dL (ref 70–99)
Potassium: 3.5 mmol/L (ref 3.5–5.1)
Sodium: 137 mmol/L (ref 135–145)
Total Bilirubin: 0.5 mg/dL (ref 0.3–1.2)
Total Protein: 7.8 g/dL (ref 6.5–8.1)

## 2021-07-08 LAB — URINALYSIS, ROUTINE W REFLEX MICROSCOPIC
Glucose, UA: NEGATIVE mg/dL
Ketones, ur: 15 mg/dL — AB
Nitrite: NEGATIVE
Protein, ur: 30 mg/dL — AB
Specific Gravity, Urine: 1.02 (ref 1.005–1.030)
pH: 6 (ref 5.0–8.0)

## 2021-07-08 NOTE — ED Triage Notes (Addendum)
Pt is post-op from bariatric sx and hernia repair on 4/19. Pt states after she stopped her blood thinner injections 2 weeks ago she started having "numbness" across her lower abd and on the anterolateral portion of her thighs bilaterally that is gradually worsening. Pt reports associated "minimal swelling" to her L leg. Pt denies CP or ShOB.

## 2021-07-08 NOTE — ED Provider Notes (Signed)
[place] EMERGENCY DEPARTMENT Provider Note   CSN: [id number] Arrival date & time: 07/08/21  1257     History  Chief Complaint  Patient presents with   Numbness         Kathleen Andrews is a 36 y.o. female who presents the emergency department complaining of a sensation of numbness in her abdomen.  She recently had bariatric surgery last month, with a Psychologist, sport and exercise at [facility] in [place].  She states that she has been having decree sensation in her lower abdomen, as well as her bilateral legs.  On her left leg is sensation goes down to her calf and feels like "a band".  Discussed with her surgeon, and they originally thought that some of the numbness was associated with a hematoma that she had after Lovenox injections.  They recommended that she take Benadryl, and so this would resolve on its own.  They also wanted her to go to [facility] for an ultrasound of her leg to rule out a blood clot.  She did not do so, and states the symptoms have been getting slightly worse.  She denies any pain, or difficulty breathing.  HPI     Home Medications Prior to Admission medications   Medication Sig Start Date End Date Taking? Authorizing Provider  Cholecalciferol (VITAMIN D PO) Take by mouth.    [provider]  Cyanocobalamin (VITAMIN B-12 IJ) Inject as directed.    [provider]  eletriptan (RELPAX) 20 MG tablet TAKE 1 TABLET (20 MG TOTAL) BY MOUTH AS NEEDED FOR MIGRAINE. MAY REPEAT IN 2 HOURS IF NECESSARY 01/06/17   [provider]  FERROUS SULFATE PO Take by mouth.    [provider]  ondansetron (ZOFRAN ODT) 4 MG disintegrating tablet '4mg'$  ODT q4 hours prn nausea/vomit 12/08/20   Deno Etienne, DO  sertraline (ZOLOFT) 100 MG tablet TAKE ALONG WITH A '50MG'$  TABLET FOR A TOTAL OF '150MG'$  DAILY 01/06/17   [provider]  sulfamethoxazole-trimethoprim (BACTRIM DS,SEPTRA DS) 800-160 MG tablet Take 1 tablet by mouth 2 (two) times daily. 09/21/17   Lajean Manes, CNM      Allergies    Eggs or egg-derived products, Tomato, and Latex    Review of Systems   Review of Systems  Respiratory:  Negative for shortness of breath.   Cardiovascular:  Positive for leg swelling. Negative for chest pain.  Gastrointestinal:  Negative for abdominal pain, diarrhea, nausea and vomiting.  Musculoskeletal:  Negative for arthralgias and myalgias.  Neurological:  Positive for numbness. Negative for tremors and weakness.  All other systems reviewed and are negative.  Physical Exam Updated Vital Signs BP (!) 134/96 (BP Location: Right Arm)   Pulse 93   Temp 99 F (37.2 C) (Oral)   Resp 18   Ht 5' 5.5" (1.664 m)   Wt (!) 154.7 kg   LMP 06/03/2021   SpO2 100%   BMI 55.88 kg/m  Physical Exam Vitals and nursing note reviewed.  Constitutional:      Appearance: Normal appearance.  HENT:     Head: Normocephalic and atraumatic.  Eyes:     Conjunctiva/sclera: Conjunctivae normal.  Cardiovascular:     Rate and Rhythm: Normal rate and regular rhythm.     Pulses:          Popliteal pulses are 2+ on the right side and 2+ on the left side.     Comments: No significant edema in the left leg compared to the right.  Compartments  are soft, nontender to palpation.  No overlying skin changes. Pulmonary:     Effort: Pulmonary effort is normal. No respiratory distress.     Breath sounds: Normal breath sounds.  Abdominal:     General: There is no distension.     Palpations: Abdomen is soft.     Tenderness: There is no abdominal tenderness.     Comments: Decreased sensation noted just above the umbilicus, bilaterally down to her lower abdomen.  Hematoma noted under the skin to deep palpation of the left lower quadrant.  Increased erythema and maceration noted to the abdominal pelvic skin fold, consistent with yeast infection which patient is currently treating.  Small well-healed surgical scars.  Skin:    General: Skin is warm and dry.  Neurological:      General: No focal deficit present.     Mental Status: She is alert.    ED Results / Procedures / Treatments   Labs (all labs ordered are listed, but only abnormal results are displayed) Labs Reviewed  COMPREHENSIVE METABOLIC PANEL - Abnormal; Notable for the following components:      Result Value   AST 160 (*)    ALT 379 (*)    All other components within normal limits  CBC - Abnormal; Notable for the following components:   RBC 5.44 (*)    RDW 18.4 (*)    All other components within normal limits  URINALYSIS, ROUTINE W REFLEX MICROSCOPIC - Abnormal; Notable for the following components:   APPearance HAZY (*)    Hgb urine dipstick LARGE (*)    Bilirubin Urine SMALL (*)    Ketones, ur 15 (*)    Protein, ur 30 (*)    Leukocytes,Ua TRACE (*)    All other components within normal limits  URINALYSIS, MICROSCOPIC (REFLEX) - Abnormal; Notable for the following components:   Bacteria, UA MANY (*)    All other components within normal limits  HEPATITIS PANEL, ACUTE  PREGNANCY, URINE    EKG None  Radiology CT ABDOMEN PELVIS WO CONTRAST  Result Date: 07/08/2021 CLINICAL DATA:  Flank pain, kidney stone suspected EXAM: CT ABDOMEN AND PELVIS WITHOUT CONTRAST TECHNIQUE: Multidetector CT imaging of the abdomen and pelvis was performed following the standard protocol without IV contrast. RADIATION DOSE REDUCTION: This exam was performed according to the departmental dose-optimization program which includes automated exposure control, adjustment of the mA and/or kV according to patient size and/or use of iterative reconstruction technique. COMPARISON:  None Available. FINDINGS: Lower chest: No acute abnormality Hepatobiliary: Small subcentimeter hypodensity centrally in the liver likely a small cyst, stable since prior study. Layering high-density material within the gallbladder could reflect sludge or stones. No biliary ductal dilatation. Pancreas: No focal abnormality or ductal dilatation.  Spleen: No focal abnormality.  Normal size. Adrenals/Urinary Tract: No adrenal abnormality. No focal renal abnormality. No stones or hydronephrosis. Urinary bladder is unremarkable. Stomach/Bowel: Normal appendix. Stomach, large and small bowel grossly unremarkable. Prior gastric bypass Vascular/Lymphatic: No evidence of aneurysm or adenopathy. Reproductive: No adrenal abnormality. No focal renal abnormality. No stones or hydronephrosis. Urinary bladder is unremarkable. Other: No free fluid or free air. Musculoskeletal: No acute bony abnormality. IMPRESSION: No acute findings in the abdomen or pelvis. Subtle high-density material layering within the gallbladder could reflect stones or sludge. Prior gastric bypass.  No complicating feature. Electronically Signed   By: Rolm Baptise M.D.   On: 07/08/2021 19:04   US Venous Img Lower Unilateral Left (DVT)  Result Date: 07/08/2021 CLINICAL DATA:  Left lower extremity pain. EXAM: LEFT LOWER EXTREMITY VENOUS DOPPLER ULTRASOUND TECHNIQUE: Gray-scale sonography with graded compression, as well as color Doppler and duplex ultrasound were performed to evaluate the lower extremity deep venous systems from the level of the common femoral vein and including the common femoral, femoral, profunda femoral, popliteal and calf veins including the posterior tibial, peroneal and gastrocnemius veins when visible. The superficial great saphenous vein was also interrogated. Spectral Doppler was utilized to evaluate flow at rest and with distal augmentation maneuvers in the common femoral, femoral and popliteal veins. COMPARISON:  None Available. FINDINGS: Contralateral Common Femoral Vein: Respiratory phasicity is normal and symmetric with the symptomatic side. No evidence of thrombus. Normal compressibility. Common Femoral Vein: No evidence of thrombus. Normal compressibility, respiratory phasicity and response to augmentation. Saphenofemoral Junction: No evidence of thrombus. Normal  compressibility and flow on color Doppler imaging. Profunda Femoral Vein: No evidence of thrombus. Normal compressibility and flow on color Doppler imaging. Femoral Vein: No evidence of thrombus. Normal compressibility, respiratory phasicity and response to augmentation. Popliteal Vein: No evidence of thrombus. Normal compressibility, respiratory phasicity and response to augmentation. Calf Veins: No evidence of thrombus. Normal compressibility and flow on color Doppler imaging. Superficial Great Saphenous Vein: No evidence of thrombus. Normal compressibility. Venous Reflux:  None. Other Findings: No evidence of superficial thrombophlebitis or abnormal fluid collection. IMPRESSION: No evidence of left lower extremity deep venous thrombosis. Electronically Signed   By: Aletta Edouard M.D.   On: 07/08/2021 17:33    Procedures Procedures    Medications Ordered in ED Medications - No data to display  ED Course/ Medical Decision Making/ A&P                           Medical Decision Making Amount and/or Complexity of Data Reviewed Labs: ordered. Radiology: ordered.   This patient is a 36 y.o. female who presents to the ED for abdominal and bilateral leg numbness.   Past Medical History / Co-morbidities / Social History: Recurrent UTI, urge incontinence, previous C-section, kidney stones, GERD  Additional history: Chart reviewed. Pertinent results include: pre-operative laboratory evaluation from [facility] viewed which showed mildly elevated liver enzymes.   Physical Exam: Physical exam performed. The pertinent findings include: Normal vital signs. Abdomen soft and non-tender. Reported decreased sensation in lower half of abdomen. Hematoma noted to deep palpation of LLQ. Good distal pulses in BLE. No overlying skin changes to abdomen or legs.   Lab Tests: I ordered, and personally interpreted labs.  The pertinent results include:  no leukocytosis. Normal hemoglobin. AST 160, ALT 379. Electrolytes  WNL. Urinalysis with large hemoglobin, small bilirubin, trace leukocytes. Urine pregnancy negative. Hepatitis panel pending at time of shift change.    Imaging Studies: I ordered imaging studies including venous US and CT abdomen pelvis. I independently visualized and interpreted imaging which showed no evidence of DVT, CT pending at time of shift change. I agree with the radiologist interpretation.   Disposition: After consideration of the diagnostic results and the patients response to treatment, I feel that patient is likely not requiring admission. Patient discussed and care transferred to [facility] at shift change. Please see his/her note for further details regarding further ED course and disposition. Plan at time of handoff is await CT scan for possible kidney stone. If patient has stone she will need to be treated accordingly, if not, she will follow up with PCP about liver labs/hepatitis panel and follow up with  the plastic surgeon about the numb sensation.  Suspect numbness is related to changes in nerve distribution after bariatric surgery or vitamin deficiencies as patient has not been cleared to start vitamin supplementation yet.   I discussed this case with my attending physician Dr. Pearline Cables who cosigned this note including patient's presenting symptoms, physical exam, and planned diagnostics and interventions. Attending physician stated agreement with plan or made changes to plan which were implemented.     Final Clinical Impression(s) / ED Diagnoses Final diagnoses:  Numbness  Left leg pain  Asymptomatic microscopic hematuria    Rx / DC Orders ED Discharge Orders     None      Portions of this report may have been transcribed using voice recognition software. Every effort was made to ensure accuracy; however, inadvertent computerized transcription errors may be present.    Calhoun Reichardt T, PA-C 49/70/26 [id number]    Campbell Stall P, DO 88/50/27 [id number]

## 2021-07-08 NOTE — ED Notes (Signed)
Numbess x 1 week  1/2 week  from waist to your knees and it started  with lump and bruise , she spoke to her surgeron was told  it was a hematoma and it would go away  . There was numbness around that spot now it has spread  , she has  not spoken to her surgeon , they wanted her to have [place] ,but surgery was in [place] and she could not get back there to have it  there in [place] .   but did  speak to her primary care and was told to come to ER .

## 2021-07-08 NOTE — Discharge Instructions (Addendum)
You were seen in the emergency department today for numbness.  As we discussed I think your symptoms are related to some of the lasting effects of the surgery that you had, as well as some vitamin deficiencies.  I recommend following up with the plastic surgeon.  The ultrasound of your leg showed no evidence of blood clots.  We also talked about your liver enzymes being elevated.  I have sent a hepatitis panel, which should result on the neck several days.  I recommend following up the results in your MyChart, and discussing them with your primary doctor.  We did a CT scan to look for kidney stones which was negative for kidney stones but did show signs that you may have gallstones.  Although not symptomatic at all, the medical history problems in the future. Please not hesitate to return to emergency department if the worrisome signs and symptoms we discussed become apparent.

## 2021-07-08 NOTE — ED Notes (Signed)
Pt A&Ox4 ambulatory at d/c with independent steady gait. Pt verbalized understanding of d/c instructions and follow up care. Pt declined offer for wheelchair.

## 2021-07-08 NOTE — ED Notes (Signed)
Pt has very strong opinions about having an  IV stating I have lumps from IV, I can be stuck every time for my labs but NO IV !  I told her I was going to chart that she did not want the IV

## 2021-07-09 LAB — HEPATITIS PANEL, ACUTE
HCV Ab: NONREACTIVE
Hep A IgM: NONREACTIVE
Hep B C IgM: NONREACTIVE
Hepatitis B Surface Ag: NONREACTIVE

## 2021-07-09 NOTE — ED Provider Notes (Signed)
See prior full HPI from [employer], PA-C. In short, patient complains of decreased sensation to abdomen after bariatric surgery last month as well as band like sensation in her calf. Her surgeons think the abdominal numbness is likely secondary to hematoma and wanted to perform an [place] to r/o DVT.   Physical Exam  BP (!) 134/96 (BP Location: Right Arm)   Pulse 93   Temp 99 F (37.2 C) (Oral)   Resp 18   Ht 5' 5.5" (1.664 m)   Wt (!) 154.7 kg   LMP 06/03/2021   SpO2 100%   BMI 55.88 kg/m   Physical Exam Vitals and nursing note reviewed.  Constitutional:      General: She is not in acute distress.    Appearance: She is well-developed.  HENT:     Head: Normocephalic and atraumatic.  Eyes:     Conjunctiva/sclera: Conjunctivae normal.  Cardiovascular:     Rate and Rhythm: Normal rate and regular rhythm.     Heart sounds: No murmur heard. Pulmonary:     Effort: Pulmonary effort is normal. No respiratory distress.     Breath sounds: Normal breath sounds.  Abdominal:     General: Bowel sounds are normal.     Palpations: Abdomen is soft.     Tenderness: There is no abdominal tenderness.  Musculoskeletal:        General: No swelling or signs of injury. Normal range of motion.     Cervical back: Neck supple.     Right lower leg: No edema.     Left lower leg: No edema.  Skin:    General: Skin is warm and dry.     Capillary Refill: Capillary refill takes less than 2 seconds.  Neurological:     General: No focal deficit present.     Mental Status: She is alert and oriented to person, place, and time.     Motor: No weakness or tremor.     Comments: Numbness  Psychiatric:        Mood and Affect: Mood normal.        Behavior: Behavior normal.    Procedures  Procedures  ED Course / MDM    Medical Decision Making Amount and/or Complexity of Data Reviewed Labs: ordered. Radiology: ordered.     Test / Admission - Considered:  Numbness Patient most concerned about  potential kidney stone when made known of asymptomatic urinary tract infection. CT renal stone study negative [place] negative for DVT. Numbness in abdomen is most likely related to nerves affected during bariatric surgery or possible vitamin deficiency given that she has not started supplementation yet. Close follow up recommended with her surgeon regarding numbness for further treatment/care Worrisome signs and symptoms were discussed with the patient, and the patient acknowledged understanding to return to the ED if noticed. Patient was stable upon discharge.         Wilnette Kales, [place] 78/46/96 [id number]    Lianne Cure, DO 84/13/24 [id number]

## 2023-07-16 ENCOUNTER — Emergency Department (HOSPITAL_BASED_OUTPATIENT_CLINIC_OR_DEPARTMENT_OTHER)
Admission: EM | Admit: 2023-07-16 | Discharge: 2023-07-16 | Discharge status: Against Medical Advice | Attending: Emergency Medicine | Admitting: Emergency Medicine

## 2023-07-16 ENCOUNTER — Other Ambulatory Visit: Payer: Self-pay

## 2023-07-16 ENCOUNTER — Encounter (HOSPITAL_BASED_OUTPATIENT_CLINIC_OR_DEPARTMENT_OTHER): Payer: Self-pay | Admitting: Emergency Medicine

## 2023-07-16 DIAGNOSIS — Z5321 Procedure and treatment not carried out due to patient leaving prior to being seen by health care provider: Secondary | ICD-10-CM | POA: Diagnosis not present

## 2023-07-16 DIAGNOSIS — M79604 Pain in right leg: Secondary | ICD-10-CM | POA: Diagnosis present

## 2023-07-16 DIAGNOSIS — R0602 Shortness of breath: Secondary | ICD-10-CM | POA: Diagnosis not present

## 2023-07-16 NOTE — ED Triage Notes (Signed)
 Pt sts she had a tummy tuck  (in [place]) about 1 month ago; developed RLE pain 2 days ago, [facility] today,

## 2023-07-16 NOTE — ED Notes (Signed)
 Writer brought patient back to a room and asked her if we could get an IV started and obtain some basic blood work. Pt immediately turned around stating "oh no, I don't do needles. I'm fine" and began walking out of department. Writer attempted to tell patient that we will never force tests on her and asked if she'd at least be willing to wait to speak with the provider. Pt repeated "on no, I'm fine" and continued walking out of the department. Writer confirmed with registration 5 minutes later that patient did in fact leave. Provider notified.
